# Patient Record
Sex: Female | Born: 1955 | Race: Black or African American | Hispanic: No | Marital: Married | Smoking: Never smoker
Health system: Southern US, Community
[De-identification: ages and names within clinical notes are randomized; demographics above are authoritative.]

## PROBLEM LIST (undated history)

## (undated) DIAGNOSIS — G5793 Unspecified mononeuropathy of bilateral lower limbs: Secondary | ICD-10-CM

## (undated) DIAGNOSIS — C801 Malignant (primary) neoplasm, unspecified: Secondary | ICD-10-CM

## (undated) DIAGNOSIS — R600 Localized edema: Secondary | ICD-10-CM

## (undated) DIAGNOSIS — K567 Ileus, unspecified: Secondary | ICD-10-CM

## (undated) DIAGNOSIS — R188 Other ascites: Secondary | ICD-10-CM

## (undated) DIAGNOSIS — E7800 Pure hypercholesterolemia, unspecified: Secondary | ICD-10-CM

## (undated) DIAGNOSIS — I10 Essential (primary) hypertension: Secondary | ICD-10-CM

## (undated) DIAGNOSIS — K219 Gastro-esophageal reflux disease without esophagitis: Secondary | ICD-10-CM

## (undated) DIAGNOSIS — M722 Plantar fascial fibromatosis: Secondary | ICD-10-CM

## (undated) HISTORY — DX: Plantar fascial fibromatosis: M72.2

## (undated) HISTORY — PX: HAND LIGAMENT RECONSTRUCTION: SHX1726

## (undated) HISTORY — DX: Localized edema: R60.0

---

## 2002-07-30 ENCOUNTER — Emergency Department (HOSPITAL_COMMUNITY): Admission: EM | Admit: 2002-07-30 | Discharge: 2002-07-30 | Payer: Self-pay | Admitting: Emergency Medicine

## 2002-07-30 ENCOUNTER — Encounter: Payer: Self-pay | Admitting: Emergency Medicine

## 2004-09-07 ENCOUNTER — Emergency Department (HOSPITAL_COMMUNITY): Admission: EM | Admit: 2004-09-07 | Discharge: 2004-09-08 | Payer: Self-pay | Admitting: Emergency Medicine

## 2010-02-02 ENCOUNTER — Emergency Department (HOSPITAL_COMMUNITY): Admission: EM | Admit: 2010-02-02 | Discharge: 2010-02-02 | Payer: Self-pay | Admitting: Emergency Medicine

## 2010-09-22 ENCOUNTER — Ambulatory Visit: Payer: Self-pay

## 2011-01-15 ENCOUNTER — Emergency Department (HOSPITAL_COMMUNITY)
Admission: EM | Admit: 2011-01-15 | Discharge: 2011-01-15 | Disposition: A | Discharge status: Home / Self Care | Payer: BC Managed Care – PPO | Attending: Emergency Medicine | Admitting: Emergency Medicine

## 2011-01-15 DIAGNOSIS — H9209 Otalgia, unspecified ear: Secondary | ICD-10-CM | POA: Insufficient documentation

## 2011-01-15 DIAGNOSIS — J329 Chronic sinusitis, unspecified: Secondary | ICD-10-CM | POA: Insufficient documentation

## 2011-01-15 DIAGNOSIS — J3489 Other specified disorders of nose and nasal sinuses: Secondary | ICD-10-CM | POA: Insufficient documentation

## 2011-01-15 DIAGNOSIS — R070 Pain in throat: Secondary | ICD-10-CM | POA: Insufficient documentation

## 2011-01-15 DIAGNOSIS — R51 Headache: Secondary | ICD-10-CM | POA: Insufficient documentation

## 2011-01-15 DIAGNOSIS — H5789 Other specified disorders of eye and adnexa: Secondary | ICD-10-CM | POA: Insufficient documentation

## 2011-01-15 DIAGNOSIS — Z79899 Other long term (current) drug therapy: Secondary | ICD-10-CM | POA: Insufficient documentation

## 2011-01-15 DIAGNOSIS — I10 Essential (primary) hypertension: Secondary | ICD-10-CM | POA: Insufficient documentation

## 2011-01-15 DIAGNOSIS — E119 Type 2 diabetes mellitus without complications: Secondary | ICD-10-CM | POA: Insufficient documentation

## 2011-01-15 LAB — RAPID STREP SCREEN (MED CTR MEBANE ONLY): Streptococcus, Group A Screen (Direct): NEGATIVE

## 2011-11-09 ENCOUNTER — Encounter (HOSPITAL_COMMUNITY): Payer: Self-pay | Admitting: Emergency Medicine

## 2011-11-09 ENCOUNTER — Emergency Department (HOSPITAL_COMMUNITY)
Admission: EM | Admit: 2011-11-09 | Discharge: 2011-11-09 | Disposition: A | Discharge status: Home / Self Care | Payer: BC Managed Care – PPO | Attending: Emergency Medicine | Admitting: Emergency Medicine

## 2011-11-09 DIAGNOSIS — E119 Type 2 diabetes mellitus without complications: Secondary | ICD-10-CM | POA: Insufficient documentation

## 2011-11-09 DIAGNOSIS — M792 Neuralgia and neuritis, unspecified: Secondary | ICD-10-CM

## 2011-11-09 DIAGNOSIS — Z79899 Other long term (current) drug therapy: Secondary | ICD-10-CM | POA: Insufficient documentation

## 2011-11-09 DIAGNOSIS — I10 Essential (primary) hypertension: Secondary | ICD-10-CM | POA: Insufficient documentation

## 2011-11-09 DIAGNOSIS — E7800 Pure hypercholesterolemia, unspecified: Secondary | ICD-10-CM | POA: Insufficient documentation

## 2011-11-09 DIAGNOSIS — IMO0002 Reserved for concepts with insufficient information to code with codable children: Secondary | ICD-10-CM | POA: Insufficient documentation

## 2011-11-09 DIAGNOSIS — R079 Chest pain, unspecified: Secondary | ICD-10-CM | POA: Insufficient documentation

## 2011-11-09 DIAGNOSIS — R0602 Shortness of breath: Secondary | ICD-10-CM | POA: Insufficient documentation

## 2011-11-09 HISTORY — DX: Pure hypercholesterolemia, unspecified: E78.00

## 2011-11-09 HISTORY — DX: Essential (primary) hypertension: I10

## 2011-11-09 LAB — BASIC METABOLIC PANEL
BUN: 11 mg/dL (ref 6–23)
Chloride: 94 mEq/L — ABNORMAL LOW (ref 96–112)
Glucose, Bld: 127 mg/dL — ABNORMAL HIGH (ref 70–99)
Potassium: 3.3 mEq/L — ABNORMAL LOW (ref 3.5–5.1)

## 2011-11-09 LAB — PRO B NATRIURETIC PEPTIDE: Pro B Natriuretic peptide (BNP): 9.1 pg/mL (ref 0–125)

## 2011-11-09 LAB — CBC
HCT: 39.6 % (ref 36.0–46.0)
Hemoglobin: 14.2 g/dL (ref 12.0–15.0)
RBC: 4.59 MIL/uL (ref 3.87–5.11)
WBC: 5.7 10*3/uL (ref 4.0–10.5)

## 2011-11-09 LAB — GLUCOSE, CAPILLARY: Glucose-Capillary: 137 mg/dL — ABNORMAL HIGH (ref 70–99)

## 2011-11-09 MED ORDER — PREDNISONE 20 MG PO TABS: 20.0000 mg | ORAL_TABLET | Freq: Every day | ORAL | Status: AC

## 2011-11-09 MED ORDER — HYDROCODONE-ACETAMINOPHEN 5-500 MG PO TABS: 1.0000 | ORAL_TABLET | Freq: Four times a day (QID) | ORAL | Status: AC | PRN

## 2011-11-09 NOTE — ED Notes (Signed)
Pt c/o left sided arm and neck pain with pain in upper back and into chest x 3 weeks; pt sts became more severe today with some hand numbness; no weakness in grip strength

## 2011-11-09 NOTE — ED Notes (Signed)
Pt states that she has had numb fingers on her L hand for several months.  The week she began experiencing L shoulder "throbbing" that sometimes radiated down to her L elbow and up into her L neck.  Pain does not increase with movement and is not relieved with 1600 mg of ibuprofen.  Strong grips on each side.  Pt does not feel sob is r/t increased pain.

## 2011-11-09 NOTE — Discharge Instructions (Signed)
Be sure to check your sugars more frequently while on the prednisone as this can increase your sugars.

## 2011-11-09 NOTE — ED Provider Notes (Signed)
History     CSN: [id number]  Arrival date & time 11/09/11  1559   First MD Initiated Contact with Patient 11/09/11 2113      Chief Complaint  Patient presents with  . Chest Pain  . Arm Pain    (Consider location/radiation/quality/duration/timing/severity/associated sxs/prior treatment) Patient is a 56 y.o. female presenting with chest pain.  Chest Pain The chest pain began 6 - 12 hours ago. Chest pain occurs frequently. The chest pain is unchanged. The pain is currently at 5/10. The severity of the pain is moderate. The quality of the pain is described as squeezing. The pain does not radiate. Primary symptoms include shortness of breath and cough. Pertinent negatives for primary symptoms include no fever, no abdominal pain, no nausea and no vomiting.  Pertinent negatives for associated symptoms include no weakness. She tried nothing for the symptoms.     Past Medical History  Diagnosis Date  . Diabetes mellitus   . Hypertension   . Hypercholesterolemia     History reviewed. No pertinent past surgical history.  History reviewed. No pertinent family history.  History  Substance Use Topics  . Smoking status: Never Smoker   . Smokeless tobacco: Not on file  . Alcohol Use: No    OB History    Grav Para Term Preterm Abortions TAB SAB Ect Mult Living                  Review of Systems  Constitutional: Negative for fever and chills.  HENT: Negative for congestion and rhinorrhea.   Respiratory: Positive for cough and shortness of breath.   Cardiovascular: Positive for chest pain. Negative for leg swelling.  Gastrointestinal: Negative for nausea, vomiting, abdominal pain and constipation.  Genitourinary: Negative for urgency, decreased urine volume and difficulty urinating.  Skin: Negative for wound.  Neurological: Negative for weakness, light-headedness and headaches.       Tingling throughout left arm  Psychiatric/Behavioral: Negative for confusion.  All other  systems reviewed and are negative.    Allergies  Review of patient's allergies indicates no known allergies.  Home Medications   Current Outpatient Rx  Name Route Sig Dispense Refill  . ATORVASTATIN CALCIUM 20 MG PO TABS Oral Take 20 mg by mouth daily.    Marland Kitchen EXENATIDE 2 MG [place] SUSR Subcutaneous Inject 2 mg into the skin once a week. Every monday    . GLIMEPIRIDE 4 MG PO TABS Oral Take 4 mg by mouth daily before breakfast.    . IBUPROFEN 200 MG PO TABS Oral Take 600 mg by mouth every 6 (six) hours as needed. For pain    . INSULIN GLARGINE 100 UNIT/ML  SOLN Subcutaneous Inject 44 Units into the skin 2 (two) times daily.    Marland Kitchen LISINOPRIL-HYDROCHLOROTHIAZIDE 20-25 MG PO TABS Oral Take 1 tablet by mouth daily.      BP 120/77  Pulse 84  Temp(Src) 98.4 F (36.9 C) (Oral)  Resp 20  SpO2 98%  Physical Exam  Vitals reviewed. Constitutional: She is oriented to person, place, and time. She appears well-developed and well-nourished. No distress.  HENT:  Head: Normocephalic and atraumatic.  Right Ear: External ear normal.  Left Ear: External ear normal.  Nose: Nose normal.  Mouth/Throat: Oropharynx is clear and moist.  Neck: Neck supple.  Cardiovascular: Normal rate, regular rhythm, normal heart sounds and intact distal pulses.   Pulmonary/Chest: Effort normal and breath sounds normal. No respiratory distress. She has no wheezes. She has no rales. She exhibits no  tenderness.  Abdominal: Soft. She exhibits no distension. There is no tenderness.  Musculoskeletal: She exhibits no edema.       Left shoulder: She exhibits tenderness. She exhibits no bony tenderness.       Left elbow: She exhibits normal range of motion, no swelling and no deformity. tenderness (soft tissue) found.       Left hand: decreased sensation noted. Decreased sensation is present in the ulnar distribution and is present in the medial distribution.  Lymphadenopathy:    She has no cervical adenopathy.  Neurological:  She is alert and oriented to person, place, and time.  Skin: Skin is warm and dry. She is not diaphoretic. No pallor.    ED Course  Procedures (including critical care time)  Labs Reviewed  BASIC METABOLIC PANEL - Abnormal; Notable for the following:    Sodium 134 (*)    Potassium 3.3 (*)    Chloride 94 (*)    Glucose, Bld 127 (*)    All other components within normal limits  GLUCOSE, CAPILLARY - Abnormal; Notable for the following:    Glucose-Capillary 137 (*)    All other components within normal limits  CBC  TROPONIN I  PRO B NATRIURETIC PEPTIDE  PRO B NATRIURETIC PEPTIDE   No results found.   Date: 11/09/2011  Rate: 99  Rhythm: normal sinus rhythm  QRS Axis: normal  Intervals: normal  ST/T Wave abnormalities: T flattening in V4-V6  Conduction Disutrbances:none  Narrative Interpretation:   Old EKG Reviewed: unchanged   1. Radicular pain in left arm       MDM  56 yo female with left arm/shoulder pain x 2 weeks, now c/o chest pain intermittently today. Not exertional. CP worse with left upper extremity movement. Intermittent left finger numbness (worse in medial 2 digits, but occasionally from index finger to pinky). Mild neck pain as well. EKG shows no ischemia, and with over 8 hours of pain and a neg trop, doubt ACS. Likely related to her left arm pain, which seems c/w cervical radiculopathy with sensory changes, intermittent pain and numbness. No acute indication to image neck, strength intact. Will d/c home with steroid burst (alerted patient to pay attention to glucose due to being a diabetic) and po pain meds. Discussed follow up with her PCP for further workup for cervical radiculopathy.        Pricilla Loveless, MD 11/09/11 [phone]

## 2011-11-10 NOTE — ED Provider Notes (Signed)
I saw and evaluated the patient, reviewed the resident's note and I agree with the findings and plan.  I saw the patient along with Dr. Criss Alvine and agree with his noted, assessment, and plan.  The patient presents complaining of numbness in the left hand and arm that has been occurring for the past 2-3 weeks.  She denies chest pain or shortness of breath.  She does report some neck discomfort.  On exam, the ekg and enzymes were unremarkable.  The strength was symmetrical in both arms and there were no neuro deficits.  I believe the patient's symptoms are more likely radicular in nature and not cardiac.  She will be treated with prednisone as an anti-inflammatory as well as pain medication and given time.  If she does not improve in one week, or her symptoms change, she is to either follow up with her doctor or return to the ED.  Geoffery Lyons, MD 11/10/11 1246

## 2015-08-22 ENCOUNTER — Encounter (HOSPITAL_COMMUNITY): Payer: Self-pay | Admitting: Emergency Medicine

## 2015-08-22 ENCOUNTER — Emergency Department (HOSPITAL_COMMUNITY): Payer: BC Managed Care – PPO

## 2015-08-22 ENCOUNTER — Emergency Department (HOSPITAL_COMMUNITY)
Admission: EM | Admit: 2015-08-22 | Discharge: 2015-08-22 | Disposition: A | Discharge status: Home / Self Care | Payer: BC Managed Care – PPO | Attending: Emergency Medicine | Admitting: Emergency Medicine

## 2015-08-22 DIAGNOSIS — S0993XA Unspecified injury of face, initial encounter: Secondary | ICD-10-CM | POA: Diagnosis not present

## 2015-08-22 DIAGNOSIS — Y998 Other external cause status: Secondary | ICD-10-CM | POA: Insufficient documentation

## 2015-08-22 DIAGNOSIS — Z79899 Other long term (current) drug therapy: Secondary | ICD-10-CM | POA: Diagnosis not present

## 2015-08-22 DIAGNOSIS — S79922A Unspecified injury of left thigh, initial encounter: Secondary | ICD-10-CM | POA: Diagnosis not present

## 2015-08-22 DIAGNOSIS — Y92410 Unspecified street and highway as the place of occurrence of the external cause: Secondary | ICD-10-CM | POA: Diagnosis not present

## 2015-08-22 DIAGNOSIS — Z7984 Long term (current) use of oral hypoglycemic drugs: Secondary | ICD-10-CM | POA: Diagnosis not present

## 2015-08-22 DIAGNOSIS — I10 Essential (primary) hypertension: Secondary | ICD-10-CM | POA: Diagnosis not present

## 2015-08-22 DIAGNOSIS — Y9389 Activity, other specified: Secondary | ICD-10-CM | POA: Insufficient documentation

## 2015-08-22 DIAGNOSIS — M79604 Pain in right leg: Secondary | ICD-10-CM

## 2015-08-22 DIAGNOSIS — E119 Type 2 diabetes mellitus without complications: Secondary | ICD-10-CM | POA: Diagnosis not present

## 2015-08-22 DIAGNOSIS — S6991XA Unspecified injury of right wrist, hand and finger(s), initial encounter: Secondary | ICD-10-CM | POA: Insufficient documentation

## 2015-08-22 DIAGNOSIS — M79641 Pain in right hand: Secondary | ICD-10-CM

## 2015-08-22 DIAGNOSIS — Z794 Long term (current) use of insulin: Secondary | ICD-10-CM | POA: Diagnosis not present

## 2015-08-22 MED ORDER — HYDROCODONE-ACETAMINOPHEN 5-325 MG PO TABS: 1.0000 | ORAL_TABLET | Freq: Four times a day (QID) | ORAL | Status: DC | PRN

## 2015-08-22 MED ORDER — DIAZEPAM 2 MG PO TABS: 2.0000 mg | ORAL_TABLET | Freq: Three times a day (TID) | ORAL | Status: DC | PRN

## 2015-08-22 MED ORDER — DIAZEPAM 2 MG PO TABS
2.0000 mg | ORAL_TABLET | Freq: Once | ORAL | Status: AC
  Administered 2015-08-22: 2 mg via ORAL
  Filled 2015-08-22: qty 1

## 2015-08-22 MED ORDER — HYDROCODONE-ACETAMINOPHEN 5-325 MG PO TABS
1.0000 | ORAL_TABLET | Freq: Once | ORAL | Status: AC
  Administered 2015-08-22: 1 via ORAL
  Filled 2015-08-22: qty 1
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
## 2015-08-28 ENCOUNTER — Emergency Department (HOSPITAL_COMMUNITY): Payer: BC Managed Care – PPO

## 2015-08-28 ENCOUNTER — Emergency Department (HOSPITAL_COMMUNITY)
Admission: EM | Admit: 2015-08-28 | Discharge: 2015-08-28 | Disposition: A | Discharge status: Home / Self Care | Payer: BC Managed Care – PPO | Attending: Emergency Medicine | Admitting: Emergency Medicine

## 2015-08-28 ENCOUNTER — Encounter (HOSPITAL_COMMUNITY): Payer: Self-pay

## 2015-08-28 DIAGNOSIS — Y9389 Activity, other specified: Secondary | ICD-10-CM | POA: Insufficient documentation

## 2015-08-28 DIAGNOSIS — Y92410 Unspecified street and highway as the place of occurrence of the external cause: Secondary | ICD-10-CM | POA: Diagnosis not present

## 2015-08-28 DIAGNOSIS — S60221A Contusion of right hand, initial encounter: Secondary | ICD-10-CM | POA: Insufficient documentation

## 2015-08-28 DIAGNOSIS — Y998 Other external cause status: Secondary | ICD-10-CM | POA: Diagnosis not present

## 2015-08-28 DIAGNOSIS — Z79899 Other long term (current) drug therapy: Secondary | ICD-10-CM | POA: Diagnosis not present

## 2015-08-28 DIAGNOSIS — E119 Type 2 diabetes mellitus without complications: Secondary | ICD-10-CM | POA: Insufficient documentation

## 2015-08-28 DIAGNOSIS — M25441 Effusion, right hand: Secondary | ICD-10-CM

## 2015-08-28 DIAGNOSIS — Z794 Long term (current) use of insulin: Secondary | ICD-10-CM | POA: Insufficient documentation

## 2015-08-28 DIAGNOSIS — I10 Essential (primary) hypertension: Secondary | ICD-10-CM | POA: Diagnosis not present

## 2015-08-28 DIAGNOSIS — S6991XA Unspecified injury of right wrist, hand and finger(s), initial encounter: Secondary | ICD-10-CM | POA: Diagnosis present

## 2015-08-28 MED ORDER — OXYCODONE-ACETAMINOPHEN 5-325 MG PO TABS: 1.0000 | ORAL_TABLET | ORAL | Status: DC | PRN
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
## 2015-08-28 NOTE — ED Notes (Signed)
Patient was alert, oriented and stable upon discharge. RN went over AVS and patient had no further questions.  
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
## 2015-08-28 NOTE — Discharge Instructions (Signed)
Continue to keep her hand elevated and ice several times a day. Avoid any activity with the right hand. Ibuprofen for pain. Take Percocet for severe pain as prescribed as needed only. Please follow-up with a hand specialist as referred.  Hand Contusion A hand contusion is a deep bruise on your hand area. Contusions are the result of an injury that caused bleeding under the skin. The contusion may turn blue, purple, or yellow. Minor injuries will give you a painless contusion, but more severe contusions may stay painful and swollen for a few weeks. CAUSES  A contusion is usually caused by a blow, trauma, or direct force to an area of the body. SYMPTOMS   Swelling and redness of the injured area.  Discoloration of the injured area.  Tenderness and soreness of the injured area.  Pain. DIAGNOSIS  The diagnosis can be made by taking a history and performing a physical exam. An X-ray, CT scan, or MRI may be needed to determine if there were any associated injuries, such as broken bones (fractures). TREATMENT  Often, the best treatment for a hand contusion is resting, elevating, icing, and applying cold compresses to the injured area. Over-the-counter medicines may also be recommended for pain control. HOME CARE INSTRUCTIONS   Put ice on the injured area.  Put ice in a plastic bag.  Place a towel between your skin and the bag.  Leave the ice on for 15-20 minutes, 03-04 times a day.  Only take over-the-counter or prescription medicines as directed by your caregiver. Your caregiver may recommend avoiding anti-inflammatory medicines (aspirin, ibuprofen, and naproxen) for 48 hours because these medicines may increase bruising.  If told, use an elastic wrap as directed. This can help reduce swelling. You may remove the wrap for sleeping, showering, and bathing. If your fingers become numb, cold, or blue, take the wrap off and reapply it more loosely.  Elevate your hand with pillows to reduce  swelling.  Avoid overusing your hand if it is painful. SEEK IMMEDIATE MEDICAL CARE IF:   You have increased redness, swelling, or pain in your hand.  Your swelling or pain is not relieved with medicines.  You have loss of feeling in your hand or are unable to move your fingers.  Your hand turns cold or blue.  You have pain when you move your fingers.  Your hand becomes warm to the touch.  Your contusion does not improve in 2 days. MAKE SURE YOU:   Understand these instructions.  Will watch your condition.  Will get help right away if you are not doing well or get worse.   This information is not intended to replace advice given to you by your health care provider. Make sure you discuss any questions you have with your health care provider.   Document Released: 01/23/2002 Document Revised: 04/27/2012 Document Reviewed: 01/25/2012 Elsevier Interactive Patient Education [employer].

## 2017-08-18 ENCOUNTER — Encounter (HOSPITAL_COMMUNITY): Payer: Self-pay | Admitting: Emergency Medicine

## 2017-08-18 ENCOUNTER — Emergency Department (HOSPITAL_COMMUNITY): Payer: BC Managed Care – PPO

## 2017-08-18 DIAGNOSIS — K219 Gastro-esophageal reflux disease without esophagitis: Secondary | ICD-10-CM | POA: Insufficient documentation

## 2017-08-18 DIAGNOSIS — R911 Solitary pulmonary nodule: Secondary | ICD-10-CM | POA: Insufficient documentation

## 2017-08-18 DIAGNOSIS — R079 Chest pain, unspecified: Secondary | ICD-10-CM | POA: Diagnosis not present

## 2017-08-18 DIAGNOSIS — Z794 Long term (current) use of insulin: Secondary | ICD-10-CM | POA: Diagnosis not present

## 2017-08-18 DIAGNOSIS — Z6839 Body mass index (BMI) 39.0-39.9, adult: Secondary | ICD-10-CM | POA: Insufficient documentation

## 2017-08-18 DIAGNOSIS — E785 Hyperlipidemia, unspecified: Secondary | ICD-10-CM | POA: Insufficient documentation

## 2017-08-18 DIAGNOSIS — E669 Obesity, unspecified: Secondary | ICD-10-CM | POA: Insufficient documentation

## 2017-08-18 DIAGNOSIS — R0609 Other forms of dyspnea: Secondary | ICD-10-CM | POA: Insufficient documentation

## 2017-08-18 DIAGNOSIS — E1140 Type 2 diabetes mellitus with diabetic neuropathy, unspecified: Secondary | ICD-10-CM | POA: Diagnosis not present

## 2017-08-18 DIAGNOSIS — M7989 Other specified soft tissue disorders: Secondary | ICD-10-CM | POA: Insufficient documentation

## 2017-08-18 DIAGNOSIS — Z8701 Personal history of pneumonia (recurrent): Secondary | ICD-10-CM | POA: Insufficient documentation

## 2017-08-18 DIAGNOSIS — E1165 Type 2 diabetes mellitus with hyperglycemia: Secondary | ICD-10-CM | POA: Diagnosis not present

## 2017-08-18 DIAGNOSIS — Z79899 Other long term (current) drug therapy: Secondary | ICD-10-CM | POA: Insufficient documentation

## 2017-08-18 DIAGNOSIS — Z7982 Long term (current) use of aspirin: Secondary | ICD-10-CM | POA: Insufficient documentation

## 2017-08-18 DIAGNOSIS — I10 Essential (primary) hypertension: Secondary | ICD-10-CM | POA: Diagnosis not present

## 2017-08-18 LAB — BASIC METABOLIC PANEL
Anion gap: 12 (ref 5–15)
BUN: 21 mg/dL — AB (ref 6–20)
CALCIUM: 9.3 mg/dL (ref 8.9–10.3)
CO2: 27 mmol/L (ref 22–32)
CREATININE: 1.02 mg/dL — AB (ref 0.44–1.00)
Chloride: 92 mmol/L — ABNORMAL LOW (ref 101–111)
GFR calc Af Amer: >60 mL/min (ref >60)
GFR, EST NON AFRICAN AMERICAN: 59 mL/min — AB (ref >60)
GLUCOSE: 371 mg/dL — AB (ref 65–99)
Potassium: 3.7 mmol/L (ref 3.5–5.1)
SODIUM: 131 mmol/L — AB (ref 135–145)

## 2017-08-18 LAB — D-DIMER, QUANTITATIVE: D-Dimer, Quant: 0.61 ug/mL-FEU — ABNORMAL HIGH (ref 0.00–0.50)

## 2017-08-18 LAB — CBC
HCT: 40.8 % (ref 36.0–46.0)
Hemoglobin: 13.6 g/dL (ref 12.0–15.0)
MCH: 28.8 pg (ref 26.0–34.0)
MCHC: 33.3 g/dL (ref 30.0–36.0)
MCV: 86.3 fL (ref 78.0–100.0)
PLATELETS: 193 10*3/uL (ref 150–400)
RBC: 4.73 MIL/uL (ref 3.87–5.11)
RDW: 13 % (ref 11.5–15.5)
WBC: 5.2 10*3/uL (ref 4.0–10.5)

## 2017-08-18 LAB — I-STAT TROPONIN, ED: TROPONIN I, POC: 0.01 ng/mL (ref 0.00–0.08)

## 2017-08-18 NOTE — ED Triage Notes (Signed)
Pt to ED from home with multiple complaints - pt states she has had orthopnea, chronic cough, swelling in lower extremities, and SOB intermittently for months now. Patient was seen on the 18th at an urgent care and had blood work done - she received a call stating her D-dimer was 0.74 and to go to the ED. Patient denies active CP/SOB, but reports L arm discomfort which comes and goes as well. Resp e/u, skin warm/dry.

## 2017-08-19 ENCOUNTER — Other Ambulatory Visit (HOSPITAL_COMMUNITY): Payer: BC Managed Care – PPO

## 2017-08-19 ENCOUNTER — Emergency Department (HOSPITAL_COMMUNITY): Payer: BC Managed Care – PPO

## 2017-08-19 ENCOUNTER — Observation Stay (HOSPITAL_COMMUNITY)
Admission: EM | Admit: 2017-08-19 | Discharge: 2017-08-20 | Disposition: A | Discharge status: Home / Self Care | Payer: BC Managed Care – PPO | Attending: Internal Medicine | Admitting: Internal Medicine

## 2017-08-19 ENCOUNTER — Other Ambulatory Visit: Payer: Self-pay

## 2017-08-19 ENCOUNTER — Encounter (HOSPITAL_COMMUNITY): Payer: Self-pay | Admitting: Internal Medicine

## 2017-08-19 DIAGNOSIS — R079 Chest pain, unspecified: Principal | ICD-10-CM

## 2017-08-19 DIAGNOSIS — R0600 Dyspnea, unspecified: Secondary | ICD-10-CM | POA: Diagnosis not present

## 2017-08-19 DIAGNOSIS — R0609 Other forms of dyspnea: Secondary | ICD-10-CM

## 2017-08-19 DIAGNOSIS — I10 Essential (primary) hypertension: Secondary | ICD-10-CM

## 2017-08-19 DIAGNOSIS — E1165 Type 2 diabetes mellitus with hyperglycemia: Secondary | ICD-10-CM | POA: Diagnosis not present

## 2017-08-19 HISTORY — DX: Chest pain, unspecified: R07.9

## 2017-08-19 LAB — CBC WITH DIFFERENTIAL/PLATELET
BASOS ABS: 0 10*3/uL (ref 0.0–0.1)
BASOS PCT: 0 %
EOS ABS: 0.1 10*3/uL (ref 0.0–0.7)
EOS PCT: 2 %
HCT: 40.7 % (ref 36.0–46.0)
Hemoglobin: 13.8 g/dL (ref 12.0–15.0)
Lymphocytes Relative: 55 %
Lymphs Abs: 3.1 10*3/uL (ref 0.7–4.0)
MCH: 29.7 pg (ref 26.0–34.0)
MCHC: 33.9 g/dL (ref 30.0–36.0)
MCV: 87.7 fL (ref 78.0–100.0)
MONO ABS: 0.4 10*3/uL (ref 0.1–1.0)
Monocytes Relative: 7 %
Neutro Abs: 2 10*3/uL (ref 1.7–7.7)
Neutrophils Relative %: 36 %
PLATELETS: 188 10*3/uL (ref 150–400)
RBC: 4.64 MIL/uL (ref 3.87–5.11)
RDW: 13.4 % (ref 11.5–15.5)
WBC: 5.6 10*3/uL (ref 4.0–10.5)

## 2017-08-19 LAB — COMPREHENSIVE METABOLIC PANEL
ALBUMIN: 3.7 g/dL (ref 3.5–5.0)
ALT: 38 U/L (ref 14–54)
AST: 46 U/L — AB (ref 15–41)
Alkaline Phosphatase: 79 U/L (ref 38–126)
Anion gap: 11 (ref 5–15)
BUN: 19 mg/dL (ref 6–20)
CHLORIDE: 98 mmol/L — AB (ref 101–111)
CO2: 25 mmol/L (ref 22–32)
Calcium: 9.4 mg/dL (ref 8.9–10.3)
Creatinine, Ser: 0.86 mg/dL (ref 0.44–1.00)
GFR calc Af Amer: >60 mL/min (ref >60)
Glucose, Bld: 225 mg/dL — ABNORMAL HIGH (ref 65–99)
POTASSIUM: 3.5 mmol/L (ref 3.5–5.1)
SODIUM: 134 mmol/L — AB (ref 135–145)
Total Bilirubin: 0.9 mg/dL (ref 0.3–1.2)
Total Protein: 7.7 g/dL (ref 6.5–8.1)

## 2017-08-19 LAB — CBC
HEMATOCRIT: 41.2 % (ref 36.0–46.0)
Hemoglobin: 14 g/dL (ref 12.0–15.0)
MCH: 29.8 pg (ref 26.0–34.0)
MCHC: 34 g/dL (ref 30.0–36.0)
MCV: 87.7 fL (ref 78.0–100.0)
Platelets: 179 10*3/uL (ref 150–400)
RBC: 4.7 MIL/uL (ref 3.87–5.11)
RDW: 13.3 % (ref 11.5–15.5)
WBC: 5.6 10*3/uL (ref 4.0–10.5)

## 2017-08-19 LAB — GLUCOSE, CAPILLARY
GLUCOSE-CAPILLARY: 191 mg/dL — AB (ref 65–99)
GLUCOSE-CAPILLARY: 255 mg/dL — AB (ref 65–99)
Glucose-Capillary: 234 mg/dL — ABNORMAL HIGH (ref 65–99)
Glucose-Capillary: 230 mg/dL — ABNORMAL HIGH (ref 65–99)

## 2017-08-19 LAB — CREATININE, SERUM
Creatinine, Ser: 0.83 mg/dL (ref 0.44–1.00)
GFR calc Af Amer: >60 mL/min (ref >60)
GFR calc non Af Amer: >60 mL/min (ref >60)

## 2017-08-19 LAB — TROPONIN I

## 2017-08-19 LAB — MAGNESIUM: Magnesium: 2.1 mg/dL (ref 1.7–2.4)

## 2017-08-19 LAB — BRAIN NATRIURETIC PEPTIDE: B Natriuretic Peptide: 5.9 pg/mL (ref 0.0–100.0)

## 2017-08-19 LAB — CBG MONITORING, ED: Glucose-Capillary: 309 mg/dL — ABNORMAL HIGH (ref 65–99)

## 2017-08-19 LAB — PHOSPHORUS: PHOSPHORUS: 3.7 mg/dL (ref 2.5–4.6)

## 2017-08-19 LAB — HIV ANTIBODY (ROUTINE TESTING W REFLEX): HIV Screen 4th Generation wRfx: NONREACTIVE

## 2017-08-19 MED ORDER — INSULIN NPH (HUMAN) (ISOPHANE) 100 UNIT/ML ~~LOC~~ SUSP: 40.0000 [IU] | Freq: Every day | SUBCUTANEOUS | Status: DC

## 2017-08-19 MED ORDER — ASPIRIN 81 MG PO CHEW
324.0000 mg | CHEWABLE_TABLET | Freq: Once | ORAL | Status: AC
  Administered 2017-08-19: 324 mg via ORAL
  Filled 2017-08-19: qty 4

## 2017-08-19 MED ORDER — ASPIRIN EC 325 MG PO TBEC: 325.0000 mg | DELAYED_RELEASE_TABLET | Freq: Every day | ORAL | Status: DC

## 2017-08-19 MED ORDER — INSULIN ASPART 100 UNIT/ML ~~LOC~~ SOLN
8.0000 [IU] | Freq: Once | SUBCUTANEOUS | Status: AC
  Administered 2017-08-19: 8 [IU] via SUBCUTANEOUS
  Filled 2017-08-19: qty 1

## 2017-08-19 MED ORDER — ONDANSETRON HCL 4 MG/2ML IJ SOLN: 4.0000 mg | Freq: Four times a day (QID) | INTRAMUSCULAR | Status: DC | PRN

## 2017-08-19 MED ORDER — INSULIN NPH (HUMAN) (ISOPHANE) 100 UNIT/ML ~~LOC~~ SUSP
75.0000 [IU] | Freq: Every day | SUBCUTANEOUS | Status: DC
  Filled 2017-08-19: qty 10

## 2017-08-19 MED ORDER — LOSARTAN POTASSIUM-HCTZ 100-25 MG PO TABS: 1.0000 | ORAL_TABLET | Freq: Every day | ORAL | Status: DC

## 2017-08-19 MED ORDER — INSULIN ASPART PROT & ASPART (70-30 MIX) 100 UNIT/ML ~~LOC~~ SUSP: 75.0000 [IU] | Freq: Three times a day (TID) | SUBCUTANEOUS | Status: DC

## 2017-08-19 MED ORDER — INSULIN NPH (HUMAN) (ISOPHANE) 100 UNIT/ML ~~LOC~~ SUSP
40.0000 [IU] | Freq: Every day | SUBCUTANEOUS | Status: DC
Start: 2017-08-19 — End: 2017-08-20
  Administered 2017-08-20: 40 [IU] via SUBCUTANEOUS

## 2017-08-19 MED ORDER — ENOXAPARIN SODIUM 40 MG/0.4ML ~~LOC~~ SOLN
40.0000 mg | SUBCUTANEOUS | Status: DC
  Administered 2017-08-19 – 2017-08-20 (×2): 40 mg via SUBCUTANEOUS
  Filled 2017-08-19 (×2): qty 0.4

## 2017-08-19 MED ORDER — ACETAMINOPHEN 325 MG PO TABS: 650.0000 mg | ORAL_TABLET | ORAL | Status: DC | PRN

## 2017-08-19 MED ORDER — HYDROCHLOROTHIAZIDE 25 MG PO TABS: 25.0000 mg | ORAL_TABLET | Freq: Every day | ORAL | Status: DC

## 2017-08-19 MED ORDER — LOSARTAN POTASSIUM 50 MG PO TABS
100.0000 mg | ORAL_TABLET | Freq: Every day | ORAL | Status: DC
  Administered 2017-08-19 – 2017-08-20 (×2): 100 mg via ORAL
  Filled 2017-08-19 (×2): qty 2

## 2017-08-19 MED ORDER — INSULIN ASPART 100 UNIT/ML ~~LOC~~ SOLN
0.0000 [IU] | Freq: Three times a day (TID) | SUBCUTANEOUS | Status: DC
  Administered 2017-08-19: 3 [IU] via SUBCUTANEOUS
  Administered 2017-08-19: 2 [IU] via SUBCUTANEOUS
  Administered 2017-08-19: 3 [IU] via SUBCUTANEOUS
  Administered 2017-08-20: 5 [IU] via SUBCUTANEOUS
  Administered 2017-08-20: 7 [IU] via SUBCUTANEOUS

## 2017-08-19 MED ORDER — AMITRIPTYLINE HCL 25 MG PO TABS
25.0000 mg | ORAL_TABLET | Freq: Every day | ORAL | Status: DC
  Administered 2017-08-19: 25 mg via ORAL
  Filled 2017-08-19: qty 1

## 2017-08-19 MED ORDER — PREGABALIN 50 MG PO CAPS
225.0000 mg | ORAL_CAPSULE | Freq: Every day | ORAL | Status: DC
  Administered 2017-08-19: 225 mg via ORAL
  Filled 2017-08-19: qty 1

## 2017-08-19 MED ORDER — ASPIRIN EC 81 MG PO TBEC
81.0000 mg | DELAYED_RELEASE_TABLET | Freq: Every day | ORAL | Status: DC
  Administered 2017-08-20: 81 mg via ORAL
  Filled 2017-08-19: qty 1

## 2017-08-19 MED ORDER — INSULIN NPH (HUMAN) (ISOPHANE) 100 UNIT/ML ~~LOC~~ SUSP
75.0000 [IU] | Freq: Two times a day (BID) | SUBCUTANEOUS | Status: DC
  Administered 2017-08-19 – 2017-08-20 (×2): 75 [IU] via SUBCUTANEOUS
  Filled 2017-08-19: qty 10

## 2017-08-19 MED ORDER — IOPAMIDOL (ISOVUE-370) INJECTION 76%
INTRAVENOUS | Status: AC
  Administered 2017-08-19: 50 mL
  Filled 2017-08-19: qty 100

## 2017-08-19 NOTE — H&P (Signed)
History and Physical    Christy Carpenter NLZ:[id number] DOB: 1956-04-09 DOA: 08/19/2017  PCP: Patient, No Pcp Per  Patient coming from: Home.  Chief Complaint: Chest pain and shortness of breath.  HPI: Christy Carpenter is a 62 y.o. female with history of diabetes mellitus type 2, hypertension has been experiencing shortness of breath with nonproductive cough for last 2 weeks.  Patient had gone to urgent care center 2 weeks ago and was prescribed antibiotics for possible pneumonia.  Yesterday patient was having some left posterior chest pain with left pain and had gone to urgent care center where d-dimer was positive and was referred to the ER.  Denies any nausea vomiting abdominal pain fever chills diarrhea.  ED Course: In the ER a d-dimer was again repeated and was found to be elevated and patient had CT angiogram of the chest which was negative.  EKG was showing normal sinus rhythm with troponin negative BNP was around 5.9.  Patient is being admitted for further management of chest pain and shortness of breath.  Patient blood sugar is markedly elevated.  Review of Systems: As per HPI, rest all negative.   Past Medical History:  Diagnosis Date  . Diabetes mellitus   . Hypercholesterolemia   . Hypertension     Past Surgical History:  Procedure Laterality Date  . HAND LIGAMENT RECONSTRUCTION       reports that  has never smoked. she has never used smokeless tobacco. She reports that she does not drink alcohol or use drugs.  No Known Allergies  Family History  Problem Relation Age of Onset  . Diabetes Mellitus II Mother   . Stroke Mother   . Stroke Brother   . Diabetes Mellitus II Brother     Prior to Admission medications   Medication Sig Start Date End Date Taking? Authorizing Provider  acetaminophen (TYLENOL) 500 MG tablet Take 1,000 mg by mouth 2 (two) times daily as needed for moderate pain.    [provider]  amitriptyline (ELAVIL) 25 MG tablet Take 25 mg by mouth at  bedtime.    [provider]  diazepam (VALIUM) 2 MG tablet Take 1 tablet (2 mg total) by mouth every 8 (eight) hours as needed for anxiety. 08/22/15   Mesner, Corene Cornea, MD  HYDROcodone-acetaminophen (NORCO/VICODIN) 5-325 MG tablet Take 1 tablet by mouth every 6 (six) hours as needed for moderate pain. 08/22/15   Mesner, Corene Cornea, MD  ibuprofen (ADVIL,MOTRIN) 200 MG tablet Take 200 mg by mouth every 6 (six) hours as needed for moderate pain. For pain    [provider]  insulin aspart protamine- aspart (NOVOLOG MIX 70/30) (70-30) 100 UNIT/ML injection Inject 75 Units into the skin 3 (three) times daily.    [provider]  losartan-hydrochlorothiazide (HYZAAR) 100-25 MG tablet Take 1 tablet by mouth daily.    [provider]  metFORMIN (GLUCOPHAGE-XR) 750 MG 24 hr tablet Take 750 mg by mouth 2 (two) times daily.    [provider]  oxyCODONE-acetaminophen (PERCOCET) 5-325 MG tablet Take 1 tablet by mouth every 4 (four) hours as needed for severe pain. 08/28/15   Kirichenko, Tatyana, PA-C  pregabalin (LYRICA) 75 MG capsule Take 225 mg by mouth at bedtime.    [provider]    Physical Exam: Vitals:   08/19/17 0245 08/19/17 0300 08/19/17 0400 08/19/17 0408  BP: 123/71 (!) 119/55  (!) 144/62  Pulse: 99 99  93  Resp: 17 15    Temp:  98.4 F (36.9 C)  TempSrc:    Oral  SpO2: 98% 97%  96%  Weight:   92.1 kg (203 lb)   Height:   5' (1.524 m)       Constitutional: Moderately built and nourished. Vitals:   08/19/17 0245 08/19/17 0300 08/19/17 0400 08/19/17 0408  BP: 123/71 (!) 119/55  (!) 144/62  Pulse: 99 99  93  Resp: 17 15    Temp:    98.4 F (36.9 C)  TempSrc:    Oral  SpO2: 98% 97%  96%  Weight:   92.1 kg (203 lb)   Height:   5' (1.524 m)    Eyes: Anicteric no pallor. ENMT: No discharge from the ears eyes nose or mouth. Neck: No mass felt.  No neck rigidity.  No JVD appreciated. Respiratory: No rhonchi or  crepitations. Cardiovascular: S1-S2 heard no murmurs appreciated. Abdomen: Soft nontender bowel sounds present. Musculoskeletal: Mild edema. Skin: No rash. Neurologic: Alert awake oriented to time place and person.  Moves all extremities. Psychiatric: Appears normal.  Normal affect.   Labs on Admission: I have personally reviewed following labs and imaging studies  CBC: Recent Labs  Lab 08/18/17 2135  WBC 5.2  HGB 13.6  HCT 40.8  MCV 86.3  PLT 401   Basic Metabolic Panel: Recent Labs  Lab 08/18/17 2135  NA 131*  K 3.7  CL 92*  CO2 27  GLUCOSE 371*  BUN 21*  CREATININE 1.02*  CALCIUM 9.3   GFR: Estimated Creatinine Clearance: 60.1 mL/min (A) (by C-G formula based on SCr of 1.02 mg/dL (H)). Liver Function Tests: No results for input(s): AST, ALT, ALKPHOS, BILITOT, PROT, ALBUMIN in the last 168 hours. No results for input(s): LIPASE, AMYLASE in the last 168 hours. No results for input(s): AMMONIA in the last 168 hours. Coagulation Profile: No results for input(s): INR, PROTIME in the last 168 hours. Cardiac Enzymes: No results for input(s): CKTOTAL, CKMB, CKMBINDEX, TROPONINI in the last 168 hours. BNP (last 3 results) No results for input(s): PROBNP in the last 8760 hours. HbA1C: No results for input(s): HGBA1C in the last 72 hours. CBG: Recent Labs  Lab 08/19/17 0252  GLUCAP 309*   Lipid Profile: No results for input(s): CHOL, HDL, LDLCALC, TRIG, CHOLHDL, LDLDIRECT in the last 72 hours. Thyroid Function Tests: No results for input(s): TSH, T4TOTAL, FREET4, T3FREE, THYROIDAB in the last 72 hours. Anemia Panel: No results for input(s): VITAMINB12, FOLATE, FERRITIN, TIBC, IRON, RETICCTPCT in the last 72 hours. Urine analysis: No results found for: COLORURINE, APPEARANCEUR, LABSPEC, PHURINE, GLUCOSEU, HGBUR, BILIRUBINUR, KETONESUR, PROTEINUR, UROBILINOGEN, NITRITE, LEUKOCYTESUR Sepsis Labs: @LABRCNTIP (procalcitonin:4,lacticidven:4) )No results found for this  or any previous visit (from the past 240 hour(s)).   Radiological Exams on Admission: Dg Chest 2 View  Result Date: 08/18/2017 CLINICAL DATA:  Left shoulder pain with pain radiating down the left arm. Diagnosed with pneumonia in December 2018. Still on antibiotics. Swelling in the left foot. History of hypertension and diabetes. EXAM: CHEST  2 VIEW COMPARISON:  None. FINDINGS: The heart size and mediastinal contours are within normal limits. Both lungs are clear. The visualized skeletal structures are unremarkable. IMPRESSION: No active cardiopulmonary disease. Electronically Signed   By: Lucienne Capers M.D.   On: 08/18/2017 22:34   Ct Angio Chest Pe W And/or Wo Contrast  Result Date: 08/19/2017 CLINICAL DATA:  Orthopnea, cough, shortness of breath and lower extremity swelling. PE suspected, intermediate prob, positive D-dimer EXAM: CT ANGIOGRAPHY CHEST WITH CONTRAST TECHNIQUE: Multidetector CT  imaging of the chest was performed using the standard protocol during bolus administration of intravenous contrast. Multiplanar CT image reconstructions and MIPs were obtained to evaluate the vascular anatomy. CONTRAST:  55mL ISOVUE-370 IOPAMIDOL (ISOVUE-370) INJECTION 76% COMPARISON:  Radiograph yesterday. FINDINGS: Cardiovascular: Mild breathing motion artifact. There are no filling defects within the pulmonary arteries to suggest pulmonary embolus. Thoracic aorta is normal in caliber without dissection. The heart is normal in size. No pericardial effusion. Mediastinum/Nodes: Shotty mediastinal lymph nodes not enlarged by size criteria. Small right hilar node measuring 11 mm short axis. Small hiatal hernia with minimal distal esophageal wall thickening. No demonstrated thyroid nodule. Lungs/Pleura: Breathing motion artifact. No confluent consolidation. 4 mm right middle lobe pulmonary nodule image 48 series 6. Minimal lingular atelectasis. No septal thickening or pulmonary edema. No pleural fluid. Upper Abdomen: No  acute abnormality. Musculoskeletal: There are no acute or suspicious osseous abnormalities. Review of the MIP images confirms the above findings. IMPRESSION: 1. No pulmonary embolus or acute intrathoracic abnormality. 2. Small hiatal hernia and distal esophageal wall thickening, can be seen with reflux. 3. Right middle lobe 4 mm pulmonary nodule. No follow-up needed if patient is low-risk. Non-contrast chest CT can be considered in 12 months if patient is high-risk. This recommendation follows the consensus statement: Guidelines for Management of Incidental Pulmonary Nodules Detected on CT Images: From the Fleischner Society 2017; Radiology 2017; 284:228-243. Electronically Signed   By: Jeb Levering M.D.   On: 08/19/2017 01:38    EKG: Independently reviewed.  Normal sinus rhythm.  Assessment/Plan Principal Problem:   Chest pain Active Problems:   Uncontrolled type 2 diabetes mellitus with hyperglycemia (HCC)   Essential hypertension    1. Chest pain with shortness of breath -chest pain it appears atypical but has exertional shortness of breath.  At this time we will cycle cardiac markers check 2D echo have requested cardiology consult.  Patient is on aspirin. 2. Diabetes mellitus type 2 uncontrolled -patient states he follows up with endocrinologist and her last hemoglobin was around 13 last month.  Patient is on large doses of NovoLog 70/3075 units 3 times daily along with sliding scale coverage.  Patient also takes [place].  3. Hypertension on losartan and hydrochlorothiazide.   DVT prophylaxis: Lovenox. Code Status: Full code. Family Communication: Patient's husband. Disposition Plan: Home. Consults called: Cardiology. Admission status: Observation.   Rise Patience MD [facility] Pager [phone].  If 7PM-7AM, please contact night-coverage [email or web address] Password [email or web address]  08/19/2017, 6:07 AM

## 2017-08-19 NOTE — Plan of Care (Signed)
Pt compliant with use of call bell to ask for assistance from staff.

## 2017-08-19 NOTE — ED Notes (Signed)
Patient transported to CT 

## 2017-08-19 NOTE — ED Provider Notes (Signed)
CHIEF COMPLAINT: Chest pain and shortness of breath  HPI: Patient is a 62 year old female with history of obesity, hypertension, diabetes, hyperlipidemia who presents to the emergency department with progressively worsening shortness of breath with exertion.  Also worse with lying flat.  Has had nonproductive cough.  Also has intermittent left-sided chest pain that she describes as feeling like "trapped gas".  She states she was diagnosed with pneumonia on 08/03/17 at urgent care and was put on antibiotics and Lasix.  She has been coughing up blood but states this resolved.  Reports she has had some swelling of her left foot without any injury.  No history of PE or DVT.  She did have an elevated d-dimer at the urgent care.  She has not yet had a CT scan.  No history of stress test or cardiac catheterization.  No chest pain currently.  ROS: See HPI Constitutional: no fever  Eyes: no drainage  ENT: no runny nose   Cardiovascular:   chest pain  Resp:  SOB  GI: no vomiting GU: no dysuria Integumentary: no rash  Allergy: no hives  Musculoskeletal: Left leg swelling  Neurological: no slurred speech ROS otherwise negative  PAST MEDICAL HISTORY/PAST SURGICAL HISTORY:  Past Medical History:  Diagnosis Date  . Diabetes mellitus   . Hypercholesterolemia   . Hypertension     MEDICATIONS:  Prior to Admission medications   Medication Sig Start Date End Date Taking? Authorizing Provider  acetaminophen (TYLENOL) 500 MG tablet Take 1,000 mg by mouth 2 (two) times daily as needed for moderate pain.    [provider]  amitriptyline (ELAVIL) 25 MG tablet Take 25 mg by mouth at bedtime.    [provider]  diazepam (VALIUM) 2 MG tablet Take 1 tablet (2 mg total) by mouth every 8 (eight) hours as needed for anxiety. 08/22/15   Mesner, Corene Cornea, MD  HYDROcodone-acetaminophen (NORCO/VICODIN) 5-325 MG tablet Take 1 tablet by mouth every 6 (six) hours as needed for moderate pain. 08/22/15   Mesner,  Corene Cornea, MD  ibuprofen (ADVIL,MOTRIN) 200 MG tablet Take 200 mg by mouth every 6 (six) hours as needed for moderate pain. For pain    [provider]  insulin aspart protamine- aspart (NOVOLOG MIX 70/30) (70-30) 100 UNIT/ML injection Inject 75 Units into the skin 3 (three) times daily.    [provider]  losartan-hydrochlorothiazide (HYZAAR) 100-25 MG tablet Take 1 tablet by mouth daily.    [provider]  metFORMIN (GLUCOPHAGE-XR) 750 MG 24 hr tablet Take 750 mg by mouth 2 (two) times daily.    [provider]  oxyCODONE-acetaminophen (PERCOCET) 5-325 MG tablet Take 1 tablet by mouth every 4 (four) hours as needed for severe pain. 08/28/15   Kirichenko, Tatyana, PA-C  pregabalin (LYRICA) 75 MG capsule Take 225 mg by mouth at bedtime.    [provider]    ALLERGIES:  No Known Allergies  SOCIAL HISTORY:  Social History   Tobacco Use  . Smoking status: Never Smoker  . Smokeless tobacco: Never Used  Substance Use Topics  . Alcohol use: No    FAMILY HISTORY: No family history on file.  EXAM: BP 126/77   Pulse (!) 101   Temp 98.6 F (37 C) (Oral)   Resp 16   Ht 5' (1.524 m)   Wt 89.8 kg (198 lb)   SpO2 97%   BMI 38.67 kg/m  CONSTITUTIONAL: Alert and oriented and responds appropriately to questions. Well-appearing; well-nourished HEAD: Normocephalic EYES: Conjunctivae clear, pupils  appear equal, EOMI ENT: normal nose; moist mucous membranes NECK: Supple, no meningismus, no nuchal rigidity, no LAD  CARD: RRR; S1 and S2 appreciated; no murmurs, no clicks, no rubs, no gallops RESP: Normal chest excursion without splinting or tachypnea; breath sounds clear and equal bilaterally; no wheezes, no rhonchi, no rales, no hypoxia or respiratory distress, speaking full sentences ABD/GI: Normal bowel sounds; non-distended; soft, non-tender, no rebound, no guarding, no peritoneal signs, no hepatosplenomegaly BACK:  The back appears normal and is  non-tender to palpation, there is no CVA tenderness EXT: Normal ROM in all joints; non-tender to palpation; no edema; normal capillary refill; no cyanosis, no calf tenderness or swelling; patient has a small amount of soft tissue swelling noted around the left ankle without erythema or warmth SKIN: Normal color for age and race; warm; no rash NEURO: Moves all extremities equally PSYCH: The patient's mood and manner are appropriate. Grooming and personal hygiene are appropriate.  MEDICAL DECISION MAKING: Patient here with complaints of intermittent chest pain and shortness of breath worse with exertion and lying flat.  Had a positive d-dimer as an outpatient in urgent care and a positive d-dimer here.  First troponin negative.  Chest x-ray clear.  No current infectious symptoms.  EKG shows no ischemic changes.  Will proceed with CT of her chest but I am also concerned for possible ACS.  She has never had any provocative testing.  ED PROGRESS: CT scan shows no PE, edema, pneumonia, signs of tuberculosis.  Will admit for ACS rule out.  Patient comfortable with this plan.  Currently chest pain-free.  Will give full dose aspirin.  She was mildly hyperglycemic here without DKA.  Given IV fluids and subcutaneous insulin.    2:52 AM Discussed patient's case with hospitalist, Dr. Hal Hope.  I have recommended admission and patient (and family if present) agree with this plan. Admitting physician will place admission orders.   I reviewed all nursing notes, vitals, pertinent previous records, EKGs, lab and urine results, imaging (as available).     EKG Interpretation  Date/Time:  Wednesday August 18 2017 21:34:51 EST Ventricular Rate:  106 PR Interval:  162 QRS Duration: 92 QT Interval:  334 QTC Calculation: 443 R Axis:   52 Text Interpretation:  Sinus tachycardia Cannot rule out Anterior infarct , age undetermined Abnormal ECG No significant change since last tracing other than rate is faster  Confirmed by Justa Hatchell, Cyril Mourning [phone]) on 08/19/2017 2:38:07 AM          Kupono Marling, Delice Bison, DO 08/19/17 [phone]

## 2017-08-19 NOTE — Progress Notes (Signed)
The patient was admitted early this AM after midnight and H and P has been reviewed and I am in agreement with the current Assessment and Plan. Additional changes to the plan of care have been made accordingly. The patient is a 62 yo obese AAF with a PMH of HTN, HLD, Diabetes Mellitus Type 2 complicated by Neuropathy and other commodities who presented with SOB, especially with laying flat and walking up a flight of stairs along with some Chest Pain. She was found to have an elevated D-Dimer and underwent CTA which was negative for PE but did show a RML 4 mm pulmonary nodule that will need outpatient monitoring. Cardiology was consulted for further evaluation and recommendations and they recommended ECHOCardiogram today. Cardiology recommending Cardiac Cath in AM given no good non-invasive testing due to body habitus but patient undecided on Catheterization. First Cardiac Troponin has been negative. Patient has also stating she has had unilateral leg swelling Left worse than Right but will order Bilateral LE Duplex to evaluate DVT's. Will follow up on imaging studies and patient's clinical response to intervention and repeat blood work in AM.

## 2017-08-19 NOTE — ED Notes (Signed)
Report attempted 

## 2017-08-19 NOTE — Progress Notes (Signed)
Pt has expressed to this nurse that she does not want to have cardiac cath procedure done that is scheduled for 1st case 1/4 at 0730 by Dr. Irish Lack. Pt has been educated on the importance and benefits but at this time still declines. Per cards note, it has been expressed that pt needed time to think about having procedure done and that cath has been scheduled tentatively. Will not proceed with cath prep at this time.  Jacqlyn Larsen, RN

## 2017-08-19 NOTE — Plan of Care (Signed)
Pt CP free at this time. Awaiting admitting order. Pt overall stable. Will continue to monitor.
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
## 2017-08-20 ENCOUNTER — Observation Stay (HOSPITAL_BASED_OUTPATIENT_CLINIC_OR_DEPARTMENT_OTHER): Payer: BC Managed Care – PPO

## 2017-08-20 ENCOUNTER — Encounter (HOSPITAL_COMMUNITY)
Admission: EM | Disposition: A | Discharge status: Home / Self Care | Payer: Self-pay | Source: Home / Self Care | Attending: Emergency Medicine

## 2017-08-20 DIAGNOSIS — I5030 Unspecified diastolic (congestive) heart failure: Secondary | ICD-10-CM

## 2017-08-20 DIAGNOSIS — R079 Chest pain, unspecified: Secondary | ICD-10-CM | POA: Diagnosis not present

## 2017-08-20 DIAGNOSIS — M7989 Other specified soft tissue disorders: Secondary | ICD-10-CM | POA: Diagnosis not present

## 2017-08-20 DIAGNOSIS — E1165 Type 2 diabetes mellitus with hyperglycemia: Secondary | ICD-10-CM | POA: Diagnosis not present

## 2017-08-20 DIAGNOSIS — I10 Essential (primary) hypertension: Secondary | ICD-10-CM | POA: Diagnosis not present

## 2017-08-20 DIAGNOSIS — E785 Hyperlipidemia, unspecified: Secondary | ICD-10-CM | POA: Diagnosis not present

## 2017-08-20 DIAGNOSIS — E1169 Type 2 diabetes mellitus with other specified complication: Secondary | ICD-10-CM

## 2017-08-20 DIAGNOSIS — R0609 Other forms of dyspnea: Secondary | ICD-10-CM | POA: Diagnosis not present

## 2017-08-20 LAB — LIPID PANEL
CHOL/HDL RATIO: 9 ratio
CHOLESTEROL: 251 mg/dL — AB (ref 0–200)
HDL: 28 mg/dL — ABNORMAL LOW (ref >40)
LDL CALC: UNDETERMINED mg/dL (ref 0–99)
TRIGLYCERIDES: 1001 mg/dL — AB (ref <150)
VLDL: UNDETERMINED mg/dL (ref 0–40)

## 2017-08-20 LAB — MAGNESIUM: Magnesium: 2.2 mg/dL (ref 1.7–2.4)

## 2017-08-20 LAB — CBC WITH DIFFERENTIAL/PLATELET
Basophils Absolute: 0 10*3/uL (ref 0.0–0.1)
Basophils Relative: 1 %
EOS ABS: 0.1 10*3/uL (ref 0.0–0.7)
Eosinophils Relative: 2 %
HCT: 40.2 % (ref 36.0–46.0)
HEMOGLOBIN: 13.3 g/dL (ref 12.0–15.0)
LYMPHS PCT: 64 %
Lymphs Abs: 3.3 10*3/uL (ref 0.7–4.0)
MCH: 28.9 pg (ref 26.0–34.0)
MCHC: 33.1 g/dL (ref 30.0–36.0)
MCV: 87.4 fL (ref 78.0–100.0)
Monocytes Absolute: 0.3 10*3/uL (ref 0.1–1.0)
Monocytes Relative: 6 %
NEUTROS PCT: 27 %
Neutro Abs: 1.4 10*3/uL — ABNORMAL LOW (ref 1.7–7.7)
Platelets: 179 10*3/uL (ref 150–400)
RBC: 4.6 MIL/uL (ref 3.87–5.11)
RDW: 13 % (ref 11.5–15.5)
WBC: 5.2 10*3/uL (ref 4.0–10.5)

## 2017-08-20 LAB — PROTIME-INR
INR: 0.96
PROTHROMBIN TIME: 12.7 s (ref 11.4–15.2)

## 2017-08-20 LAB — COMPREHENSIVE METABOLIC PANEL
ALK PHOS: 71 U/L (ref 38–126)
ALT: 35 U/L (ref 14–54)
AST: 40 U/L (ref 15–41)
Albumin: 3.5 g/dL (ref 3.5–5.0)
Anion gap: 9 (ref 5–15)
BUN: 22 mg/dL — AB (ref 6–20)
CALCIUM: 9.2 mg/dL (ref 8.9–10.3)
CO2: 27 mmol/L (ref 22–32)
CREATININE: 0.93 mg/dL (ref 0.44–1.00)
Chloride: 99 mmol/L — ABNORMAL LOW (ref 101–111)
GFR calc non Af Amer: >60 mL/min (ref >60)
Glucose, Bld: 241 mg/dL — ABNORMAL HIGH (ref 65–99)
Potassium: 3.6 mmol/L (ref 3.5–5.1)
SODIUM: 135 mmol/L (ref 135–145)
Total Bilirubin: 0.9 mg/dL (ref 0.3–1.2)
Total Protein: 7.4 g/dL (ref 6.5–8.1)

## 2017-08-20 LAB — PHOSPHORUS: PHOSPHORUS: 3.9 mg/dL (ref 2.5–4.6)

## 2017-08-20 LAB — GLUCOSE, CAPILLARY
GLUCOSE-CAPILLARY: 318 mg/dL — AB (ref 65–99)
Glucose-Capillary: 255 mg/dL — ABNORMAL HIGH (ref 65–99)

## 2017-08-20 LAB — ECHOCARDIOGRAM COMPLETE
Height: 60 in
Weight: 3262.81 oz

## 2017-08-20 SURGERY — RIGHT/LEFT HEART CATH AND CORONARY ANGIOGRAPHY
Anesthesia: LOCAL

## 2017-08-20 MED ORDER — ATORVASTATIN CALCIUM 40 MG PO TABS: 40.0000 mg | ORAL_TABLET | Freq: Every day | ORAL | 0 refills | Status: DC

## 2017-08-20 MED ORDER — PERFLUTREN LIPID MICROSPHERE
1.0000 mL | INTRAVENOUS | Status: AC | PRN
  Administered 2017-08-20: 2 mL via INTRAVENOUS

## 2017-08-20 MED ORDER — SODIUM CHLORIDE 0.9% FLUSH: 3.0000 mL | INTRAVENOUS | Status: DC | PRN

## 2017-08-20 MED ORDER — ATORVASTATIN CALCIUM 40 MG PO TABS
40.0000 mg | ORAL_TABLET | Freq: Every day | ORAL | Status: DC
Start: 2017-08-20 — End: 2017-08-20

## 2017-08-20 MED ORDER — ASPIRIN 81 MG PO CHEW: 81.0000 mg | CHEWABLE_TABLET | ORAL | Status: DC

## 2017-08-20 MED ORDER — LIVING WELL WITH DIABETES BOOK
Freq: Once | Status: DC
  Filled 2017-08-20: qty 1

## 2017-08-20 MED ORDER — SODIUM CHLORIDE 0.9 % WEIGHT BASED INFUSION: 3.0000 mL/kg/h | INTRAVENOUS | Status: AC

## 2017-08-20 MED ORDER — SODIUM CHLORIDE 0.9 % WEIGHT BASED INFUSION: 1.0000 mL/kg/h | INTRAVENOUS | Status: DC

## 2017-08-20 MED ORDER — ASPIRIN 81 MG PO TBEC: 81.0000 mg | DELAYED_RELEASE_TABLET | Freq: Every day | ORAL | 0 refills | Status: DC

## 2017-08-20 MED ORDER — SODIUM CHLORIDE 0.9 % WEIGHT BASED INFUSION: 3.0000 mL/kg/h | INTRAVENOUS | Status: DC

## 2017-08-20 MED ORDER — SODIUM CHLORIDE 0.9 % IV SOLN: 250.0000 mL | INTRAVENOUS | Status: DC | PRN

## 2017-08-20 MED ORDER — SODIUM CHLORIDE 0.9% FLUSH
3.0000 mL | Freq: Two times a day (BID) | INTRAVENOUS | Status: DC
  Administered 2017-08-20: 3 mL via INTRAVENOUS

## 2017-08-20 NOTE — Progress Notes (Deleted)
Cardiology Office Note   Date:  08/20/2017   ID:  HYACINTH Carpenter, DOB 1956/04/06, MRN [id number]  PCP:  Patient, No Pcp Per  Cardiologist:   Jenkins Rouge, MD   No chief complaint on file.     History of Present Illness: Christy Carpenter is a 62 y.o. female who presents for post hospital f/u. Obese black female seen in hospital 08/19/16 With chest pain and dyspnea. CRF;s include HTN, HLD and poorly controlled DM/ R/O ECG with non specific Inferior T wave changes and poor R wave progression Reviewed echo from 08/20/16 normal EF 60-65% only Grade one diastolic dysfunction with no valve disease and normal estimated PA pressures by TR velocity.  Patient's body habitus makes myovue and cardiac CT poor non invasive risk stratifying strategy due to high likelyhood of false positive or indeterminate results. ( obese and large breasted). Recommended diagnostic Cath from radial approach as definitive test especially given poorly controlled DM After explaining risks and procedure Patient declined and wanted to "think" about it. Seen now in f/u  ****    Past Medical History:  Diagnosis Date  . Diabetes mellitus   . Hypercholesterolemia   . Hypertension     Past Surgical History:  Procedure Laterality Date  . HAND LIGAMENT RECONSTRUCTION       No current facility-administered medications for this visit.    Current Outpatient Medications  Medication Sig Dispense Refill  . [START ON 08/21/2017] aspirin EC 81 MG EC tablet Take 1 tablet (81 mg total) by mouth daily. 30 tablet 0  . atorvastatin (LIPITOR) 40 MG tablet Take 1 tablet (40 mg total) by mouth daily at 6 PM. 30 tablet 0   Facility-Administered Medications Ordered in Other Visits  Medication Dose Route Frequency Provider Last Rate Last Dose  . 0.9 %  sodium chloride infusion  250 mL Intravenous PRN Daune Perch, NP      . 0.9% sodium chloride infusion  1 mL/kg/hr Intravenous Continuous Jettie Booze, MD      . acetaminophen  (TYLENOL) tablet 650 mg  650 mg Oral Q4H PRN Rise Patience, MD      . amitriptyline (ELAVIL) tablet 25 mg  25 mg Oral QHS Rise Patience, MD   25 mg at 08/19/17 2135  . aspirin chewable tablet 81 mg  81 mg Oral Pre-Cath Jettie Booze, MD      . aspirin EC tablet 81 mg  81 mg Oral Daily Daune Perch, NP   81 mg at 08/20/17 7619  . atorvastatin (LIPITOR) tablet 40 mg  40 mg Oral q1800 Sheikh, Omair Latif, DO      . enoxaparin (LOVENOX) injection 40 mg  40 mg Subcutaneous Q24H Rise Patience, MD   40 mg at 08/20/17 5093  . insulin aspart (novoLOG) injection 0-9 Units  0-9 Units Subcutaneous TID WC Rise Patience, MD   7 Units at 08/20/17 1314  . insulin NPH Human (HUMULIN N,NOVOLIN N) injection 75 Units  75 Units Subcutaneous BID AC & HS Raiford Noble [place], DO   75 Units at 08/20/17 2671   And  . insulin NPH Human (HUMULIN N,NOVOLIN N) injection 40 Units  40 Units Subcutaneous Q lunch Raiford Noble [place], [place]   40 Units at 08/20/17 1317  . living well with diabetes book MISC   Does not apply Once [facility], Omair Latif, DO      . losartan (COZAAR) tablet 100 mg  100 mg Oral Daily Gean Birchwood  N, MD   100 mg at 08/20/17 0813  . ondansetron (ZOFRAN) injection 4 mg  4 mg Intravenous Q6H PRN Rise Patience, MD      . pregabalin (LYRICA) capsule 225 mg  225 mg Oral QHS Rise Patience, MD   225 mg at 08/19/17 2135  . sodium chloride flush (NS) 0.9 % injection 3 mL  3 mL Intravenous Q12H Daune Perch, NP   3 mL at 08/20/17 0812  . sodium chloride flush (NS) 0.9 % injection 3 mL  3 mL Intravenous PRN Daune Perch, NP        Allergies:   Patient has no known allergies.    Social History:  The patient  reports that  has never smoked. she has never used smokeless tobacco. She reports that she does not drink alcohol or use drugs.   Family History:  The patient's family history includes Diabetes Mellitus II in her brother and mother; Hypertension in her  brother and mother; Stroke in her brother and mother.    ROS:  Please see the history of present illness.   Otherwise, review of systems are positive for none.   All other systems are reviewed and negative.    PHYSICAL EXAM: VS:  There were no vitals taken for this visit. , BMI There is no height or weight on file to calculate BMI. Affect appropriate Obese black female  HEENT: normal Neck supple with no adenopathy JVP normal no bruits no thyromegaly Lungs clear with no wheezing and good diaphragmatic motion Heart:  S1/S2 no murmur, no rub, gallop or click PMI normal Abdomen: benighn, BS positve, no tenderness, no AAA no bruit.  No HSM or HJR Distal pulses intact with no bruits No edema Neuro non-focal Skin warm and dry No muscular weakness    EKG: SR inferior non specific ST changes poor R wave progression    Recent Labs: 08/18/2017: B Natriuretic Peptide 5.9 08/20/2017: ALT 35; BUN 22; Creatinine, Ser 0.93; Hemoglobin 13.3; Magnesium 2.2; Platelets 179; Potassium 3.6; Sodium 135    Lipid Panel    Component Value Date/Time   CHOL 251 (H) 08/20/2017 0342   TRIG 1,001 (H) 08/20/2017 0342   HDL 28 (L) 08/20/2017 0342   CHOLHDL 9.0 08/20/2017 0342   VLDL UNABLE TO CALCULATE IF TRIGLYCERIDE OVER 400 mg/dL 08/20/2017 0342   LDLCALC UNABLE TO CALCULATE IF TRIGLYCERIDE OVER 400 mg/dL 08/20/2017 0342      Wt Readings from Last 3 Encounters:  08/20/17 203 lb 14.8 oz (92.5 kg)      Other studies Reviewed: Additional studies/ records that were reviewed today include: hospital consult, labs , CXR ECG and echo .    ASSESSMENT AND PLAN:  1.  Chest Pain: 2. DM Discussed low carb diet.  Target hemoglobin A1c is 6.5 or less.  Continue current medications. 3. HTN continue ARB given DM 4. HLD: statin started in hospital f/u lipids/LFTls with primary in 3 months    Current medicines are reviewed at length with the patient today.  The patient does not have concerns regarding  medicines.  The following changes have been made:  no change  Labs/ tests ordered today include: *** No orders of the defined types were placed in this encounter.    Disposition:   FU with ***     Signed, Jenkins Rouge, MD  08/20/2017 1:40 PM    [facility] [address], [place], [place]  [address] Phone: [phone]; Fax: [phone]

## 2017-08-20 NOTE — Progress Notes (Signed)
Progress Note  Patient Name: Christy Carpenter Date of Encounter: 08/20/2017  Primary Cardiologist: No primary care provider on file.   Subjective   No chest pain and no SOB today.  Has only walked in her room.  Is very afraid on the cardiac cath and has declined currently.  She would like to go home and discuss with her endocrinologist and return as outpt for cath.  Inpatient Medications    Scheduled Meds: . amitriptyline  25 mg Oral QHS  . aspirin  81 mg Oral Pre-Cath  . aspirin EC  81 mg Oral Daily  . enoxaparin (LOVENOX) injection  40 mg Subcutaneous Q24H  . insulin aspart  0-9 Units Subcutaneous TID WC  . insulin NPH Human  75 Units Subcutaneous BID AC & HS   And  . insulin NPH Human  40 Units Subcutaneous Q lunch  . losartan  100 mg Oral Daily  . pregabalin  225 mg Oral QHS  . sodium chloride flush  3 mL Intravenous Q12H   Continuous Infusions: . sodium chloride    . sodium chloride     PRN Meds: sodium chloride, acetaminophen, ondansetron (ZOFRAN) IV, sodium chloride flush   Vital Signs    Vitals:   08/19/17 0408 08/19/17 1434 08/19/17 2054 08/20/17 0500  BP: (!) 144/62 95/80 114/82 (!) 149/69  Pulse: 93 83 96 89  Resp:   18 18  Temp: 98.4 F (36.9 C) 97.7 F (36.5 C) 99 F (37.2 C) 98.1 F (36.7 C)  TempSrc: Oral Oral Oral Oral  SpO2: 96% 98% 97% 95%  Weight:    203 lb 14.8 oz (92.5 kg)  Height:        Intake/Output Summary (Last 24 hours) at 08/20/2017 0756 Last data filed at 08/19/2017 2130 Gross per 24 hour  Intake 840 ml  Output -  Net 840 ml   Filed Weights   08/18/17 2123 08/19/17 0400 08/20/17 0500  Weight: 198 lb (89.8 kg) 203 lb (92.1 kg) 203 lb 14.8 oz (92.5 kg)    Telemetry    SR - Personally Reviewed  ECG    No new - Personally Reviewed  Physical Exam   GEN: No acute distress.   Neck: No JVD Cardiac: RRR, no murmurs, rubs, or gallops.  Respiratory: Clear to auscultation bilaterally. GI: Soft, nontender, non-distended  MS: No  edema; No deformity. Neuro:  Nonfocal  Psych: Normal affect   Labs    Chemistry Recent Labs  Lab 08/18/17 2135 08/19/17 0712 08/19/17 1152 08/20/17 0342  NA 131*  --  134* 135  K 3.7  --  3.5 3.6  CL 92*  --  98* 99*  CO2 27  --  25 27  GLUCOSE 371*  --  225* 241*  BUN 21*  --  19 22*  CREATININE 1.02* 0.83 0.86 0.93  CALCIUM 9.3  --  9.4 9.2  PROT  --   --  7.7 7.4  ALBUMIN  --   --  3.7 3.5  AST  --   --  46* 40  ALT  --   --  38 35  ALKPHOS  --   --  79 71  BILITOT  --   --  0.9 0.9  GFRNONAA 59* >60 >60 >60  GFRAA >60 >60 >60 >60  ANIONGAP 12  --  11 9     Hematology Recent Labs  Lab 08/19/17 0712 08/19/17 1152 08/20/17 0342  WBC 5.6 5.6 5.2  RBC 4.70 4.64  4.60  HGB 14.0 13.8 13.3  HCT 41.2 40.7 40.2  MCV 87.7 87.7 87.4  MCH 29.8 29.7 28.9  MCHC 34.0 33.9 33.1  RDW 13.3 13.4 13.0  PLT 179 188 179    Cardiac Enzymes Recent Labs  Lab 08/19/17 0712 08/19/17 1152 08/19/17 1821  TROPONINI <0.03 <0.03 <0.03    Recent Labs  Lab 08/18/17 2148  TROPIPOC 0.01     BNP Recent Labs  Lab 08/18/17 2135  BNP 5.9     DDimer  Recent Labs  Lab 08/18/17 2135  DDIMER 0.61*     Radiology    Dg Chest 2 View  Result Date: 08/18/2017 CLINICAL DATA:  Left shoulder pain with pain radiating down the left arm. Diagnosed with pneumonia in December 2018. Still on antibiotics. Swelling in the left foot. History of hypertension and diabetes. EXAM: CHEST  2 VIEW COMPARISON:  None. FINDINGS: The heart size and mediastinal contours are within normal limits. Both lungs are clear. The visualized skeletal structures are unremarkable. IMPRESSION: No active cardiopulmonary disease. Electronically Signed   By: Lucienne Capers M.D.   On: 08/18/2017 22:34   Ct Angio Chest Pe W And/or Wo Contrast  Result Date: 08/19/2017 CLINICAL DATA:  Orthopnea, cough, shortness of breath and lower extremity swelling. PE suspected, intermediate prob, positive D-dimer EXAM: CT ANGIOGRAPHY  CHEST WITH CONTRAST TECHNIQUE: Multidetector CT imaging of the chest was performed using the standard protocol during bolus administration of intravenous contrast. Multiplanar CT image reconstructions and MIPs were obtained to evaluate the vascular anatomy. CONTRAST:  68mL ISOVUE-370 IOPAMIDOL (ISOVUE-370) INJECTION 76% COMPARISON:  Radiograph yesterday. FINDINGS: Cardiovascular: Mild breathing motion artifact. There are no filling defects within the pulmonary arteries to suggest pulmonary embolus. Thoracic aorta is normal in caliber without dissection. The heart is normal in size. No pericardial effusion. Mediastinum/Nodes: Shotty mediastinal lymph nodes not enlarged by size criteria. Small right hilar node measuring 11 mm short axis. Small hiatal hernia with minimal distal esophageal wall thickening. No demonstrated thyroid nodule. Lungs/Pleura: Breathing motion artifact. No confluent consolidation. 4 mm right middle lobe pulmonary nodule image 48 series 6. Minimal lingular atelectasis. No septal thickening or pulmonary edema. No pleural fluid. Upper Abdomen: No acute abnormality. Musculoskeletal: There are no acute or suspicious osseous abnormalities. Review of the MIP images confirms the above findings. IMPRESSION: 1. No pulmonary embolus or acute intrathoracic abnormality. 2. Small hiatal hernia and distal esophageal wall thickening, can be seen with reflux. 3. Right middle lobe 4 mm pulmonary nodule. No follow-up needed if patient is low-risk. Non-contrast chest CT can be considered in 12 months if patient is high-risk. This recommendation follows the consensus statement: Guidelines for Management of Incidental Pulmonary Nodules Detected on CT Images: From the Fleischner Society 2017; Radiology 2017; 284:228-243. Electronically Signed   By: Jeb Levering M.D.   On: 08/19/2017 01:38    Cardiac Studies   Echo pending, heart cath pending  Patient Profile     62 y.o. female with a hx of obesity,  hypertension, DIM type 2, HLD now admitted with chest pain and SOB.    Assessment & Plan    Chest pain with neg troponin.  But multiple risk factors for CAD.  Plan had been for cardiac cath but pt very afraid/anxious about this she would pre to discuss with Endo and return as outpt.  --CT chest neg for PE, + pulmonary nodule --echo pending -  Cath per Dr. Johnsie Cancel   HTN stable on losartan hctz  DM-2 on insulin  per IM  HLD with LDL 132 in 2012-needs to be rechecked  For questions or updates, please contact [place] Please consult [email or web address] for contact info under Cardiology/STEMI.      Signed, Cecilie Kicks, NP  08/20/2017, 7:56 AM    Patient examined chart reviewed Obese heavy chested black female clear lungs no murmur no swelling in left Foot. I called echo lab to make sure echo done this am. If normal d/c home can f/u with [facility] and consider Outpatient cath or observe for further symptoms  Jenkins Rouge

## 2017-08-20 NOTE — Progress Notes (Signed)
*  PRELIMINARY RESULTS* Vascular Ultrasound Bilateral lower extremity venous duplex has been completed.  Preliminary findings: No evidence of deep vein thrombosis or baker's cysts bilaterally.   Everrett Coombe 08/20/2017, 10:11 AM

## 2017-08-20 NOTE — Progress Notes (Signed)
Results for CHERICA, HEIDEN (MRN [id number]) as of 08/20/2017 12:46  Ref. Range 08/19/2017 11:34 08/19/2017 16:36 08/19/2017 21:34 08/20/2017 08:00 08/20/2017 12:23  Glucose-Capillary Latest Ref Range: 65 - 99 mg/dL 234 (H) 230 (H) 255 (H) 255 (H) 318 (H)  Noted that blood sugars have been elevated.  Recommend adding Novolog 10 units TID with meals if patient eats at least 50% of meals and if postprandial blood sugars continue to be elevated.  Harvel Ricks RN BSN CDE Diabetes Coordinator Pager: [phone]  8am-5pm

## 2017-08-20 NOTE — Discharge Summary (Signed)
Physician Discharge Summary  JACYNDA BRUNKE GDJ:[id number] DOB: 04-14-1956 DOA: 08/19/2017  PCP: Patient, No Pcp Per  Admit date: 08/19/2017 Discharge date: 08/20/2017  Admitted From: Home Disposition:  Home  Recommendations for Outpatient Follow-up:  1. Follow up with PCP in 1-2 weeks 2. Follow up with Endocrinology for further Diabetes and Blood Sugar management  3. Follow up with Cardiology as an outpatient for Cardiac Catheterization 4. Have Non-Contrast CT Chest to follow Right Middle Lobe Lung Nodule 5. Please obtain CMP/CBC, Mag, Phos in one week 6. Please follow up on the following pending results:  Home Health: No Equipment/Devices: None  Discharge Condition: Stable CODE STATUS: FULL CODE Diet recommendation: Heart Healthy Low Fat Carb Modified Diet  Brief/Interim Summary: The patient is a 62 yo obese AAF with a PMH of HTN, HLD, Diabetes Mellitus Type 2 complicated by Neuropathy and other commodities who presented with SOB, especially with laying flat and walking up a flight of stairs along with some Chest Pain. She was found to have an elevated D-Dimer and underwent CTA which was negative for PE but did show a RML 4 mm pulmonary nodule that will need outpatient monitoring. Cardiology was consulted for further evaluation and recommendations and they recommended ECHOCardiogram today. Cardiology recommending Cardiac Cath in AM given no good non-invasive testing due to body habitus but patient undecided on Catheterization. Cardiac Troponin x3 have been negative. Patient has also stating she has had unilateral leg swelling Left worse than Right but LE Duplex Negative for DVT. Patient declined Cardiac Catheterization and wanted to discuss it with Endocrinology as an outpatient. Cardiology recommended if ECHOCardiogram was normal, patient could be D/C'd and follow up with [facility] for discussion of Cardiac Catheterization. Patient's ECHO showed EF of 60-65% with Grade 1DD, and after I spoke  to Dr. Johnsie Cancel, he stated she was ok to D/C Home with outpatient follow up with PCP, Endocrinology, and Cardiology   Discharge Diagnoses:  Principal Problem:   Chest pain Active Problems:   Uncontrolled type 2 diabetes mellitus with hyperglycemia ([place])   Essential hypertension  Chest Pain with Shortness of Breath -Chest pain it appears atypical but has exertional shortness of breath. -Has Multiple risk Factors for CAD including obesity, HTN, HLD, and Diabetes  -D-Dimer was 0.61 so CTA of Chest was obtained and showed No pulmonary embolus or acute intrathoracic abnormality.Small hiatal hernia and distal esophageal wall thickening,can be seen with reflux. There was also a right middle lobe 4 mm pulmonary nodule.  -Cycled Cardiac Enzymes and were Negative x 3 at <0.03 -Cardiology Consulted for further evaluation and recommendations -Recommended Cath but patient Refused and wants to discuss with Endocrinology and talk with Cardiology as an outpatient  -Per Cardiology if ECHO was normal can D/C Home and follow up with [facility] as an outpatient for consideration of Outpatient Cardiac Catheterization  -ECHO showed EF of 60-65% and Grade 1 DD -C/w ASA 81 mg and Added Atorvastatin 40 mg   Diabetes mellitus type 2, uncontrolled -patient states he follows up with endocrinologist and her last hemoglobin A1c was around 13 last month.   -Patient is on large doses of NovoLog 70/3075 units 3 times daily along with sliding scale coverage.  Patient also takes [place] and Metformin and will continue -Follow up with Endocrinology at D/C -CBG's ranging from 230-318  Hypertension  -C/w Home Losartan-Hydrochlorothiazide combination.  HLD  -Had LDL 132 in 2012 -Rpeat Lipid Panel showed Cholesterol of 251, HDl of 28, TG of 1,001; Unable  to Calculate LDL and VLDL -Started   LE Swelling, Left worse than Right -Checked LE Doppler and Preliminary findings showed: No evidence of deep vein thrombosis or  baker's cysts bilaterally -ECHO Grade 1 DD but EF of 60-65% -Advised to mobilized and keep legs elevated -C/w Lasix 20 mg po Daily  -Follow up with PCP and Cardiology as an outpatient   Right Middle Lobe Pulmonary Nodule -Right middle lobe 4 mm pulmonary nodule seen on CTA -Consider Non-contrast chest CT can be considered in 12 months if patient is high-risk as an outpatient  -Follow up with PCP   GERD -C/w Home Omeprazole  Discharge Instructions  Discharge Instructions    Call MD for:  difficulty breathing, headache or visual disturbances   Complete by:  As directed    Call MD for:  extreme fatigue   Complete by:  As directed    Call MD for:  hives   Complete by:  As directed    Call MD for:  persistant dizziness or light-headedness   Complete by:  As directed    Call MD for:  persistant nausea and vomiting   Complete by:  As directed    Call MD for:  redness, tenderness, or signs of infection (pain, swelling, redness, odor or green/yellow discharge around incision site)   Complete by:  As directed    Call MD for:  severe uncontrolled pain   Complete by:  As directed    Call MD for:  temperature >100.4   Complete by:  As directed    Diet - low sodium heart healthy   Complete by:  As directed    Diet Carb Modified   Complete by:  As directed    Discharge instructions   Complete by:  As directed    Follow up with PCP, Cardiology as an outpatient. Take all medications as prescribed. If symptoms change or worsen please return to the ED for evaluation.   Increase activity slowly   Complete by:  As directed      Allergies as of 08/20/2017   No Known Allergies     Medication List    STOP taking these medications   amoxicillin-clavulanate 875-125 MG tablet Commonly known as:  AUGMENTIN   meloxicam 7.5 MG tablet Commonly known as:  MOBIC     TAKE these medications   amitriptyline 25 MG tablet Commonly known as:  ELAVIL Take 25 mg by mouth at bedtime.   aspirin 81  MG EC tablet Take 1 tablet (81 mg total) by mouth daily.   atorvastatin 40 MG tablet Commonly known as:  LIPITOR Take 1 tablet (40 mg total) by mouth daily at 6 PM.   FARXIGA 10 MG Tabs tablet Generic drug:  dapagliflozin propanediol Take 10 mg by mouth daily.   furosemide 20 MG tablet Commonly known as:  LASIX Take 20 mg by mouth daily.   losartan-hydrochlorothiazide 100-25 MG tablet Commonly known as:  HYZAAR Take 1 tablet by mouth daily.   metFORMIN 500 MG 24 hr tablet Commonly known as:  GLUCOPHAGE-XR Take 500 mg by mouth 2 (two) times daily.   NOVOLIN N 100 UNIT/ML injection Generic drug:  insulin NPH Human Inject 40-75 Units into the skin See admin instructions. Take 75 units in the morning and the evening and 40 units at lunch   NOVOLOG 100 UNIT/ML injection Generic drug:  insulin aspart Inject 10-40 Units into the skin See admin instructions. 70-100  10 units 100-150  15 units 150-200=  20  units 201-250 =25 units 251-300= 30  Units 301-350= 35 units 351-400 = 40 units Over 400 call physician   omeprazole 40 MG capsule Commonly known as:  PRILOSEC Take 40 mg by mouth daily.   pregabalin 150 MG capsule Commonly known as:  LYRICA Take 150 mg by mouth 3 (three) times daily.   promethazine-dextromethorphan 6.25-15 MG/5ML syrup Commonly known as:  PROMETHAZINE-DM Take 15 mg by mouth every 6 (six) hours.      Follow-up Information    Josue Hector, MD Follow up on 08/26/2017.   Specialty:  Cardiology Why:  at 11:15 am   Contact information: [address] [place] [address] [phone]          No Known Allergies  Consultations:  Cardiology Dr. Johnsie Cancel  Procedures/Studies: Dg Chest 2 View  Result Date: 08/18/2017 CLINICAL DATA:  Left shoulder pain with pain radiating down the left arm. Diagnosed with pneumonia in December 2018. Still on antibiotics. Swelling in the left foot. History of hypertension and diabetes. EXAM: CHEST  2  VIEW COMPARISON:  None. FINDINGS: The heart size and mediastinal contours are within normal limits. Both lungs are clear. The visualized skeletal structures are unremarkable. IMPRESSION: No active cardiopulmonary disease. Electronically Signed   By: Lucienne Capers M.D.   On: 08/18/2017 22:34   Ct Angio Chest Pe W And/or Wo Contrast  Result Date: 08/19/2017 CLINICAL DATA:  Orthopnea, cough, shortness of breath and lower extremity swelling. PE suspected, intermediate prob, positive D-dimer EXAM: CT ANGIOGRAPHY CHEST WITH CONTRAST TECHNIQUE: Multidetector CT imaging of the chest was performed using the standard protocol during bolus administration of intravenous contrast. Multiplanar CT image reconstructions and MIPs were obtained to evaluate the vascular anatomy. CONTRAST:  14mL ISOVUE-370 IOPAMIDOL (ISOVUE-370) INJECTION 76% COMPARISON:  Radiograph yesterday. FINDINGS: Cardiovascular: Mild breathing motion artifact. There are no filling defects within the pulmonary arteries to suggest pulmonary embolus. Thoracic aorta is normal in caliber without dissection. The heart is normal in size. No pericardial effusion. Mediastinum/Nodes: Shotty mediastinal lymph nodes not enlarged by size criteria. Small right hilar node measuring 11 mm short axis. Small hiatal hernia with minimal distal esophageal wall thickening. No demonstrated thyroid nodule. Lungs/Pleura: Breathing motion artifact. No confluent consolidation. 4 mm right middle lobe pulmonary nodule image 48 series 6. Minimal lingular atelectasis. No septal thickening or pulmonary edema. No pleural fluid. Upper Abdomen: No acute abnormality. Musculoskeletal: There are no acute or suspicious osseous abnormalities. Review of the MIP images confirms the above findings. IMPRESSION: 1. No pulmonary embolus or acute intrathoracic abnormality. 2. Small hiatal hernia and distal esophageal wall thickening, can be seen with reflux. 3. Right middle lobe 4 mm pulmonary nodule.  No follow-up needed if patient is low-risk. Non-contrast chest CT can be considered in 12 months if patient is high-risk. This recommendation follows the consensus statement: Guidelines for Management of Incidental Pulmonary Nodules Detected on CT Images: From the Fleischner Society 2017; Radiology 2017; 284:228-243. Electronically Signed   By: Jeb Levering M.D.   On: 08/19/2017 01:38   ECHOCARDIOGRAM ------------------------------------------------------------------- Study Conclusions  - Left ventricle: The cavity size was normal. Wall thickness was   normal. Systolic function was normal. The estimated ejection   fraction was in the range of 60% to 65%. Wall motion was normal;   there were no regional wall motion abnormalities. Doppler   parameters are consistent with abnormal left ventricular   relaxation (grade 1 diastolic dysfunction). - Aortic valve: Trileaflet; mildly thickened, mildly calcified  leaflets.  BILATERAL LOWER EXTREMITY VENOUS DUPLEX  Bilateral lower extremity venous duplex has been completed.  Preliminary findings: No evidence of deep vein thrombosis or baker's cysts bilaterally  Subjective: Seen and examined and was nervous about Cath. No CP and SOB improved. No nausea or vomiting. No other concerns or complaints at this time and wanting to go home.  Discharge Exam: Vitals:   08/19/17 2054 08/20/17 0500  BP: 114/82 (!) 149/69  Pulse: 96 89  Resp: 18 18  Temp: 99 F (37.2 C) 98.1 F (36.7 C)  SpO2: 97% 95%   Vitals:   08/19/17 0408 08/19/17 1434 08/19/17 2054 08/20/17 0500  BP: (!) 144/62 95/80 114/82 (!) 149/69  Pulse: 93 83 96 89  Resp:   18 18  Temp: 98.4 F (36.9 C) 97.7 F (36.5 C) 99 F (37.2 C) 98.1 F (36.7 C)  TempSrc: Oral Oral Oral Oral  SpO2: 96% 98% 97% 95%  Weight:    92.5 kg (203 lb 14.8 oz)  Height:       General: Pt is alert, awake obese AAF, not in acute distress Cardiovascular: RRR, S1/S2 +, no rubs, no  gallops Respiratory: Slightly diminished bilaterally, no wheezing, no rhonchi Abdominal: Soft, NT, Distended due to body habitus, bowel sounds + Extremities: 1+ LE edema, no cyanosis  The results of significant diagnostics from this hospitalization (including imaging, microbiology, ancillary and laboratory) are listed below for reference.    Microbiology: No results found for this or any previous visit (from the past 240 hour(s)).   Labs: BNP (last 3 results) Recent Labs    08/18/17 2135  BNP 5.9   Basic Metabolic Panel: Recent Labs  Lab 08/18/17 2135 08/19/17 0712 08/19/17 1152 08/20/17 0342  NA 131*  --  134* 135  K 3.7  --  3.5 3.6  CL 92*  --  98* 99*  CO2 27  --  25 27  GLUCOSE 371*  --  225* 241*  BUN 21*  --  19 22*  CREATININE 1.02* 0.83 0.86 0.93  CALCIUM 9.3  --  9.4 9.2  MG  --   --  2.1 2.2  PHOS  --   --  3.7 3.9   Liver Function Tests: Recent Labs  Lab 08/19/17 1152 08/20/17 0342  AST 46* 40  ALT 38 35  ALKPHOS 79 71  BILITOT 0.9 0.9  PROT 7.7 7.4  ALBUMIN 3.7 3.5   No results for input(s): LIPASE, AMYLASE in the last 168 hours. No results for input(s): AMMONIA in the last 168 hours. CBC: Recent Labs  Lab 08/18/17 2135 08/19/17 0712 08/19/17 1152 08/20/17 0342  WBC 5.2 5.6 5.6 5.2  NEUTROABS  --   --  2.0 1.4*  HGB 13.6 14.0 13.8 13.3  HCT 40.8 41.2 40.7 40.2  MCV 86.3 87.7 87.7 87.4  PLT 193 179 188 179   Cardiac Enzymes: Recent Labs  Lab 08/19/17 0712 08/19/17 1152 08/19/17 1821  TROPONINI <0.03 <0.03 <0.03   BNP: Invalid input(s): POCBNP CBG: Recent Labs  Lab 08/19/17 1134 08/19/17 1636 08/19/17 2134 08/20/17 0800 08/20/17 1223  GLUCAP 234* 230* 255* 255* 318*   D-Dimer No results for input(s): DDIMER in the last 72 hours. Hgb A1c No results for input(s): HGBA1C in the last 72 hours. Lipid Profile No results for input(s): CHOL, HDL, LDLCALC, TRIG, CHOLHDL, LDLDIRECT in the last 72 hours. Thyroid function  studies No results for input(s): TSH, T4TOTAL, T3FREE, THYROIDAB in the last 72 hours.  Invalid  input(s): FREET3 Anemia work up No results for input(s): VITAMINB12, FOLATE, FERRITIN, TIBC, IRON, RETICCTPCT in the last 72 hours. Urinalysis No results found for: COLORURINE, APPEARANCEUR, LABSPEC, [place], GLUCOSEU, HGBUR, BILIRUBINUR, KETONESUR, PROTEINUR, UROBILINOGEN, NITRITE, LEUKOCYTESUR Sepsis Labs Invalid input(s): PROCALCITONIN,  WBC,  LACTICIDVEN Microbiology No results found for this or any previous visit (from the past 240 hour(s)).  Time coordinating discharge: 35 minutes  SIGNED:  Kerney Elbe, DO [facility] 08/25/2017, 2:47 PM Pager [phone]  If 7PM-7AM, please contact night-coverage [email or web address] Password [email or web address]

## 2017-08-20 NOTE — Progress Notes (Signed)
  Echocardiogram 2D Echocardiogram has been performed.  Christy Carpenter L Androw 08/20/2017, 9:58 AM

## 2017-08-20 NOTE — Discharge Instructions (Signed)
° °  Chest Wall Pain Chest wall pain is pain in or around the bones and muscles of your chest. Sometimes, an injury causes this pain. Sometimes, the cause may not be known. This pain may take several weeks or longer to get better. Follow these instructions at home: Pay attention to any changes in your symptoms. Take these actions to help with your pain:  Rest as told by your health care provider.  Avoid activities that cause pain. These include any activities that use your chest muscles or your abdominal and side muscles to lift heavy items.  If directed, apply ice to the painful area: ? Put ice in a plastic bag. ? Place a towel between your skin and the bag. ? Leave the ice on for 20 minutes, 2-3 times per day.  Take over-the-counter and prescription medicines only as told by your health care provider.  Do not use tobacco products, including cigarettes, chewing tobacco, and e-cigarettes. If you need help quitting, ask your health care provider.  Keep all follow-up visits as told by your health care provider. This is important.  Contact a health care provider if:  You have a fever.  Your chest pain becomes worse.  You have new symptoms. Get help right away if:  You have nausea or vomiting.  You feel sweaty or light-headed.  You have a cough with phlegm (sputum) or you cough up blood.  You develop shortness of breath. This information is not intended to replace advice given to you by your health care provider. Make sure you discuss any questions you have with your health care provider. Document Released: 08/03/2005 Document Revised: 12/12/2015 Document Reviewed: 10/29/2014 Elsevier Interactive Patient Education  [employer]. Call Dr Kyla Balzarine office or go to ER with increased pain or SOB

## 2017-08-20 NOTE — Progress Notes (Signed)
Spoke with patient about her diabetes. States that she was diagnosed about 10 years ago and sees Dr. Debbora Presto as her endocrinologist. Her mother and other family members had diabetes. States that she has tried Systems developer, Byetta, Januvia, 70/30 mixed insulin.  Is currently taking Novolin NPH 75 units at breakfast and bedtime and NPH 40 units at lunch.  In addition, takes Novolog 10-40 units sliding scale TID with meals according to her blood sugars. Also taking [place] and Metformin. Has been on Farxiga for at least 6 months.   Patient says that her HgbA1C is now 13.6%, but has been 9% in the past. She states that her blood sugars at home run 300-400 mg/dl.  Very knowledgeable about her diabetes, but knows that her eating has not been the best. Ordered Living Well with Diabetes booklet for her to take home.  Staff RN to give patient DM Exit notes at discharge.   Harvel Ricks RN BSN CDE Diabetes Coordinator Pager: [phone]  8am-5pm

## 2017-08-23 ENCOUNTER — Ambulatory Visit: Payer: BC Managed Care – PPO | Admitting: Cardiovascular Disease

## 2017-08-26 ENCOUNTER — Ambulatory Visit: Payer: BC Managed Care – PPO | Admitting: Cardiovascular Disease

## 2017-10-26 ENCOUNTER — Encounter: Payer: Self-pay | Admitting: Diagnostic Neuroimaging

## 2017-10-26 ENCOUNTER — Ambulatory Visit: Payer: BC Managed Care – PPO | Admitting: Diagnostic Neuroimaging

## 2017-10-26 VITALS — BP 128/81 | HR 103 | Ht 60.0 in | Wt 204.0 lb

## 2017-10-26 DIAGNOSIS — E1140 Type 2 diabetes mellitus with diabetic neuropathy, unspecified: Secondary | ICD-10-CM

## 2017-10-26 NOTE — Progress Notes (Signed)
[facility]  PATIENT: Christy Carpenter DOB: 10-23-1955  REFERRING CLINICIAN: Cyd Silence, MD  HISTORY FROM: patient and chart review  REASON FOR VISIT: new consult   HISTORICAL  CHIEF COMPLAINT:  Chief Complaint  Patient presents with  . NP  Paper Referral  Dr. Chalmers Cater  . Peripheral Neuropathy    bilateral feet, burning, pins and needles, goes up to calves. Tried gabapentin chanaged to lyrica 150mg  po TID.  Not helping.  Has foot swelling.  L foot plantar fascitis    HISTORY OF PRESENT ILLNESS:   62 year old female here for evaluation of diabetic peripheral neuropathy.  Patient has suboptimal control with hemoglobin A1c around 12.  Highest A1c in the past has been 14 and lowest was 9 (about 6 years ago).  Over the years patient developed gradual onset progressive numbness, tingling, pins and needles, pain in her toes, feet and ankles.  She has been on variety of medications including Lyrica, amitriptyline, gabapentin, meloxicam, topical neuropathy cream, alpha lipoic acid, without relief.  Patient also has left plantar fasciitis, currently wearing orthopedic boot.  Patient has been trying to improve her diet and nutrition.  Patient having difficult time with exercise due to pain.  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   REVIEW OF SYSTEMS: Full 14 system review of systems performed and negative with exception of: Only as per HPI.   ALLERGIES: Allergies  Allergen Reactions  . Byetta 10 Mcg Pen [Exenatide]   . Januvia [Sitagliptin]   . Invokana [Canagliflozin] Other (See Comments)    Yeast infections    HOME MEDICATIONS: Outpatient Medications Prior to Visit  Medication Sig Dispense Refill  . amitriptyline (ELAVIL) 25 MG tablet Take 25 mg by mouth at bedtime.    Marland Kitchen aspirin EC 81 MG EC tablet Take 1 tablet (81 mg total) by mouth daily. 30 tablet 0  . atorvastatin (LIPITOR) 40 MG tablet Take 1 tablet (40 mg total) by mouth daily at 6  PM. 30 tablet 0  . FARXIGA 10 MG TABS tablet Take 10 mg by mouth daily.  11  . insulin aspart (NOVOLOG) 100 UNIT/ML injection Inject 10-40 Units into the skin See admin instructions. 70-100  10 units 100-150  15 units 150-200=  20 units 201-250 =25 units 251-300= 30  Units 301-350= 35 units 351-400 = 40 units Over 400 call physician    . losartan-hydrochlorothiazide (HYZAAR) 100-25 MG tablet Take 1 tablet by mouth daily.    . metFORMIN (GLUCOPHAGE-XR) 500 MG 24 hr tablet Take 500 mg by mouth 2 (two) times daily.     Marland Kitchen NOVOLIN N 100 UNIT/ML injection Inject 40-75 Units into the skin See admin instructions. Take 75 units in the morning and the evening and 40 units at lunch    . omeprazole (PRILOSEC) 40 MG capsule Take 40 mg by mouth daily.    . pregabalin (LYRICA) 150 MG capsule Take 150 mg by mouth 3 (three) times daily.     Marland Kitchen gabapentin (NEURONTIN) 300 MG capsule Take 300 mg by mouth 3 (three) times daily.     No facility-administered medications prior to visit.     PAST MEDICAL HISTORY: Past Medical History:  Diagnosis Date  . Diabetes mellitus   . Hypercholesterolemia   . Hypertension   . Plantar fasciitis    Left foot    PAST SURGICAL HISTORY: Past Surgical History:  Procedure Laterality Date  . HAND LIGAMENT RECONSTRUCTION      FAMILY HISTORY: Family  History  Problem Relation Age of Onset  . Diabetes Mellitus II Mother   . Stroke Mother   . Hypertension Mother   . Stroke Brother   . Diabetes Mellitus II Brother   . Hypertension Brother     SOCIAL HISTORY:  Social History   Socioeconomic History  . Marital status: Single    Spouse name: Not on file  . Number of children: Not on file  . Years of education: Not on file  . Highest education level: Not on file  Social Needs  . Financial resource strain: Not on file  . Food insecurity - worry: Not on file  . Food insecurity - inability: Not on file  . Transportation needs - medical: Not on file  . Transportation  needs - non-medical: Not on file  Occupational History  . Not on file  Tobacco Use  . Smoking status: Never Smoker  . Smokeless tobacco: Never Used  Substance and Sexual Activity  . Alcohol use: No  . Drug use: No  . Sexual activity: Not on file  Other Topics Concern  . Not on file  Social History Narrative   Lives home with spouse.  Education Masters Degree.  Works at [employer].  2 Children.   Caffeine 1 cup coffee daily.      PHYSICAL EXAM  GENERAL EXAM/CONSTITUTIONAL: Vitals:  Vitals:   10/26/17 1439  BP: 128/81  Pulse: (!) 103  Weight: 204 lb (92.5 kg)  Height: 5' (1.524 m)     Body mass index is 39.84 kg/m.  Visual Acuity Screening   Right eye Left eye Both eyes  Without correction:     With correction: 20/40 20/50      Patient is in no distress; well developed, nourished and groomed; neck is supple  CARDIOVASCULAR:  Examination of carotid arteries is normal; no carotid bruits  Regular rate and rhythm, no murmurs  Examination of peripheral vascular system by observation and palpation is normal  EYES:  Ophthalmoscopic exam of optic discs and posterior segments is normal; no papilledema or hemorrhages  MUSCULOSKELETAL:  Gait, strength, tone, movements noted in Neurologic exam below  NEUROLOGIC: MENTAL STATUS:  No flowsheet data found.  awake, alert, oriented to person, place and time  recent and remote memory intact  normal attention and concentration  language fluent, comprehension intact, naming intact,   fund of knowledge appropriate  CRANIAL NERVE:   2nd - no papilledema on fundoscopic exam  2nd, 3rd, 4th, 6th - pupils equal and reactive to light, visual fields full to confrontation, extraocular muscles intact, no nystagmus; RIGHT PTOSIS  5th - facial sensation symmetric  7th - facial strength symmetric  8th - hearing intact  9th - palate elevates symmetrically, uvula midline  11th - shoulder shrug symmetric  12th - tongue  protrusion midline  MOTOR:   normal bulk and tone, full strength in the BUE, BLE; EXCEPT LIMITED IN LEFT DELTOID AND BILATERAL HIP FLEXION DUE TO PAIN  SENSORY:   normal and symmetric to light touch, temperature, vibration  EXCEPT DECR VIB IN TOES AND ANKLES  COORDINATION:   finger-nose-finger, fine finger movements normal  REFLEXES:   deep tendon reflexes TRACE and symmetric  GAIT/STATION:   ANTALGIC GAIT; LEFT FOOT IN BOOT    DIAGNOSTIC DATA (LABS, IMAGING, TESTING) - I reviewed patient records, labs, notes, testing and imaging myself where available.  Lab Results  Component Value Date   WBC 5.2 08/20/2017   HGB 13.3 08/20/2017   HCT 40.2 08/20/2017  MCV 87.4 08/20/2017   PLT 179 08/20/2017      Component Value Date/Time   NA 135 08/20/2017 0342   K 3.6 08/20/2017 0342   CL 99 (L) 08/20/2017 0342   CO2 27 08/20/2017 0342   GLUCOSE 241 (H) 08/20/2017 0342   BUN 22 (H) 08/20/2017 0342   CREATININE 0.93 08/20/2017 0342   CALCIUM 9.2 08/20/2017 0342   PROT 7.4 08/20/2017 0342   ALBUMIN 3.5 08/20/2017 0342   AST 40 08/20/2017 0342   ALT 35 08/20/2017 0342   ALKPHOS 71 08/20/2017 0342   BILITOT 0.9 08/20/2017 0342   GFRNONAA >60 08/20/2017 0342   GFRAA >60 08/20/2017 0342   Lab Results  Component Value Date   CHOL 251 (H) 08/20/2017   HDL 28 (L) 08/20/2017   LDLCALC UNABLE TO CALCULATE IF TRIGLYCERIDE OVER 400 mg/dL 08/20/2017   TRIG 1,001 (H) 08/20/2017   CHOLHDL 9.0 08/20/2017   No results found for: HGBA1C No results found for: VITAMINB12 No results found for: TSH      ASSESSMENT AND PLAN  62 y.o. year old female here with painful diabetic neuropathy, with suboptimal control of diabetes.  Dx:  1. Painful diabetic neuropathy ([place])      PLAN:  PAINFUL DIABETIC NEUROPATHY - considered MRI lumbar spine and EMG/NCS; will hold off for now and continue conservative approach - continue to improve diabetes control per Dr. Chalmers Cater - increase  amitriptyline to 50mg  at bedtime (may titrate up further to 100mg  at bedtime; could switch to duloxetine in future) - continue lyrica 150mg  three times a day (may increase up to 200mg  three times a day) - restart alpha lipoic acid (patient tried in the past) - consider pain mgmt clinic for other treatment options (patient would like to hold off for now)  RIGHT PTOSIS (since teenager; likely congenital) - monitor  Return if symptoms worsen or fail to improve, for return to Dr. Chalmers Cater.    Penni Bombard, MD 6/71/2458, 0:99 PM Certified in Neurology, Neurophysiology and Neuroimaging  [facility] [address] [place], [place] [address] [phone]

## 2017-10-26 NOTE — Patient Instructions (Signed)
PAINFUL DIABETIC NEUROPATHY - continue to improve diabetes control per Dr. Chalmers Cater  - increase amitriptyline to 50mg  at bedtime (may titrate up further to 100mg  at bedtime in future)  - continue lyrica 150mg  three times a day (may increase up to 200mg  three times a day in future)  - restart alpha lipoic acid

## 2018-12-19 IMAGING — DX DG CHEST 2V
2 series · 2 of 2 positions shown · non-contrast
Comparison: None.

CLINICAL DATA: Left shoulder pain with pain radiating down the left
arm. Diagnosed with pneumonia in July 2017. Still on
antibiotics. Swelling in the left foot. History of hypertension and
diabetes.

EXAM:
CHEST  2 VIEW

[chest pa]
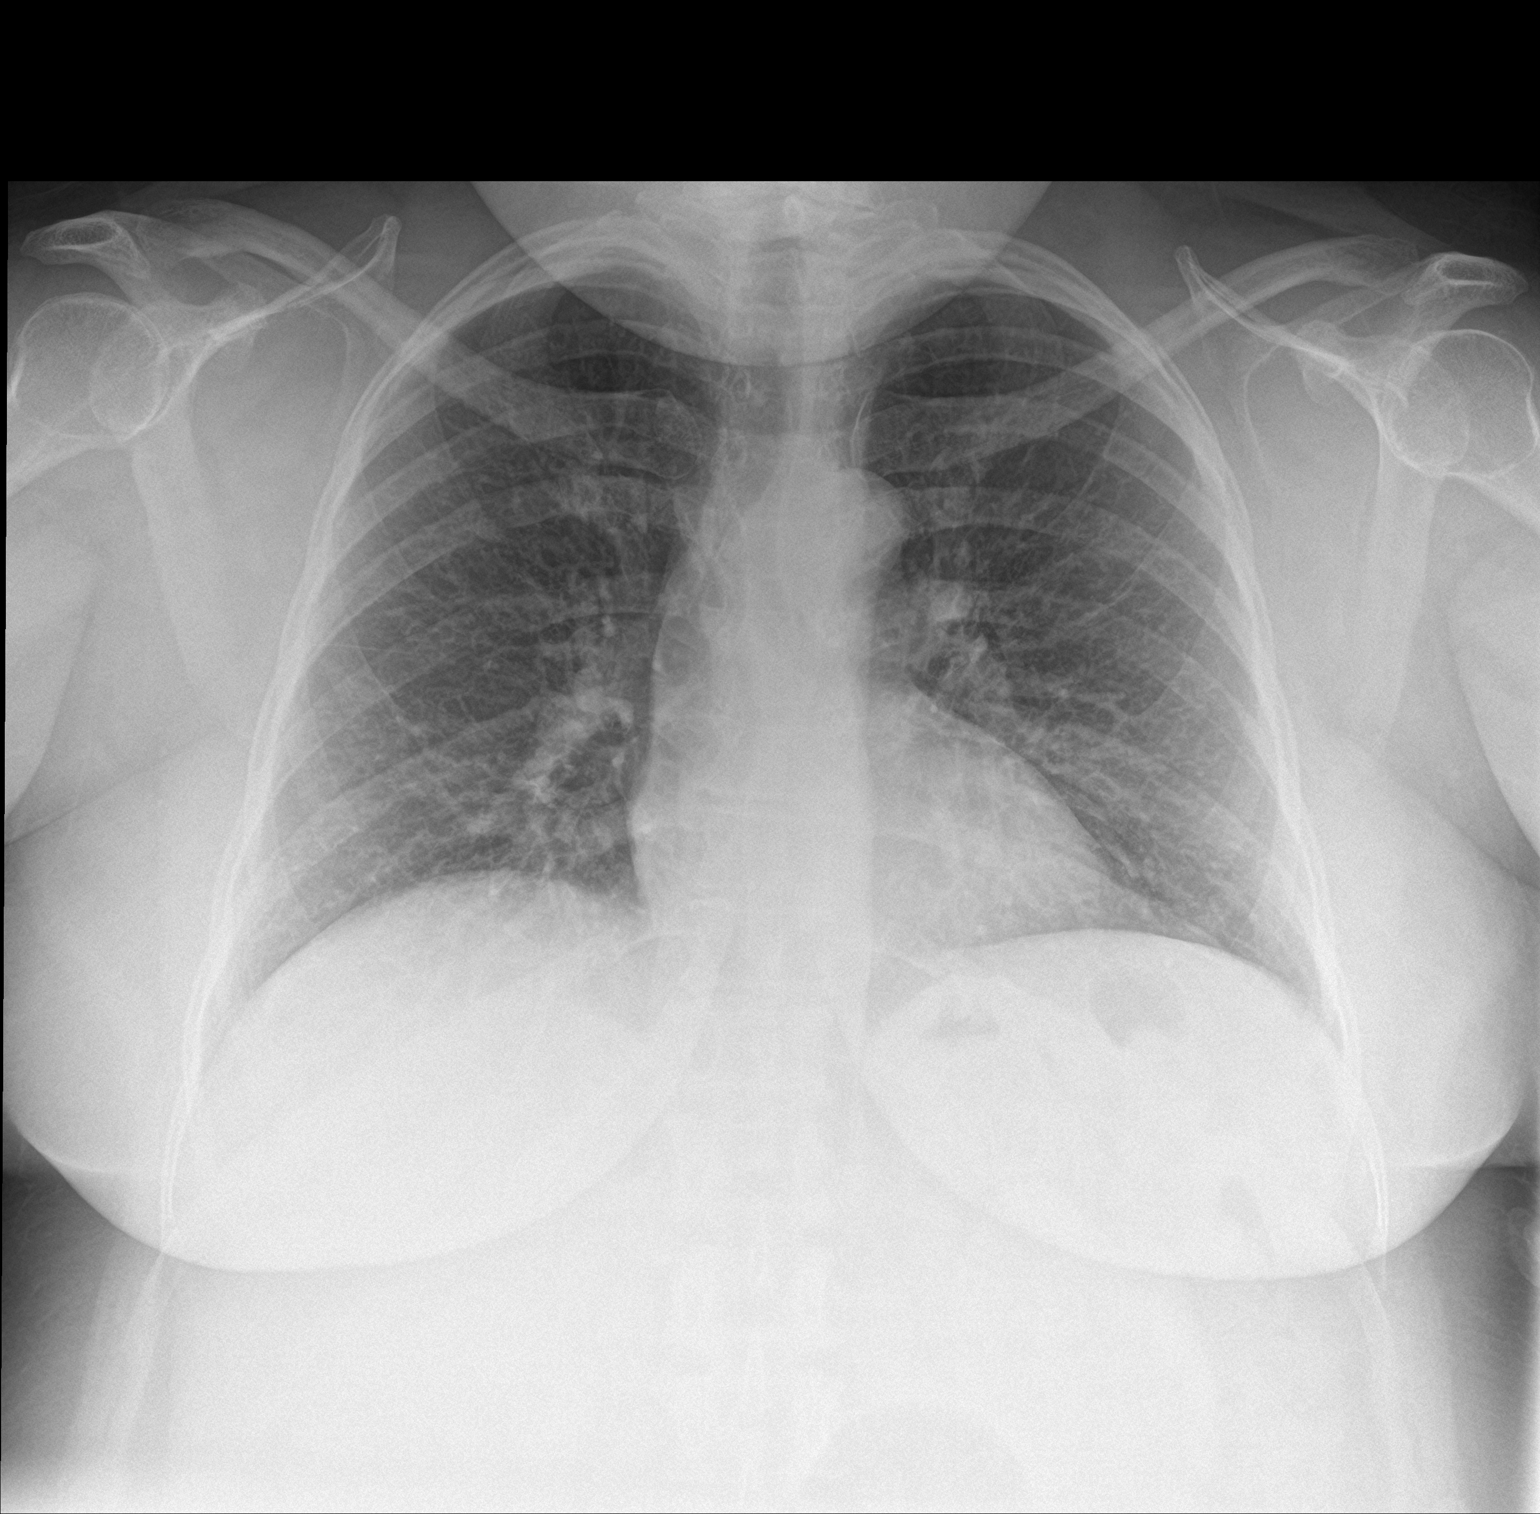

[chest lat]
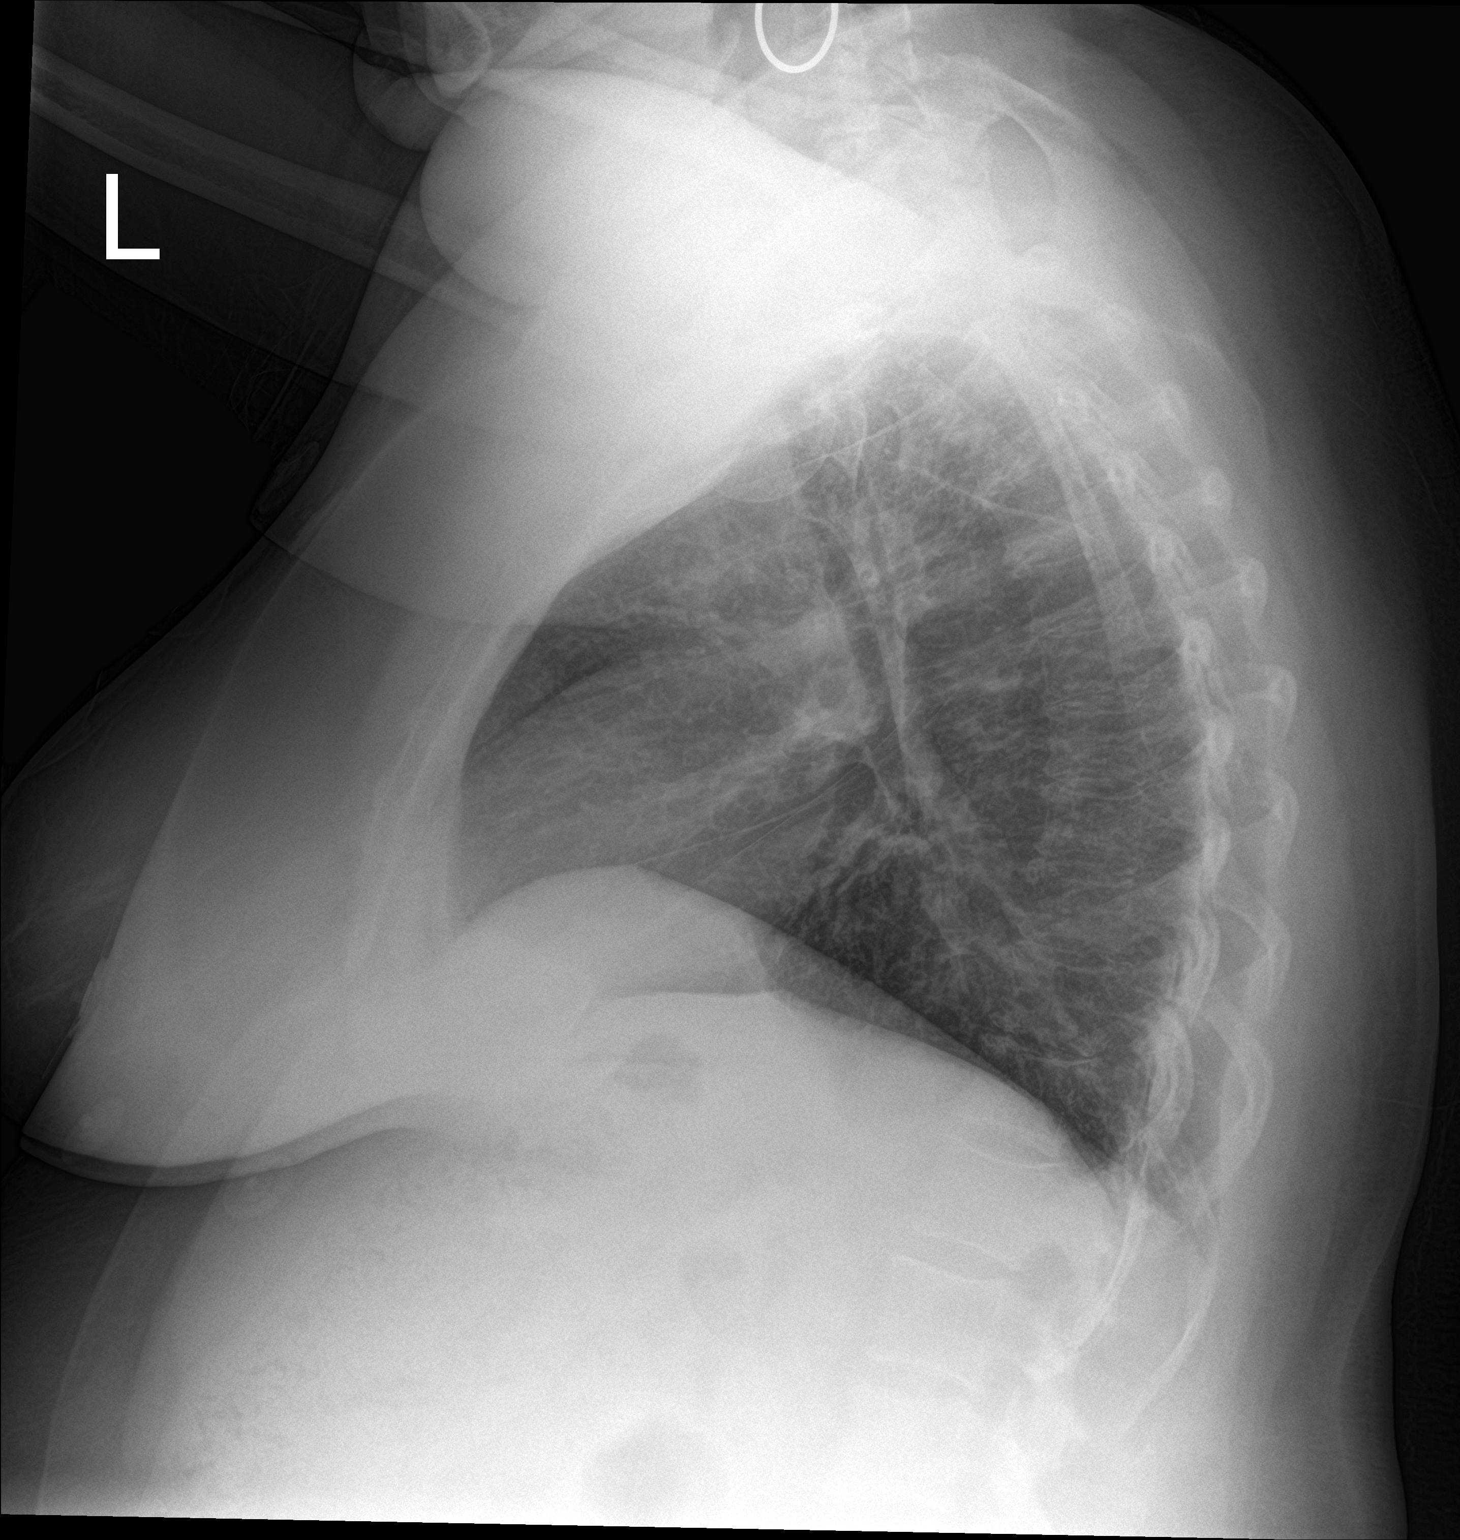

[2 of 2 positions shown; findings below may reference images not displayed]

FINDINGS: The heart size and mediastinal contours are within normal limits.
Both lungs are clear. The visualized skeletal structures are
unremarkable.
IMPRESSION: No active cardiopulmonary disease.

## 2019-05-02 ENCOUNTER — Other Ambulatory Visit: Payer: Self-pay

## 2019-05-02 ENCOUNTER — Encounter (HOSPITAL_COMMUNITY): Payer: Self-pay

## 2019-05-02 ENCOUNTER — Ambulatory Visit (HOSPITAL_COMMUNITY)
Admission: EM | Admit: 2019-05-02 | Discharge: 2019-05-02 | Disposition: A | Discharge status: Home / Self Care | Payer: BC Managed Care – PPO | Attending: Emergency Medicine | Admitting: Emergency Medicine

## 2019-05-02 DIAGNOSIS — M25512 Pain in left shoulder: Secondary | ICD-10-CM

## 2019-05-02 MED ORDER — IBUPROFEN 800 MG PO TABS: 800.0000 mg | ORAL_TABLET | Freq: Three times a day (TID) | ORAL | 0 refills | Status: DC | PRN

## 2019-05-02 NOTE — ED Triage Notes (Signed)
Pt states she feel in March and her left shoulder is in pain. Pt states her shoulder has been popping. Pt states her shoulder has been aching. Pt has a knot near her elbow.

## 2019-05-02 NOTE — Discharge Instructions (Addendum)
Take the ibuprofen as needed every 8 hours for pain.  Wear the sling for comfort.    Call the orthopedic tomorrow morning to schedule an appointment to be evaluated for your shoulder pain.

## 2019-05-02 NOTE — ED Provider Notes (Signed)
[facility]    CSN: [id number] Arrival date & time: 05/02/19  1819      History   Chief Complaint Chief Complaint  Patient presents with  . Fall    HPI Christy Carpenter is a 63 y.o. female.   Patient presents with left shoulder pain which began after a fall in March 2020.  She states she is unable to rotate her arm or lift above shoulder height.  She states she has been taking Tylenol for the pain which has helped some but is not helping now.  She denies paresthesias, weakness, numbness, or other symptoms.  The history is provided by the patient.    Past Medical History:  Diagnosis Date  . Diabetes mellitus   . Hypercholesterolemia   . Hypertension   . Plantar fasciitis    Left foot    Patient Active Problem List   Diagnosis Date Noted  . Chest pain 08/19/2017  . Uncontrolled type 2 diabetes mellitus with hyperglycemia ([place]) 08/19/2017  . Essential hypertension 08/19/2017  . Hypercholesterolemia     Past Surgical History:  Procedure Laterality Date  . HAND LIGAMENT RECONSTRUCTION      OB History   No obstetric history on file.      Home Medications    Prior to Admission medications   Medication Sig Start Date End Date Taking? Authorizing Provider  amitriptyline (ELAVIL) 25 MG tablet Take 25 mg by mouth at bedtime.    [provider]  aspirin EC 81 MG EC tablet Take 1 tablet (81 mg total) by mouth daily. 08/21/17   Raiford Noble Latif, DO  atorvastatin (LIPITOR) 40 MG tablet Take 1 tablet (40 mg total) by mouth daily at 6 PM. 08/20/17   Sheikh, Omair Latif, DO  FARXIGA 10 MG TABS tablet Take 10 mg by mouth daily. 07/16/17   [provider]  ibuprofen (ADVIL) 800 MG tablet Take 1 tablet (800 mg total) by mouth every 8 (eight) hours as needed. 05/02/19   Sharion Balloon, NP  insulin aspart (NOVOLOG) 100 UNIT/ML injection Inject 10-40 Units into the skin See admin instructions. 70-100  10 units 100-150  15 units 150-200=  20 units 201-250 =25  units 251-300= 30  Units 301-350= 35 units 351-400 = 40 units Over 400 call physician    [provider]  losartan-hydrochlorothiazide (HYZAAR) 100-25 MG tablet Take 1 tablet by mouth daily.    [provider]  metFORMIN (GLUCOPHAGE-XR) 500 MG 24 hr tablet Take 500 mg by mouth 2 (two) times daily.     [provider]  NOVOLIN N 100 UNIT/ML injection Inject 40-75 Units into the skin See admin instructions. Take 75 units in the morning and the evening and 40 units at lunch 08/18/17   [provider]  omeprazole (PRILOSEC) 40 MG capsule Take 40 mg by mouth daily.    [provider]  pregabalin (LYRICA) 150 MG capsule Take 150 mg by mouth 3 (three) times daily.     [provider]    Family History Family History  Problem Relation Age of Onset  . Diabetes Mellitus II Mother   . Stroke Mother   . Hypertension Mother   . Stroke Brother   . Diabetes Mellitus II Brother   . Hypertension Brother     Social History Social History   Tobacco Use  . Smoking status: Never Smoker  . Smokeless tobacco: Never Used  Substance Use Topics  . Alcohol use: No  . Drug  use: No     Allergies   Byetta 10 mcg pen [exenatide], Januvia [sitagliptin], and Invokana [canagliflozin]   Review of Systems Review of Systems  Constitutional: Negative for chills and fever.  HENT: Negative for ear pain and sore throat.   Eyes: Negative for pain and visual disturbance.  Respiratory: Negative for cough and shortness of breath.   Cardiovascular: Negative for chest pain and palpitations.  Gastrointestinal: Negative for abdominal pain and vomiting.  Genitourinary: Negative for dysuria and hematuria.  Musculoskeletal: Positive for arthralgias. Negative for back pain.  Skin: Negative for color change and rash.  Neurological: Negative for seizures and syncope.  All other systems reviewed and are negative.    Physical Exam Triage Vital Signs ED Triage Vitals   Enc Vitals Group     BP 05/02/19 1848 109/66     Pulse Rate 05/02/19 1848 94     Resp 05/02/19 1848 18     Temp 05/02/19 1848 98.6 F (37 C)     Temp src --      SpO2 05/02/19 1848 98 %     Weight --      Height --      Head Circumference --      Peak Flow --      Pain Score 05/02/19 1845 8     Pain Loc --      Pain Edu? --      Excl. in [place]? --    No data found.  Updated Vital Signs BP 109/66 (BP Location: Right Arm)   Pulse 94   Temp 98.6 F (37 C)   Resp 18   SpO2 98%   Visual Acuity Right Eye Distance:   Left Eye Distance:   Bilateral Distance:    Right Eye Near:   Left Eye Near:    Bilateral Near:     Physical Exam Vitals signs and nursing note reviewed.  Constitutional:      General: She is not in acute distress.    Appearance: She is well-developed.  HENT:     Head: Normocephalic and atraumatic.  Eyes:     Conjunctiva/sclera: Conjunctivae normal.  Neck:     Musculoskeletal: Neck supple.  Cardiovascular:     Rate and Rhythm: Normal rate and regular rhythm.     Heart sounds: No murmur.  Pulmonary:     Effort: Pulmonary effort is normal. No respiratory distress.     Breath sounds: Normal breath sounds.  Abdominal:     Palpations: Abdomen is soft.     Tenderness: There is no abdominal tenderness.  Musculoskeletal:        General: Tenderness present. No deformity.     Comments: Left shoulder ROM limited by pain.  Unable to raise above shoulder height and unable to internally rotate without pain.   Skin:    General: Skin is warm and dry.     Capillary Refill: Capillary refill takes less than 2 seconds.     Findings: No bruising, erythema, lesion or rash.  Neurological:     General: No focal deficit present.     Mental Status: She is alert and oriented to person, place, and time.     Sensory: No sensory deficit.     Motor: No weakness.      UC Treatments / Results  Labs (all labs ordered are listed, but only abnormal results are displayed)  Labs Reviewed - No data to display  EKG   Radiology No results found.  Procedures Procedures (  including critical care time)  Medications Ordered in UC Medications - No data to display  Initial Impression / Assessment and Plan / UC Course  I have reviewed the triage vital signs and the nursing notes.  Pertinent labs & imaging results that were available during my care of the patient were reviewed by me and considered in my medical decision making (see chart for details).     Left shoulder pain.  Treating with ibuprofen and a sling.  Instructed patient to wear the sling as needed for comfort.  Instructed patient that she should follow-up with orthopedics to have her shoulder pain evaluated.  Patient agrees with plan of care.     Final Clinical Impressions(s) / UC Diagnoses   Final diagnoses:  Pain in joint of left shoulder     Discharge Instructions     Take the ibuprofen as needed every 8 hours for pain.    Call the orthopedic tomorrow morning to schedule an appointment to be evaluated for your shoulder pain.        ED Prescriptions    Medication Sig Dispense Auth. Provider   ibuprofen (ADVIL) 800 MG tablet Take 1 tablet (800 mg total) by mouth every 8 (eight) hours as needed. 21 tablet Sharion Balloon, NP     Controlled Substance Prescriptions [place] Controlled Substance Registry consulted? Not Applicable   Sharion Balloon, NP 05/02/19 Doran Heater

## 2019-11-11 ENCOUNTER — Other Ambulatory Visit: Payer: Self-pay

## 2019-11-11 ENCOUNTER — Ambulatory Visit (HOSPITAL_COMMUNITY)
Admission: EM | Admit: 2019-11-11 | Discharge: 2019-11-11 | Disposition: A | Discharge status: Home / Self Care | Payer: BC Managed Care – PPO | Attending: Emergency Medicine | Admitting: Emergency Medicine

## 2019-11-11 ENCOUNTER — Encounter (HOSPITAL_COMMUNITY): Payer: Self-pay | Admitting: *Deleted

## 2019-11-11 DIAGNOSIS — N390 Urinary tract infection, site not specified: Secondary | ICD-10-CM

## 2019-11-11 DIAGNOSIS — E119 Type 2 diabetes mellitus without complications: Secondary | ICD-10-CM

## 2019-11-11 DIAGNOSIS — R319 Hematuria, unspecified: Secondary | ICD-10-CM

## 2019-11-11 LAB — GLUCOSE, CAPILLARY: Glucose-Capillary: 276 mg/dL — ABNORMAL HIGH (ref 70–99)

## 2019-11-11 LAB — CBG MONITORING, ED: Glucose-Capillary: 276 mg/dL — ABNORMAL HIGH (ref 70–99)

## 2019-11-11 MED ORDER — CEPHALEXIN 500 MG PO CAPS: 500.0000 mg | ORAL_CAPSULE | Freq: Four times a day (QID) | ORAL | 0 refills | Status: AC

## 2019-11-11 MED ORDER — PHENAZOPYRIDINE HCL 200 MG PO TABS: 200.0000 mg | ORAL_TABLET | Freq: Three times a day (TID) | ORAL | 0 refills | Status: DC | PRN

## 2019-11-11 NOTE — ED Triage Notes (Signed)
C/O dysuria, back pain, body aches, slight congestion, HA, poor appetite x 3 days without fever. Declines Covid test.  Had Covid vaccine 10/27/2019.

## 2019-11-11 NOTE — Discharge Instructions (Addendum)
Finish the Keflex, even if you feel better.  Pyridium will help with your urinary symptoms.  Push plenty of extra fluids.

## 2019-11-11 NOTE — ED Notes (Signed)
Patient is still unable to void at this time.  

## 2019-11-11 NOTE — ED Provider Notes (Signed)
HPI  SUBJECTIVE:  Christy Carpenter is a 64 y.o. female who presents with 3 days of constant generalized back and body aches.  She reports 2 days of dysuria, odorous urine.  She reports headache nasal congestion and dry cough that has resolved.  She reports decreased appetite.  No urinary urgency frequency cloudy urine, hematuria.  She has not been sexually active since December 2020.  No fevers sore throat loss of sense of smell or taste shortness of breath nausea vomiting diarrhea no specific abdominal pain.  Last bowel movement was 2 days ago.  This was normal stooling pattern for her.  No recent antibiotics.  She has been taking Tylenol severe cold and flu 2 tabs every 6 hours with some improvement in her symptoms.  No aggravating factors. she took Tylenol within 4 to 6 hours of evaluation.  She got the J&J Covid vaccine on 3/12.  No known Covid or flu exposure.  She did not get a flu shot this year.  No change in her medications recently.  She has a past medical history of uncontrolled diabetes, hemoglobin A1c is 14.  States that her sugars have been running a little lower than usual, has been in the 300-400 range for the past 2 days.  Denies being polydipsia.  She has a history of hypertension UTI hypercholesterolemia.  No history of pyelonephritis nephrolithiasis DKA pulmonary disease smoking coronary disease chronic kidney disease cancer HIV immunocompromise.  PMD: [facility] [place].   Past Medical History:  Diagnosis Date  . Diabetes mellitus   . Hypercholesterolemia   . Hypertension   . Plantar fasciitis    Left foot    Past Surgical History:  Procedure Laterality Date  . HAND LIGAMENT RECONSTRUCTION      Family History  Problem Relation Age of Onset  . Diabetes Mellitus II Mother   . Stroke Mother   . Hypertension Mother   . Stroke Brother   . Diabetes Mellitus II Brother   . Hypertension Brother     Social History   Tobacco Use  . Smoking status: Never Smoker  .  Smokeless tobacco: Never Used  Substance Use Topics  . Alcohol use: No  . Drug use: No    No current facility-administered medications for this encounter.  Current Outpatient Medications:  .  amitriptyline (ELAVIL) 25 MG tablet, Take 25 mg by mouth at bedtime., Disp: , Rfl:  .  atorvastatin (LIPITOR) 40 MG tablet, Take 1 tablet (40 mg total) by mouth daily at 6 PM., Disp: 30 tablet, Rfl: 0 .  insulin aspart protamine- aspart (NOVOLOG MIX 70/30) (70-30) 100 UNIT/ML injection, Inject 50 Units into the skin in the morning, at noon, and at bedtime., Disp: , Rfl:  .  losartan-hydrochlorothiazide (HYZAAR) 100-25 MG tablet, Take 1 tablet by mouth daily., Disp: , Rfl:  .  metFORMIN (GLUCOPHAGE-XR) 500 MG 24 hr tablet, Take 500 mg by mouth 2 (two) times daily. , Disp: , Rfl:  .  pregabalin (LYRICA) 150 MG capsule, Take 150 mg by mouth 3 (three) times daily. , Disp: , Rfl:  .  SITagliptin-metFORMIN HCl (JANUMET PO), Take by mouth., Disp: , Rfl:  .  aspirin EC 81 MG EC tablet, Take 1 tablet (81 mg total) by mouth daily., Disp: 30 tablet, Rfl: 0 .  cephALEXin (KEFLEX) 500 MG capsule, Take 1 capsule (500 mg total) by mouth 4 (four) times daily for 10 days., Disp: 40 capsule, Rfl: 0 .  FARXIGA 10 MG TABS tablet, Take  10 mg by mouth daily., Disp: , Rfl: 11 .  insulin aspart (NOVOLOG) 100 UNIT/ML injection, Inject 10-40 Units into the skin See admin instructions. 70-100  10 units 100-150  15 units 150-200=  20 units 201-250 =25 units 251-300= 30  Units 301-350= 35 units 351-400 = 40 units Over 400 call physician, Disp: , Rfl:  .  NOVOLIN N 100 UNIT/ML injection, Inject 40-75 Units into the skin See admin instructions. Take 75 units in the morning and the evening and 40 units at lunch, Disp: , Rfl:  .  omeprazole (PRILOSEC) 40 MG capsule, Take 40 mg by mouth daily., Disp: , Rfl:  .  phenazopyridine (PYRIDIUM) 200 MG tablet, Take 1 tablet (200 mg total) by mouth 3 (three) times daily as needed for pain., Disp: 6  tablet, Rfl: 0  Allergies  Allergen Reactions  . Byetta 10 Mcg Pen [Exenatide]   . Januvia [Sitagliptin]   . Invokana [Canagliflozin] Other (See Comments)    Yeast infections     ROS  As noted in HPI.   Physical Exam  BP 129/82   Pulse 98   Temp 99.9 F (37.7 C) (Oral)   Resp 18   SpO2 97%   Constitutional: Well developed, well nourished, no acute distress appears ill. Eyes: PERRL, EOMI, conjunctiva normal bilaterally HENT: Normocephalic, atraumatic,mucus membranes moist mild nasal congestion.  No sinus tenderness.  Respiratory: Clear to auscultation bilaterally, no rales, no wheezing, no rhonchi Cardiovascular: Normal rate and rhythm, no murmurs, no gallops, no rubs GI: Soft, nondistended, normal bowel sounds, nontender, no rebound, no guarding no suprapubic, flank tenderness.  No lower quadrant tenderness bilaterally Back: no CVAT skin: No rash, skin intact Musculoskeletal: No edema, no tenderness, no deformities Neurologic: Alert & oriented x 3, CN III-XII grossly intact, no motor deficits, sensation grossly intact Psychiatric: Speech and behavior appropriate   ED Course   Medications - No data to display  Orders Placed This Encounter  Procedures  . Glucose, capillary    Standing Status:   Standing    Number of Occurrences:   1  . POC CBG monitoring    Standing Status:   Standing    Number of Occurrences:   1   No results found for this or any previous visit (from the past 24 hour(s)). No results found.  ED Clinical Impression  1. Urinary tract infection with hematuria, site unspecified      ED Assessment/Plan   will check fingerstick.  Also UA Covid test.  Dates that her cough is resolved, her lungs are clear she is satting 97% on room air, doubt pneumonia, chest x-ray was deferred.  Glucose 276. Patient declined Covid test  Urine results not crossing over.  Large leuks positive nitrite 30 protein large blood small bili 100 glucose negative  ketones specific gravity 1.025.  There is not enough urine  to send off for culture.    will treat as a complicated UTI because she is an uncontrolled diabetic with Keflex 4 times daily for 10 days.  No evidence of pyelonephritis today. Pyridium, continue pushing fluids.  Follow-up with PMD in several days especially if not getting any better, to the ER if she gets worse  Discussed labs, MDM, treatment plan, and plan for follow-up with patient Discussed sn/sx that should prompt return to the ED. patient agrees with plan.   Meds ordered this encounter  Medications  . cephALEXin (KEFLEX) 500 MG capsule    Sig: Take 1 capsule (500 mg total) by  mouth 4 (four) times daily for 10 days.    Dispense:  40 capsule    Refill:  0  . phenazopyridine (PYRIDIUM) 200 MG tablet    Sig: Take 1 tablet (200 mg total) by mouth 3 (three) times daily as needed for pain.    Dispense:  6 tablet    Refill:  0    *This clinic note was created using [employer]. Therefore, there may be occasional mistakes despite careful proofreading.  ?   Melynda Ripple, MD 11/13/19 [phone]

## 2020-02-23 ENCOUNTER — Encounter: Payer: Self-pay | Admitting: Gynecologic Oncology

## 2020-02-23 ENCOUNTER — Inpatient Hospital Stay: Payer: BC Managed Care – PPO

## 2020-02-23 ENCOUNTER — Inpatient Hospital Stay: Payer: BC Managed Care – PPO | Attending: Gynecologic Oncology | Admitting: Gynecologic Oncology

## 2020-02-23 ENCOUNTER — Ambulatory Visit: Payer: BC Managed Care – PPO

## 2020-02-23 ENCOUNTER — Other Ambulatory Visit: Payer: Self-pay

## 2020-02-23 VITALS — BP 128/72 | HR 104 | Temp 98.8°F | Resp 18 | Ht 61.0 in | Wt 179.0 lb

## 2020-02-23 DIAGNOSIS — E669 Obesity, unspecified: Secondary | ICD-10-CM | POA: Diagnosis not present

## 2020-02-23 DIAGNOSIS — E66811 Obesity, class 1: Secondary | ICD-10-CM | POA: Insufficient documentation

## 2020-02-23 DIAGNOSIS — E1142 Type 2 diabetes mellitus with diabetic polyneuropathy: Secondary | ICD-10-CM

## 2020-02-23 DIAGNOSIS — C541 Malignant neoplasm of endometrium: Secondary | ICD-10-CM

## 2020-02-23 DIAGNOSIS — Z6834 Body mass index (BMI) 34.0-34.9, adult: Secondary | ICD-10-CM | POA: Insufficient documentation

## 2020-02-23 DIAGNOSIS — E1140 Type 2 diabetes mellitus with diabetic neuropathy, unspecified: Secondary | ICD-10-CM | POA: Insufficient documentation

## 2020-02-23 DIAGNOSIS — Z79899 Other long term (current) drug therapy: Secondary | ICD-10-CM | POA: Insufficient documentation

## 2020-02-23 DIAGNOSIS — E1165 Type 2 diabetes mellitus with hyperglycemia: Secondary | ICD-10-CM | POA: Diagnosis not present

## 2020-02-23 DIAGNOSIS — Z794 Long term (current) use of insulin: Secondary | ICD-10-CM | POA: Diagnosis not present

## 2020-02-23 DIAGNOSIS — I10 Essential (primary) hypertension: Secondary | ICD-10-CM | POA: Insufficient documentation

## 2020-02-23 DIAGNOSIS — E7800 Pure hypercholesterolemia, unspecified: Secondary | ICD-10-CM | POA: Insufficient documentation

## 2020-02-23 DIAGNOSIS — Z7982 Long term (current) use of aspirin: Secondary | ICD-10-CM | POA: Diagnosis not present

## 2020-02-23 LAB — COMPREHENSIVE METABOLIC PANEL
ALT: 23 U/L (ref 0–44)
AST: 24 U/L (ref 15–41)
Albumin: 3.7 g/dL (ref 3.5–5.0)
Alkaline Phosphatase: 54 U/L (ref 38–126)
Anion gap: 10 (ref 5–15)
BUN: 16 mg/dL (ref 8–23)
CO2: 24 mmol/L (ref 22–32)
Calcium: 9.3 mg/dL (ref 8.9–10.3)
Chloride: 104 mmol/L (ref 98–111)
Creatinine, Ser: 1.25 mg/dL — ABNORMAL HIGH (ref 0.44–1.00)
GFR calc Af Amer: 53 mL/min — ABNORMAL LOW (ref >60)
GFR calc non Af Amer: 46 mL/min — ABNORMAL LOW (ref >60)
Glucose, Bld: 259 mg/dL — ABNORMAL HIGH (ref 70–99)
Potassium: 3.8 mmol/L (ref 3.5–5.1)
Sodium: 138 mmol/L (ref 135–145)
Total Bilirubin: 0.5 mg/dL (ref 0.3–1.2)
Total Protein: 7.9 g/dL (ref 6.5–8.1)

## 2020-02-23 LAB — HEMOGLOBIN A1C
Hgb A1c MFr Bld: 10.7 % — ABNORMAL HIGH (ref 4.8–5.6)
Mean Plasma Glucose: 260.39 mg/dL

## 2020-02-23 NOTE — Progress Notes (Signed)
Consult Note: Gyn-Onc  Consult was requested by Dr. Benjie Karvonen for the evaluation of Christy Carpenter 64 y.o. female  CC:  Chief Complaint  Patient presents with  . Endometrial cancer [facility])    New Patient    Assessment/Plan:  Christy Carpenter  is a 64 y.o.  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  We will check HbA1C today. If this is >8.9% she will need to delay surgical intervention until after her blood sugars are better controlled. I explained to Aunesti that abdominal and pelvic surgery in the setting of poorly controlled diabetes is associated with a particularly high risk for serious infection and other complications. Given that there is a particularly effective medical therapy (oral progestins) available which can reverse endometrial cancer in 60% of cases, I recommend this as a temporizing treatment while she brings her blood glucose under control.  If she has an HbA1c <8.9% we will proceed with surgery. A detailed discussion was held with the patient and her husband with regard to to her endometrial cancer diagnosis. We discussed the standard management options for uterine cancer which includes surgery followed possibly by adjuvant therapy depending on the results of surgery. The options for surgical management include a hysterectomy and removal of the tubes and ovaries possibly with removal of pelvic and para-aortic lymph nodes.If feasible, a minimally invasive approach including a robotic hysterectomy or laparoscopic hysterectomy have benefits including shorter hospital stay, recovery time and better wound healing than with open surgery. The patient has been counseled about these surgical options and the risks of surgery in general including infection, bleeding, damage to surrounding structures (including bowel, bladder, ureters, nerves or vessels), and the postoperative risks of PE/ DVT, and lymphedema. I extensively  reviewed the additional risks of robotic hysterectomy including possible need for conversion to open laparotomy.  I discussed positioning during surgery of trendelenberg and risks of minor facial swelling and care we take in preoperative positioning.  After counseling and consideration of her options, she desires to proceed with robotic assisted total hysterectomy with bilateral sapingo-oophorectomy and SLN biopsy (provided blood glucose is well controlled).   She will be seen by anesthesia for preoperative clearance and discussion of postoperative pain management.  She was given the opportunity to ask questions, which were answered to her satisfaction, and she is agreement with the above mentioned plan of care.  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  The patient reported a 1 year history of postmenopausal bleeding. She attributed this bleeding to having a retained IUD. She did not have an OBGYN provider and so was not being evaluated. She reported only seeing her endocrinologist for surveillance of her diabetes on a regular basis. She has a PCP, however does not see that provider at regularly scheduled visits. She brought attention to the symptom of postmenopausal spotting to her endocrinologist on 01/19/20 who recommended OBGYN work up.  The patient saw Dr Benjie Karvonen on January 29, 2020 and a Pap smear was performed which was cytologically normal and negative for high-risk HPV.  Additionally she underwent ultrasound evaluation of the endometrium at that date which revealed a uterus of normal size, the endometrium is heterogeneous and thickened with vascularity and measured 27.3 mm.  A Lippe's Loop IUD was noted.  It was within the endometrial cavity.  There were normal ovaries and no adnexal masses.  She declined endometrial biopsy on that same day and returned at a separate visit on February 14, 2020 for endometrial sampling with a Pipelle biopsy.  This revealed FIGO grade 1 endometrioid adenocarcinoma.  The patient reported a past history of type 2 diabetes mellitus for which she takes insulin.  She is managed by an endocrinologist for this whom she sees regularly.  Her last documented HbA1c was 13% in January 2021.  At that time she was regularly having fasting blood glucoses in the 400-500 range.  Alterations were made in her insulin regimen and the patient began observing fasting blood glucoses in the 200s.  She rarely has blood glucoses less than 200 even in the fasting timeframe.  The patient's diabetes mellitus is complicated by peripheral neuropathy, particular in her feet.  She has some renal dysfunction though is unclear of the exact degree or nature of this.  She denies retinopathy or infectious complications.  The patient is obese with a BMI of 34 kg meters squared.  She has hypertension hypercholesterolemia.  The patient gynecologic history is remarkable for having 2 prior vaginal deliveries.  She had a an IUD for contraceptive purposes placed in 1984 and this remained present up until the time of her diagnosis endometrial cancer.  She did not seek regular gynecologic care for many decades.  Her only Pap test in 20 years time was that performed in June 2021 and was normal.  However she denied history of abnormal Pap test in the past.  The patient's family history is unremarkable for history of malignancy.  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  She lives with her husband who is of good health.    Current Meds:  Outpatient Encounter Medications as of 02/23/2020  Medication Sig  . amitriptyline (ELAVIL) 25 MG tablet Take 25 mg by mouth at bedtime.  Marland Kitchen aspirin EC 81 MG EC tablet Take 1 tablet (81 mg total) by mouth daily.  Marland Kitchen atorvastatin (LIPITOR) 40 MG tablet Take 1 tablet (40 mg total) by mouth daily at 6 PM.  . insulin aspart protamine-  aspart (NOVOLOG MIX 70/30) (70-30) 100 UNIT/ML injection Inject 50 Units into the skin in the morning, at noon, and at bedtime.  Marland Kitchen losartan-hydrochlorothiazide (HYZAAR) 100-25 MG tablet Take 1 tablet by mouth daily.  . metFORMIN (GLUCOPHAGE-XR) 500 MG 24 hr tablet Take 500 mg by mouth 2 (two) times daily.   . pregabalin (LYRICA) 150 MG capsule Take 150 mg by mouth 3 (three) times daily.   Marland Kitchen SITagliptin-metFORMIN HCl (JANUMET PO) Take by mouth.  . [DISCONTINUED] FARXIGA 10 MG TABS tablet Take 10 mg by mouth daily. (Patient not taking: Reported on 02/23/2020)  . [DISCONTINUED] insulin aspart (NOVOLOG) 100 UNIT/ML injection Inject 10-40 Units into the skin See admin instructions. 70-100  10 units 100-150  15 units 150-200=  20 units 201-250 =25 units 251-300= 30  Units 301-350= 35 units 351-400 = 40 units Over 400 call physician (Patient not taking: Reported on 02/23/2020)  . [DISCONTINUED] NOVOLIN N 100 UNIT/ML injection Inject 40-75 Units into the skin See admin instructions. Take 75 units in the morning and the evening and 40 units at lunch (Patient not taking: Reported on 02/23/2020)  . [DISCONTINUED] omeprazole (PRILOSEC) 40 MG capsule Take 40 mg by mouth daily. (Patient not taking: Reported on 02/23/2020)  . [DISCONTINUED] phenazopyridine (PYRIDIUM) 200 MG tablet Take 1 tablet (200 mg total) by  mouth 3 (three) times daily as needed for pain. (Patient not taking: Reported on 02/23/2020)   No facility-administered encounter medications on file as of 02/23/2020.    Allergy:  Allergies  Allergen Reactions  . Byetta 10 Mcg Pen [Exenatide]   . Januvia [Sitagliptin]   . Invokana [Canagliflozin] Other (See Comments)    Yeast infections    Social Hx:   Social History   Socioeconomic History  . Marital status: Single    Spouse name: Not on file  . Number of children: Not on file  . Years of education: Not on file  . Highest education level: Not on file  Occupational History  . Not on file  Tobacco Use   . Smoking status: Never Smoker  . Smokeless tobacco: Never Used  Vaping Use  . Vaping Use: Never used  Substance and Sexual Activity  . Alcohol use: No  . Drug use: No  . Sexual activity: Not on file  Other Topics Concern  . Not on file  Social History Narrative   Lives home with spouse.  Education Masters Degree.  Works at [employer].  2 Children.   Caffeine 1 cup coffee daily.    Social Determinants of Health   Financial Resource Strain:   . Difficulty of Paying Living Expenses:   Food Insecurity:   . Worried About Charity fundraiser in the Last Year:   . Arboriculturist in the Last Year:   Transportation Needs:   . Film/video editor (Medical):   Marland Kitchen Lack of Transportation (Non-Medical):   Physical Activity:   . Days of Exercise per Week:   . Minutes of Exercise per Session:   Stress:   . Feeling of Stress :   Social Connections:   . Frequency of Communication with Friends and Family:   . Frequency of Social Gatherings with Friends and Family:   . Attends Religious Services:   . Active Member of Clubs or Organizations:   . Attends Archivist Meetings:   Marland Kitchen Marital Status:   Intimate Partner Violence:   . Fear of Current or Ex-Partner:   . Emotionally Abused:   Marland Kitchen Physically Abused:   . Sexually Abused:     Past Surgical Hx:  Past Surgical History:  Procedure Laterality Date  . HAND LIGAMENT RECONSTRUCTION      Past Medical Hx:  Past Medical History:  Diagnosis Date  . Diabetes mellitus   . Hypercholesterolemia   . Hypertension   . Plantar fasciitis    Left foot    Past Gynecological History:  See HPI No LMP recorded. Patient is postmenopausal.  Family Hx:  Family History  Problem Relation Age of Onset  . Diabetes Mellitus II Mother   . Stroke Mother   . Hypertension Mother   . Stroke Brother   . Diabetes Mellitus II Brother   . Hypertension Brother   . Cancer Paternal Aunt        ovarian    Review of Systems:  Constitutional  Feels  well,    ENT Normal appearing ears and nares bilaterally Skin/Breast  No rash, sores, jaundice, itching, dryness Cardiovascular  No chest pain, shortness of breath, or edema  Pulmonary  No cough or wheeze.  Gastro Intestinal  No nausea, vomitting, or diarrhoea. No bright red blood per rectum, no abdominal pain, change in bowel movement, or constipation.  Genito Urinary  No frequency, urgency, dysuria, no bleeding Musculo Skeletal  No myalgia, arthralgia, joint swelling or  pain  Neurologic  No weakness, numbness, change in gait,  Psychology  No depression, anxiety, insomnia.   Vitals:  Blood pressure 128/72, pulse (!) 104, temperature 98.8 F (37.1 C), temperature source Oral, resp. rate 18, height 5\' 1"  (1.549 m), weight 179 lb (81.2 kg), SpO2 95 %.  Physical Exam: WD in NAD Neck  Supple NROM, without any enlargements.  Lymph Node Survey No cervical supraclavicular or inguinal adenopathy Cardiovascular  Pulse normal rate, regularity and rhythm. S1 and S2 normal.  Lungs  Clear to auscultation bilateraly, without wheezes/crackles/rhonchi. Good air movement.  Skin  No rash/lesions/breakdown  Psychiatry  Alert and oriented to person, place, and time  Abdomen  Normoactive bowel sounds, abdomen soft, non-tender and obese without evidence of hernia. Back No CVA tenderness Genito Urinary  Vulva/vagina: Normal external female genitalia.  No lesions. No discharge or bleeding.  Bladder/urethra:  No lesions or masses, well supported bladder  Vagina: smooth, normal, no lesions  Cervix: Normal appearing, no lesions. No IUD strings  Uterus: Small, bulky LUS, mobile, no parametrial involvement or nodularity.  Adnexa: no palpable masses. Rectal  deferred.  Extremities  No bilateral cyanosis, clubbing or edema.   Thereasa Solo, MD  02/23/2020, 1:32 PM

## 2020-02-23 NOTE — Patient Instructions (Addendum)
We will check a hemoglobin A1C today (picture of glucose over three months) along with a metabolic panel. If your hemoglobin A1C is 9 or greater, we will hold off on surgery until improvement with you taking Megace.  Preparing for your Surgery  Plan for surgery on March 07, 2020 with Dr. [name] at [place] will be scheduled for a robotic assisted total laparoscopic hysterectomy (removal of uterus and cervix), bilateral salpingo-oophorectomy (removal of both fallopian tubes and ovaries), sentinel lymph node biopsy, possible lymph node dissection.   Pre-operative Testing -You will receive a phone call from presurgical testing at [facility] to arrange for a pre-operative appointment over the phone, lab appointment, and COVID test. The COVID test normally happens 3 days prior to the surgery and they ask that you self quarantine after the test up until surgery to decrease chance of exposure.  -Bring your insurance card, copy of an advanced directive if applicable, medication list  -At that visit, you will be asked to sign a consent for a possible blood transfusion in case a transfusion becomes necessary during surgery.  The need for a blood transfusion is rare but having consent is a necessary part of your care.     -You should not be taking blood thinners or aspirin at least ten days prior to surgery unless instructed by your surgeon.  -Do not take supplements such as fish oil (omega 3), red yeast rice, turmeric before your surgery.   Day Before Surgery at [place] will be asked to take in a light diet the day before surgery. You will be advised you can have clear liquids after midnight and up until 3 hours before your surgery.    Eat a light diet the day before surgery.  Examples including soups, broths, toast, yogurt, mashed potatoes.  AVOID GAS PRODUCING FOODS. Things to avoid include carbonated beverages (fizzy beverages), raw fruits and raw vegetables, or beans.    If your bowels are filled with gas, your surgeon will have difficulty visualizing your pelvic organs which increases your surgical risks.  Your role in recovery Your role is to become active as soon as directed by your doctor, while still giving yourself time to heal.  Rest when you feel tired. You will be asked to do the following in order to speed your recovery:  - Cough and breathe deeply. This helps to clear and expand your lungs and can prevent pneumonia after surgery.  - [place]. Do mild physical activity. Walking or moving your legs help your circulation and body functions return to normal. Do not try to get up or walk alone the first time after surgery.   -If you develop swelling on one leg or the other, pain in the back of your leg, redness/warmth in one of your legs, please call the office or go to the Emergency Room to have a doppler to rule out a blood clot. For shortness of breath, chest pain-seek care in the Emergency Room as soon as possible. - Actively manage your pain. Managing your pain lets you move in comfort. We will ask you to rate your pain on a scale of zero to 10. It is your responsibility to tell your doctor or nurse where and how much you hurt so your pain can be treated.  Special Considerations -If you are diabetic, you may be placed on insulin after surgery to have closer control over your blood sugars to promote healing and recovery.  This does not mean that you will be discharged on insulin.  If applicable, your oral antidiabetics will be resumed when you are tolerating a solid diet.  -Your final pathology results from surgery should be available around one week after surgery and the results will be relayed to you when available.  -Dr. Lahoma Crocker is the surgeon that assists your GYN Oncologist with surgery.  If you end up staying the night, the next day after your surgery you will either see Dr. Denman George, Dr. Berline Lopes, or Dr. Lahoma Crocker.  -FMLA forms can be faxed to [phone] and please allow 5-7 business days for completion.  Pain Management After Surgery -You will be prescribed your pain medication and bowel regimen medications before surgery so that you can have these available when you are discharged from the hospital. The pain medication is for use ONLY AFTER surgery and a new prescription will not be given.   -Make sure that you have Tylenol and Ibuprofen at home to use on a regular basis after surgery for pain control. We recommend alternating the medications every hour to six hours since they work differently and are processed in the body differently for pain relief.  -Review the attached handout on narcotic use and their risks and side effects.   Bowel Regimen -You will be prescribed Sennakot-S to take nightly to prevent constipation especially if you are taking the narcotic pain medication intermittently.  It is important to prevent constipation and drink adequate amounts of liquids. You can stop taking this medication when you are not taking pain medication and you are back on your normal bowel routine.  Risks of Surgery Risks of surgery are low but include bleeding, infection, damage to surrounding structures, re-operation, blood clots, and very rarely death.   Blood Transfusion Information (For the consent to be signed before surgery)  We will be checking your blood type before surgery so in case of emergencies, we will know what type of blood you would need.                                            WHAT IS A BLOOD TRANSFUSION?  A transfusion is the replacement of blood or some of its parts. Blood is made up of multiple cells which provide different functions.  Red blood cells carry oxygen and are used for blood loss replacement.  White blood cells fight against infection.  Platelets control bleeding.  Plasma helps clot blood.  Other blood products are available for specialized  needs, such as hemophilia or other clotting disorders. BEFORE THE TRANSFUSION  Who gives blood for transfusions?   You may be able to donate blood to be used at a later date on yourself (autologous donation).  Relatives can be asked to donate blood. This is generally not any safer than if you have received blood from a stranger. The same precautions are taken to ensure safety when a relative's blood is donated.  Healthy volunteers who are fully evaluated to make sure their blood is safe. This is blood bank blood. Transfusion therapy is the safest it has ever been in the practice of medicine. Before blood is taken from a donor, a complete history is taken to make sure that person has no history of diseases nor engages in risky social behavior (examples are intravenous drug use or sexual activity with multiple partners). The donor's travel  history is screened to minimize risk of transmitting infections, such as malaria. The donated blood is tested for signs of infectious diseases, such as HIV and hepatitis. The blood is then tested to be sure it is compatible with you in order to minimize the chance of a transfusion reaction. If you or a relative donates blood, this is often done in anticipation of surgery and is not appropriate for emergency situations. It takes many days to process the donated blood. RISKS AND COMPLICATIONS Although transfusion therapy is very safe and saves many lives, the main dangers of transfusion include:   Getting an infectious disease.  Developing a transfusion reaction. This is an allergic reaction to something in the blood you were given. Every precaution is taken to prevent this. The decision to have a blood transfusion has been considered carefully by your caregiver before blood is given. Blood is not given unless the benefits outweigh the risks.  AFTER SURGERY INSTRUCTIONS  Return to work: 4-6 weeks if applicable  Activity: 1. Be up and out of the bed during the  day.  Take a nap if needed.  You may walk up steps but be careful and use the hand rail.  Stair climbing will tire you more than you think, you may need to stop part way and rest.   2. No lifting or straining for 6 weeks over 10 pounds. No pushing, pulling, straining for 6 weeks.  3. No driving for 1 week(s).  Do not drive if you are taking narcotic pain medicine and make sure that your reaction time has returned.   4. You can shower as soon as the next day after surgery. Shower daily.  Use soap and water on your incision and pat dry; don't rub.  No tub baths or submerging your body in water until cleared by your surgeon. If you have the soap that was given to you by pre-surgical testing that was used before surgery, you do not need to use it afterwards because this can irritate your incisions.   5. No sexual activity and nothing in the vagina for 8 weeks.  6. You may experience a small amount of clear drainage from your incisions, which is normal.  If the drainage persists, increases, or changes color please call the office.  7. Do not use creams, lotions, or ointments such as neosporin on your incisions after surgery until advised by your surgeon because they can cause removal of the dermabond glue on your incisions.    8. You may experience vaginal spotting after surgery or around the 6-8 week mark from surgery when the stitches at the top of the vagina begin to dissolve.  The spotting is normal but if you experience heavy bleeding, call our office.  9. Take Tylenol or ibuprofen first for pain and only use narcotic pain medication for severe pain not relieved by the Tylenol or Ibuprofen.  Monitor your Tylenol intake to a max of 4,000 mg in a 24 hour period. You can alternate these medications after surgery.  Diet: 1. Low sodium Heart Healthy Diet is recommended.  2. It is safe to use a laxative, such as Miralax or Colace, if you have difficulty moving your bowels. You have been prescribed  Sennakot at bedtime every evening to keep bowel movements regular and to prevent constipation.    Wound Care: 1. Keep clean and dry.  Shower daily.  Reasons to call the Doctor:  Fever - Oral temperature greater than 100.4 degrees Fahrenheit  Foul-smelling vaginal  discharge  Difficulty urinating  Nausea and vomiting  Increased pain at the site of the incision that is unrelieved with pain medicine.  Difficulty breathing with or without chest pain  New calf pain especially if only on one side  Sudden, continuing increased vaginal bleeding with or without clots.   Contacts: For questions or concerns you should contact:  Dr. [name] at [phone]  Joylene John, NP at [phone]  After Hours: call [phone] and have the GYN Oncologist paged/contacted  Megestrol tablets What is this medicine? MEGESTROL (me JES trol) belongs to a class of drugs known as progestins. Megestrol tablets are used to treat advanced breast or endometrial cancer. This medicine may be used for other purposes; ask your health care provider or pharmacist if you have questions. COMMON BRAND NAME(S): Megace What should I tell my health care provider before I take this medicine? They need to know if you have any of these conditions:  adrenal gland problems  history of blood clots of the legs, lungs, or other parts of the body  diabetes  kidney disease  liver disease  stroke  an unusual or allergic reaction to megestrol, other medicines, foods, dyes, or preservatives  pregnant or trying to get pregnant  breast-feeding How should I use this medicine? Take this medicine by mouth. Follow the directions on the prescription label. Do not take your medicine more often than directed. Take your doses at regular intervals. Do not stop taking except on the advice of your doctor or health care professional. Talk to your pediatrician regarding the use of this medicine in children. Special care may  be needed. Overdosage: If you think you have taken too much of this medicine contact a poison control center or emergency room at once. NOTE: This medicine is only for you. Do not share this medicine with others. What if I miss a dose? If you miss a dose, take it as soon as you can. If it is almost time for your next dose, take only that dose. Do not take double or extra doses. What may interact with this medicine? Do not take this medicine with any of the following medications:  dofetilide This medicine may also interact with the following medications:  indinavir This list may not describe all possible interactions. Give your health care provider a list of all the medicines, herbs, non-prescription drugs, or dietary supplements you use. Also tell them if you smoke, drink alcohol, or use illegal drugs. Some items may interact with your medicine. What should I watch for while using this medicine? Visit your doctor or health care professional for regular checks on your progress. Continue taking this medicine even if you feel better. It may take 2 months of regular use before you know if this medicine is working for your condition. This medicine can cause birth defects. Do not get pregnant while taking this drug. Females with child-bearing potential will need to have a negative pregnancy test before starting this medicine. Use an effective method of birth control while you are taking this medicine. If you think that you might be pregnant talk to your doctor right away. If you have diabetes, this medicine may affect blood sugar levels. Check your blood sugar and talk to your doctor or health care professional if you notice changes. What side effects may I notice from receiving this medicine? Side effects that you should report to your doctor or health care professional as soon as possible: Side effects that you should report to your  doctor or health care professional as soon as possible:  allergic  reactions like skin rash, itching or hives, swelling of the face, lips, or tongue  breathing problems  dizziness  increased blood pressure  signs and symptoms of a blood clot such as breathing problems; changes in vision; chest pain; severe, sudden headache; pain, swelling, warmth in the leg; trouble speaking; sudden numbness or weakness of the face, arm or leg  swelling of the ankles, feet, hands  unusually weak or tired  vomiting Side effects that usually do not require medical attention (report to your doctor or health care professional if they continue or are bothersome):  breakthrough menstrual bleeding  changes in sex drive or performance  diarrhea  gas  hot flashes or flushing  increased appetite  upset stomach  weight gain This list may not describe all possible side effects. Call your doctor for medical advice about side effects. You may report side effects to FDA at 1-800-FDA-1088. Where should I keep my medicine? Keep out of the reach of children. Store at controlled room temperature between 15 and 30 degrees C (59 and 86 degrees F). Protect from heat above 40 degrees C (104 degrees F). Throw away any unused medicine after the expiration date. NOTE: This sheet is a summary. It may not cover all possible information. If you have questions about this medicine, talk to your doctor, pharmacist, or health care provider.  2020 Elsevier/Gold Standard (2017-08-11 10:07:34)

## 2020-02-23 NOTE — Consult Note (Deleted)
Consult Note: Gyn-Onc  Consult was requested by Dr. Marland Kitchen for the evaluation of Christy Carpenter 64 y.o. female  CC:  Chief Complaint  Patient presents with  . Endometrial cancer [facility])    New Patient    Assessment/Plan:  [name]  is a 64 y.o.  year old ***   HPI: ***   Interval History: ***  Current Meds:  Outpatient Encounter Medications as of 02/23/2020  Medication Sig  . amitriptyline (ELAVIL) 25 MG tablet Take 25 mg by mouth at bedtime.  Marland Kitchen aspirin EC 81 MG EC tablet Take 1 tablet (81 mg total) by mouth daily.  Marland Kitchen atorvastatin (LIPITOR) 40 MG tablet Take 1 tablet (40 mg total) by mouth daily at 6 PM.  . insulin aspart protamine- aspart (NOVOLOG MIX 70/30) (70-30) 100 UNIT/ML injection Inject 50 Units into the skin in the morning, at noon, and at bedtime.  Marland Kitchen losartan-hydrochlorothiazide (HYZAAR) 100-25 MG tablet Take 1 tablet by mouth daily.  . metFORMIN (GLUCOPHAGE-XR) 500 MG 24 hr tablet Take 500 mg by mouth 2 (two) times daily.   . pregabalin (LYRICA) 150 MG capsule Take 150 mg by mouth 3 (three) times daily.   Marland Kitchen SITagliptin-metFORMIN HCl (JANUMET PO) Take by mouth.  . [DISCONTINUED] FARXIGA 10 MG TABS tablet Take 10 mg by mouth daily. (Patient not taking: Reported on 02/23/2020)  . [DISCONTINUED] insulin aspart (NOVOLOG) 100 UNIT/ML injection Inject 10-40 Units into the skin See admin instructions. 70-100  10 units 100-150  15 units 150-200=  20 units 201-250 =25 units 251-300= 30  Units 301-350= 35 units 351-400 = 40 units Over 400 call physician (Patient not taking: Reported on 02/23/2020)  . [DISCONTINUED] NOVOLIN N 100 UNIT/ML injection Inject 40-75 Units into the skin See admin instructions. Take 75 units in the morning and the evening and 40 units at lunch (Patient not taking: Reported on 02/23/2020)  . [DISCONTINUED] omeprazole (PRILOSEC) 40 MG capsule Take 40 mg by mouth daily. (Patient not taking: Reported on 02/23/2020)  . [DISCONTINUED] phenazopyridine (PYRIDIUM) 200 MG  tablet Take 1 tablet (200 mg total) by mouth 3 (three) times daily as needed for pain. (Patient not taking: Reported on 02/23/2020)   No facility-administered encounter medications on file as of 02/23/2020.    Allergy:  Allergies  Allergen Reactions  . Byetta 10 Mcg Pen [Exenatide]   . Januvia [Sitagliptin]   . Invokana [Canagliflozin] Other (See Comments)    Yeast infections    Social Hx:   Social History   Socioeconomic History  . Marital status: Single    Spouse name: Not on file  . Number of children: Not on file  . Years of education: Not on file  . Highest education level: Not on file  Occupational History  . Not on file  Tobacco Use  . Smoking status: Never Smoker  . Smokeless tobacco: Never Used  Vaping Use  . Vaping Use: Never used  Substance and Sexual Activity  . Alcohol use: No  . Drug use: No  . Sexual activity: Not on file  Other Topics Concern  . Not on file  Social History Narrative   Lives home with spouse.  Education Masters Degree.  Works at [employer].  2 Children.   Caffeine 1 cup coffee daily.    Social Determinants of Health   Financial Resource Strain:   . Difficulty of Paying Living Expenses:   Food Insecurity:   . Worried About Charity fundraiser in the Last Year:   .  Ran Out of Food in the Last Year:   Transportation Needs:   . Film/video editor (Medical):   Marland Kitchen Lack of Transportation (Non-Medical):   Physical Activity:   . Days of Exercise per Week:   . Minutes of Exercise per Session:   Stress:   . Feeling of Stress :   Social Connections:   . Frequency of Communication with Friends and Family:   . Frequency of Social Gatherings with Friends and Family:   . Attends Religious Services:   . Active Member of Clubs or Organizations:   . Attends Archivist Meetings:   Marland Kitchen Marital Status:   Intimate Partner Violence:   . Fear of Current or Ex-Partner:   . Emotionally Abused:   Marland Kitchen Physically Abused:   . Sexually Abused:      Past Surgical Hx:  Past Surgical History:  Procedure Laterality Date  . HAND LIGAMENT RECONSTRUCTION      Past Medical Hx:  Past Medical History:  Diagnosis Date  . Diabetes mellitus   . Hypercholesterolemia   . Hypertension   . Plantar fasciitis    Left foot    Past Gynecological History:  *** No LMP recorded. Patient is postmenopausal.  Family Hx:  Family History  Problem Relation Age of Onset  . Diabetes Mellitus II Mother   . Stroke Mother   . Hypertension Mother   . Stroke Brother   . Diabetes Mellitus II Brother   . Hypertension Brother   . Cancer Paternal Aunt        ovarian    Review of Systems:  Constitutional  Feels well,  ***  ENT Normal appearing ears and nares bilaterally Skin/Breast  No rash, sores, jaundice, itching, dryness Cardiovascular  No chest pain, shortness of breath, or edema  Pulmonary  No cough or wheeze.  Gastro Intestinal  No nausea, vomitting, or diarrhoea. No bright red blood per rectum, no abdominal pain, change in bowel movement, or constipation.  Genito Urinary  No frequency, urgency, dysuria, *** Musculo Skeletal  No myalgia, arthralgia, joint swelling or pain  Neurologic  No weakness, numbness, change in gait,  Psychology  No depression, anxiety, insomnia.   Vitals:  Blood pressure 128/72, pulse (!) 104, temperature 98.8 F (37.1 C), temperature source Oral, resp. rate 18, height 5\' 1"  (1.549 m), weight 179 lb (81.2 kg), SpO2 95 %.  Physical Exam: WD in NAD Neck  Supple NROM, without any enlargements.  Lymph Node Survey No cervical supraclavicular or inguinal adenopathy Cardiovascular  Pulse normal rate, regularity and rhythm. S1 and S2 normal.  Lungs  Clear to auscultation bilateraly, without wheezes/crackles/rhonchi. Good air movement.  Skin  No rash/lesions/breakdown  Psychiatry  Alert and oriented to person, place, and time  Abdomen  Normoactive bowel sounds, abdomen soft, non-tender and obese without  evidence of hernia. *** Back No CVA tenderness Genito Urinary  Vulva/vagina: Normal external female genitalia. ***  No lesions. No discharge or bleeding.  Bladder/urethra:  No lesions or masses, well supported bladder  Vagina: ***  Cervix: Normal appearing, no lesions.  Uterus: *** Small, mobile, no parametrial involvement or nodularity.  Adnexa: *** masses. Rectal  Good tone, no masses no cul de sac nodularity.  Extremities  No bilateral cyanosis, clubbing or edema.   Thereasa Solo, MD  02/23/2020, 1:30 PM

## 2020-02-26 ENCOUNTER — Telehealth: Payer: Self-pay

## 2020-02-26 DIAGNOSIS — C541 Malignant neoplasm of endometrium: Secondary | ICD-10-CM

## 2020-02-26 DIAGNOSIS — E1165 Type 2 diabetes mellitus with hyperglycemia: Secondary | ICD-10-CM

## 2020-02-26 MED ORDER — MEGESTROL ACETATE 40 MG PO TABS
80.0000 mg | ORAL_TABLET | Freq: Two times a day (BID) | ORAL | 2 refills | Status: DC
Start: 2020-02-26 — End: 2020-06-20

## 2020-02-26 NOTE — Telephone Encounter (Signed)
Labs and office note from 02-23-20 routed to Dr. Chalmers Cater for review.

## 2020-02-26 NOTE — Telephone Encounter (Signed)
Told Ms Duggin that her surgery needs to be postponed due to her Hg A1c being high at 10.7.  Dr. Denman George wants her to begin Megace 80 mg bid for 2 months to control the endometrial cancer/bleeding until she can have surgery. Reviewed side effects of Megace including potential for increase in appetite, DVT and symptoms to look for including swelling of legs,pain with bearing weight, and redness and heat in leg. Reviewed elevated creatine and recommended increasing fluids to stay hydrated and avoid NSAIDS She will have repeat Hgb A1c on 05-02-20 and see Dr. Denman George on 05-06-20 for follow up visit. Will send labs and office note to Dr. Chalmers Cater. Suggested she contact Dr. Almetta Lovely office to discuss plan to get BS under better control in the next couple of months. Pt verbalized understanding.

## 2020-03-04 ENCOUNTER — Other Ambulatory Visit (HOSPITAL_COMMUNITY): Payer: BC Managed Care – PPO

## 2020-03-06 ENCOUNTER — Other Ambulatory Visit (HOSPITAL_COMMUNITY): Payer: BC Managed Care – PPO

## 2020-03-07 ENCOUNTER — Ambulatory Visit: Admit: 2020-03-07 | Payer: BC Managed Care – PPO | Admitting: Gynecologic Oncology

## 2020-03-07 SURGERY — HYSTERECTOMY, TOTAL, ROBOT-ASSISTED, LAPAROSCOPIC, WITH BILATERAL SALPINGO-OOPHORECTOMY
Anesthesia: General

## 2020-04-03 ENCOUNTER — Other Ambulatory Visit: Payer: Self-pay | Admitting: Gynecologic Oncology

## 2020-04-03 ENCOUNTER — Telehealth: Payer: Self-pay

## 2020-04-03 DIAGNOSIS — M25559 Pain in unspecified hip: Secondary | ICD-10-CM

## 2020-04-03 DIAGNOSIS — C541 Malignant neoplasm of endometrium: Secondary | ICD-10-CM

## 2020-04-03 NOTE — Progress Notes (Signed)
See RN note.

## 2020-04-03 NOTE — Telephone Encounter (Signed)
Christy Carpenter states that the chest tightness is gone. Pt feels it is gas. Denies SOB. She states that she has a constant ache in her lower abdomen for several weeks. The pain is currently a 4/10.  It can suddenly become a 6/10.  She occasionally takes tylenol for the discomfort. She denies urinary symptoms. She is taking the Megace as prescribed. Denied redness, pain, and swelling in calves. She is able to bear weight in legs with out pain. Reviewed with Christy Cross,NP.  Will obtain an US pelvic complete.  Will call Christy Carpenter with appointment once scheduled.  Pt verbalized understanding.

## 2020-04-04 NOTE — Telephone Encounter (Signed)
Scheduled the patient for an [place] at [facility] on [address]. Called and gave the patient the appt date/time and instructions; along with address and phone number for the clinic

## 2020-04-06 ENCOUNTER — Ambulatory Visit (HOSPITAL_COMMUNITY)
Admission: EM | Admit: 2020-04-06 | Discharge: 2020-04-06 | Disposition: A | Discharge status: Home / Self Care | Payer: BC Managed Care – PPO | Attending: Family Medicine | Admitting: Family Medicine

## 2020-04-06 ENCOUNTER — Other Ambulatory Visit: Payer: Self-pay

## 2020-04-06 ENCOUNTER — Encounter (HOSPITAL_COMMUNITY): Payer: Self-pay

## 2020-04-06 ENCOUNTER — Ambulatory Visit (INDEPENDENT_AMBULATORY_CARE_PROVIDER_SITE_OTHER): Payer: BC Managed Care – PPO

## 2020-04-06 DIAGNOSIS — R739 Hyperglycemia, unspecified: Secondary | ICD-10-CM | POA: Diagnosis not present

## 2020-04-06 DIAGNOSIS — E1165 Type 2 diabetes mellitus with hyperglycemia: Secondary | ICD-10-CM | POA: Diagnosis not present

## 2020-04-06 DIAGNOSIS — R079 Chest pain, unspecified: Secondary | ICD-10-CM

## 2020-04-06 DIAGNOSIS — R0789 Other chest pain: Secondary | ICD-10-CM

## 2020-04-06 DIAGNOSIS — E7800 Pure hypercholesterolemia, unspecified: Secondary | ICD-10-CM | POA: Insufficient documentation

## 2020-04-06 DIAGNOSIS — I10 Essential (primary) hypertension: Secondary | ICD-10-CM | POA: Insufficient documentation

## 2020-04-06 DIAGNOSIS — Z1152 Encounter for screening for COVID-19: Secondary | ICD-10-CM

## 2020-04-06 DIAGNOSIS — R5383 Other fatigue: Secondary | ICD-10-CM | POA: Diagnosis not present

## 2020-04-06 DIAGNOSIS — R81 Glycosuria: Secondary | ICD-10-CM | POA: Diagnosis not present

## 2020-04-06 DIAGNOSIS — R05 Cough: Secondary | ICD-10-CM | POA: Diagnosis not present

## 2020-04-06 DIAGNOSIS — E119 Type 2 diabetes mellitus without complications: Secondary | ICD-10-CM

## 2020-04-06 DIAGNOSIS — R059 Cough, unspecified: Secondary | ICD-10-CM

## 2020-04-06 DIAGNOSIS — Z794 Long term (current) use of insulin: Secondary | ICD-10-CM | POA: Insufficient documentation

## 2020-04-06 DIAGNOSIS — R0781 Pleurodynia: Secondary | ICD-10-CM | POA: Diagnosis not present

## 2020-04-06 DIAGNOSIS — Z7982 Long term (current) use of aspirin: Secondary | ICD-10-CM | POA: Insufficient documentation

## 2020-04-06 DIAGNOSIS — Z20822 Contact with and (suspected) exposure to covid-19: Secondary | ICD-10-CM | POA: Insufficient documentation

## 2020-04-06 DIAGNOSIS — R0602 Shortness of breath: Secondary | ICD-10-CM

## 2020-04-06 DIAGNOSIS — Z79899 Other long term (current) drug therapy: Secondary | ICD-10-CM | POA: Insufficient documentation

## 2020-04-06 DIAGNOSIS — B349 Viral infection, unspecified: Secondary | ICD-10-CM

## 2020-04-06 LAB — POCT URINALYSIS DIPSTICK, ED / UC
Bilirubin Urine: NEGATIVE
Glucose, UA: 500 mg/dL — AB
Ketones, ur: NEGATIVE mg/dL
Nitrite: NEGATIVE
Protein, ur: 30 mg/dL — AB
Specific Gravity, Urine: 1.01 (ref 1.005–1.030)
Urobilinogen, UA: 0.2 mg/dL (ref 0.0–1.0)
pH: 5.5 (ref 5.0–8.0)

## 2020-04-06 LAB — CBG MONITORING, ED: Glucose-Capillary: 381 mg/dL — ABNORMAL HIGH (ref 70–99)

## 2020-04-06 MED ORDER — FLUTICASONE-SALMETEROL 250-50 MCG/DOSE IN AEPB: 1.0000 | INHALATION_SPRAY | Freq: Two times a day (BID) | RESPIRATORY_TRACT | 0 refills | Status: DC

## 2020-04-06 MED ORDER — BENZONATATE 100 MG PO CAPS: 100.0000 mg | ORAL_CAPSULE | Freq: Three times a day (TID) | ORAL | 0 refills | Status: DC

## 2020-04-06 NOTE — ED Triage Notes (Addendum)
Pt present a non productive cough, symptoms started 3 days ago. Pt states when she cough it hurts her ribs really bad

## 2020-04-06 NOTE — Discharge Instructions (Addendum)
Your COVID test is pending.  You should self quarantine until the test result is back.    Take Tylenol as needed for fever or discomfort.  Rest and keep yourself hydrated.    Go to the emergency department if you develop acute worsening symptoms.    I have sent in [place] for cough  I have also sent in advair as an inhaler for you to use twice a day

## 2020-04-07 LAB — SARS CORONAVIRUS 2 (TAT 6-24 HRS): SARS Coronavirus 2: NEGATIVE

## 2020-04-08 NOTE — ED Provider Notes (Signed)
[place]   [id number] 04/06/20 Arrival Time: 5638   CC: COVID symptoms  SUBJECTIVE: History from: patient.  Christy Carpenter is a 64 y.o. female who presents with multiple complaints. Abrupt onset of nasal congestion, PND, rib pain and persistent dry cough for 3 days. Denies sick exposure to COVID, flu or strep. Denies recent travel. Has negative history of Covid. Has not completed Covid vaccines. Has not taken OTC medications for this. There are no aggravating or alleviating factors. Denies previous symptoms in the past. Denies fever, chills, fatigue, sinus pain, rhinorrhea, sore throat, SOB, wheezing, chest pain, nausea, changes in bowel or bladder habits.    Also reports that her blood sugars have been out of control at home lately. Reports that she was recently dx with uterine and endometrial cancer. Attributing hyperglycemia to this. Reports that she was to have hysterectomy but cannot get blood sugars under control. Reports fatigue, polyuria, polydipsia as well. Has been checking at home and states that her meter has recently been reading high. Uses insulin at home to try to manage diabetes.  ROS: As per HPI.  All other pertinent ROS negative.     Past Medical History:  Diagnosis Date  . Diabetes mellitus   . Hypercholesterolemia   . Hypertension   . Plantar fasciitis    Left foot   Past Surgical History:  Procedure Laterality Date  . HAND LIGAMENT RECONSTRUCTION     Allergies  Allergen Reactions  . Byetta 10 Mcg Pen [Exenatide]   . Januvia [Sitagliptin]   . Invokana [Canagliflozin] Other (See Comments)    Yeast infections   No current facility-administered medications on file prior to encounter.   Current Outpatient Medications on File Prior to Encounter  Medication Sig Dispense Refill  . amitriptyline (ELAVIL) 25 MG tablet Take 25 mg by mouth at bedtime.    Marland Kitchen aspirin EC 81 MG EC tablet Take 1 tablet (81 mg total) by mouth daily. 30 tablet 0  . atorvastatin  (LIPITOR) 40 MG tablet Take 1 tablet (40 mg total) by mouth daily at 6 PM. 30 tablet 0  . insulin aspart protamine- aspart (NOVOLOG MIX 70/30) (70-30) 100 UNIT/ML injection Inject 50 Units into the skin in the morning, at noon, and at bedtime.    Marland Kitchen losartan-hydrochlorothiazide (HYZAAR) 100-25 MG tablet Take 1 tablet by mouth daily.    . megestrol (MEGACE) 40 MG tablet Take 2 tablets (80 mg total) by mouth 2 (two) times daily. 120 tablet 2  . metFORMIN (GLUCOPHAGE-XR) 500 MG 24 hr tablet Take 500 mg by mouth 2 (two) times daily.     . pregabalin (LYRICA) 150 MG capsule Take 150 mg by mouth 3 (three) times daily.     Marland Kitchen SITagliptin-metFORMIN HCl (JANUMET PO) Take by mouth.     Social History   Socioeconomic History  . Marital status: Single    Spouse name: Not on file  . Number of children: Not on file  . Years of education: Not on file  . Highest education level: Not on file  Occupational History  . Not on file  Tobacco Use  . Smoking status: Never Smoker  . Smokeless tobacco: Never Used  Vaping Use  . Vaping Use: Never used  Substance and Sexual Activity  . Alcohol use: No  . Drug use: No  . Sexual activity: Not on file  Other Topics Concern  . Not on file  Social History Narrative   Lives home with spouse.  Education Masters Degree.  Works at [employer].  2 Children.   Caffeine 1 cup coffee daily.    Social Determinants of Health   Financial Resource Strain:   . Difficulty of Paying Living Expenses: Not on file  Food Insecurity:   . Worried About Charity fundraiser in the Last Year: Not on file  . Ran Out of Food in the Last Year: Not on file  Transportation Needs:   . Lack of Transportation (Medical): Not on file  . Lack of Transportation (Non-Medical): Not on file  Physical Activity:   . Days of Exercise per Week: Not on file  . Minutes of Exercise per Session: Not on file  Stress:   . Feeling of Stress : Not on file  Social Connections:   . Frequency of Communication with  Friends and Family: Not on file  . Frequency of Social Gatherings with Friends and Family: Not on file  . Attends Religious Services: Not on file  . Active Member of Clubs or Organizations: Not on file  . Attends Archivist Meetings: Not on file  . Marital Status: Not on file  Intimate Partner Violence:   . Fear of Current or Ex-Partner: Not on file  . Emotionally Abused: Not on file  . Physically Abused: Not on file  . Sexually Abused: Not on file   Family History  Problem Relation Age of Onset  . Diabetes Mellitus II Mother   . Stroke Mother   . Hypertension Mother   . Stroke Brother   . Diabetes Mellitus II Brother   . Hypertension Brother   . Cancer Paternal Aunt        ovarian    OBJECTIVE:  Vitals:   04/06/20 1318  BP: (!) 152/46  Pulse: (!) 101  Resp: 16  Temp: 98.7 F (37.1 C)  TempSrc: Oral  SpO2: 99%     General appearance: alert; appears fatigued, but nontoxic; speaking in full sentences and tolerating own secretions HEENT: NCAT; Ears: EACs clear, TMs pearly gray; Eyes: PERRL.  EOM grossly intact. Sinuses: nontender; Nose: nares patent without rhinorrhea, Throat: oropharynx clear, tonsils non erythematous or enlarged, uvula midline  Neck: supple without LAD Lungs: unlabored respirations, symmetrical air entry; cough: moderate; no respiratory distress; diffuse wheezing to bilateral lung fields, diminished lung sounds in bases Heart: regular rate and rhythm.  Radial pulses 2+ symmetrical bilaterally Skin: warm and dry Psychological: alert and cooperative; normal mood and affect  LABS:  No results found for this or any previous visit (from the past 24 hour(s)).   ASSESSMENT & PLAN:  1. Viral illness   2. Hyperglycemia   3. Cough   4. Burning chest pain   5. Pleuritic pain   6. Other fatigue   7. Glucosuria   8. Encounter for screening for COVID-19     Meds ordered this encounter  Medications  . benzonatate (TESSALON) 100 MG capsule     Sig: Take 1 capsule (100 mg total) by mouth every 8 (eight) hours.    Dispense:  21 capsule    Refill:  0    Order Specific Question:   Supervising Provider    Answer:   Chase Picket [id number]  . Fluticasone-Salmeterol (ADVAIR DISKUS) 250-50 MCG/DOSE AEPB    Sig: Inhale 1 puff into the lungs in the morning and at bedtime.    Dispense:  60 each    Refill:  0    Order Specific Question:   Supervising Provider    Answer:  LAMPTEY, PHILIP O [[id number]]   CBG 381 UA + for glucose, blood, protein, unremarkable for infection, neg ketonuria Unable to get blood for other labs  Prescribed advair inhaler Prescribed tessalon perles No steroids due to uncontrolled blood sugars and do not want to make this worse  Suspected DKA but UA is ok for now Discussed that she would be better served in the ER for blood sugar management She declines at this time Strict ER precautions given   COVID testing ordered.  It will take between 1-2 days for test results.  Someone will contact you regarding abnormal results.    Patient should remain in quarantine until they have received Covid results.  If negative you may resume normal activities (go back to work/school) while practicing hand hygiene, social distance, and mask wearing.  If positive, patient should remain in quarantine for 10 days from symptom onset AND greater than 72 hours after symptoms resolution (absence of fever without the use of fever-reducing medication and improvement in respiratory symptoms), whichever is longer Get plenty of rest and push fluids Use OTC zyrtec for nasal congestion, runny nose, and/or sore throat Use OTC flonase for nasal congestion and runny nose Use medications daily for symptom relief Use OTC medications like ibuprofen or tylenol as needed fever or pain Call or go to the ED if you have any new or worsening symptoms such as fever, worsening cough, shortness of breath, chest tightness, chest pain, turning blue, changes  in mental status.  Reviewed expectations re: course of current medical issues. Questions answered. Outlined signs and symptoms indicating need for more acute intervention. Patient verbalized understanding. After Visit Summary given.         Faustino Congress, NP 04/08/20 1100

## 2020-04-09 NOTE — Telephone Encounter (Signed)
Scheduled for 04-10-20

## 2020-04-10 ENCOUNTER — Ambulatory Visit
Admission: RE | Admit: 2020-04-10 | Discharge: 2020-04-10 | Disposition: A | Discharge status: Home / Self Care | Payer: BC Managed Care – PPO | Source: Ambulatory Visit | Attending: Gynecologic Oncology | Admitting: Gynecologic Oncology

## 2020-04-10 ENCOUNTER — Other Ambulatory Visit: Payer: Self-pay

## 2020-04-10 DIAGNOSIS — M25559 Pain in unspecified hip: Secondary | ICD-10-CM | POA: Insufficient documentation

## 2020-04-10 DIAGNOSIS — C541 Malignant neoplasm of endometrium: Secondary | ICD-10-CM | POA: Insufficient documentation

## 2020-04-11 ENCOUNTER — Telehealth: Payer: Self-pay

## 2020-04-11 NOTE — Telephone Encounter (Signed)
Told Christy Carpenter that that the US shows the thickened endometrium which is expected. Told her that her [place] with Dr. Benjie Karvonen done 02-08-20 showed the endometrium to be heterogenous an thickened measuring up to 27.3 mm. [place] 04-10-20 shows lining measuring 22 mm which is an improvement. IUD in place and Ovaries appear normal.  Pt continues with same abdominal pain. She did not pick up the prescription for the megace 40 mg tabs to take 2 tabs bid sent in by Christy John, Christy Carpenter on 02-26-20. Pt has been taking megace 40 mg daily from a prescription from Dr. Benjie Karvonen.  She stated that she just refilled the medication and and the pharmacy did not tell her there was a new prescription. Pt will take the megace 80 mg bid going forward. BS not controled Suggested that she call Dr. Chalmers Cater with am BS and keep close contact with Dr. Almetta Lovely office if medication adjustments are not effective. Pt verbalized understanding.

## 2020-05-02 ENCOUNTER — Other Ambulatory Visit: Payer: Self-pay

## 2020-05-02 ENCOUNTER — Inpatient Hospital Stay: Payer: BC Managed Care – PPO | Attending: Gynecologic Oncology

## 2020-05-02 DIAGNOSIS — Z6834 Body mass index (BMI) 34.0-34.9, adult: Secondary | ICD-10-CM | POA: Insufficient documentation

## 2020-05-02 DIAGNOSIS — E1165 Type 2 diabetes mellitus with hyperglycemia: Secondary | ICD-10-CM | POA: Diagnosis not present

## 2020-05-02 DIAGNOSIS — E7800 Pure hypercholesterolemia, unspecified: Secondary | ICD-10-CM | POA: Diagnosis not present

## 2020-05-02 DIAGNOSIS — I10 Essential (primary) hypertension: Secondary | ICD-10-CM | POA: Insufficient documentation

## 2020-05-02 DIAGNOSIS — C541 Malignant neoplasm of endometrium: Secondary | ICD-10-CM | POA: Diagnosis present

## 2020-05-02 DIAGNOSIS — E669 Obesity, unspecified: Secondary | ICD-10-CM | POA: Diagnosis not present

## 2020-05-02 DIAGNOSIS — Z833 Family history of diabetes mellitus: Secondary | ICD-10-CM | POA: Insufficient documentation

## 2020-05-02 DIAGNOSIS — Z7982 Long term (current) use of aspirin: Secondary | ICD-10-CM | POA: Diagnosis not present

## 2020-05-02 DIAGNOSIS — Z79899 Other long term (current) drug therapy: Secondary | ICD-10-CM | POA: Insufficient documentation

## 2020-05-02 DIAGNOSIS — Z7951 Long term (current) use of inhaled steroids: Secondary | ICD-10-CM | POA: Insufficient documentation

## 2020-05-02 DIAGNOSIS — Z8249 Family history of ischemic heart disease and other diseases of the circulatory system: Secondary | ICD-10-CM | POA: Insufficient documentation

## 2020-05-02 DIAGNOSIS — Z794 Long term (current) use of insulin: Secondary | ICD-10-CM | POA: Insufficient documentation

## 2020-05-02 LAB — HEMOGLOBIN A1C
Hgb A1c MFr Bld: 11.1 % — ABNORMAL HIGH (ref 4.8–5.6)
Mean Plasma Glucose: 271.87 mg/dL

## 2020-05-06 ENCOUNTER — Inpatient Hospital Stay (HOSPITAL_BASED_OUTPATIENT_CLINIC_OR_DEPARTMENT_OTHER): Payer: BC Managed Care – PPO | Admitting: Gynecologic Oncology

## 2020-05-06 ENCOUNTER — Other Ambulatory Visit: Payer: Self-pay

## 2020-05-06 ENCOUNTER — Ambulatory Visit (HOSPITAL_COMMUNITY)
Admission: RE | Admit: 2020-05-06 | Discharge: 2020-05-06 | Disposition: A | Discharge status: Home / Self Care | Payer: BC Managed Care – PPO | Source: Ambulatory Visit | Attending: Gynecologic Oncology | Admitting: Gynecologic Oncology

## 2020-05-06 ENCOUNTER — Encounter: Payer: Self-pay | Admitting: Gynecologic Oncology

## 2020-05-06 VITALS — BP 138/80 | HR 103 | Temp 97.7°F | Resp 16 | Ht 61.0 in | Wt 189.4 lb

## 2020-05-06 DIAGNOSIS — C541 Malignant neoplasm of endometrium: Secondary | ICD-10-CM

## 2020-05-06 DIAGNOSIS — R2241 Localized swelling, mass and lump, right lower limb: Secondary | ICD-10-CM

## 2020-05-06 NOTE — Progress Notes (Signed)
Follow-up Note: Gyn-Onc  Consult was requested by Dr. Benjie Karvonen for the evaluation of Christy Carpenter 64 y.o. female  CC:  Chief Complaint  Patient presents with  . Cancer    endometrial cancer    Assessment/Plan:  [name]  is a 64 y.o.  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 Surgery delayed due to poor blood sugar control.  She appears to have somewhat better control with continuous blood glucose monitoring. She is no longer spiking at levels >600.   I am going to set a tentative OR date for 06/04/20. I encourage her to see her endocrinologist in early October to verify that her glucose levels are "safe" for surgery.  If not, we will consider her for a D&C and progestin releasing IUD.  Surgery will consist of a robotic assisted total hysterectomy, BSO, SLN biopsy.   She has a right posterior thigh "lump". I favor a bakers cyst, but we will rule out DVT with dopplers.   HPI: [name] is a 64 year old P2 who was seen in consultation at the request of Dr Benjie Karvonen for evaluation of grade 1 endometrioid endometrial adenocarcinoma.  The patient reported a 1 year history of postmenopausal bleeding. She attributed this bleeding to having a retained IUD. She did not have an OBGYN provider and so was not being evaluated. She reported only seeing her endocrinologist for surveillance of her diabetes on a regular basis. She has a PCP, however does not see that provider at regularly scheduled visits. She brought attention to the symptom of postmenopausal spotting to her endocrinologist on 01/19/20 who recommended OBGYN work up.  The patient saw Dr Benjie Karvonen on January 29, 2020 and a Pap smear was performed which was cytologically normal and negative for high-risk HPV.  Additionally she underwent ultrasound evaluation of the endometrium at that date which revealed a uterus of normal size, the endometrium is heterogeneous and  thickened with vascularity and measured 27.3 mm.  A Lippe's Loop IUD was noted.  It was within the endometrial cavity.  There were normal ovaries and no adnexal masses.  She declined endometrial biopsy on that same day and returned at a separate visit on February 14, 2020 for endometrial sampling with a Pipelle biopsy.  This revealed FIGO grade 1 endometrioid adenocarcinoma.  The patient reported a past history of type 2 diabetes mellitus for which she takes insulin.  She is managed by an endocrinologist for this whom she sees regularly.  Her last documented HbA1c was 13% in January 2021.  At that time she was regularly having fasting blood glucoses in the 400-500 range.  Alterations were made in her insulin regimen and the patient began observing fasting blood glucoses in the 200s.  She rarely has blood glucoses less than 200 even in the fasting timeframe.  The patient's diabetes mellitus is complicated by peripheral neuropathy, particular in her feet.  She has some renal dysfunction though is unclear of the exact degree or nature of this.  She denies retinopathy or infectious complications.  The patient is obese with a BMI of 34 kg meters squared.  She has hypertension hypercholesterolemia.  Interval Hx:  Her surgery was initially delayed due to very poor diabetes control (HbAc1 of 10.7%). She was prescribed megace which facilitated increased food consumption due to appetite stimulation.  Her HbA1c was rechecked in September and had increased to 11.1%. However, her endocrinologist prescribed a freestyle  libre continuous blood glucose monitoring system and with this she has stopped having spikes in blood glucose that were too high to read (>600). According to the patient, these were happening regularly before September, 2021.  She has no further bleeding on Megace.  She has noticed the development of a mildly tender right posterior thigh (distal, medial) mass.   Current Meds:  Outpatient Encounter  Medications as of 05/06/2020  Medication Sig  . amitriptyline (ELAVIL) 25 MG tablet Take 25 mg by mouth at bedtime.  Marland Kitchen atorvastatin (LIPITOR) 40 MG tablet Take 1 tablet (40 mg total) by mouth daily at 6 PM.  . benzonatate (TESSALON) 100 MG capsule Take 1 capsule (100 mg total) by mouth every 8 (eight) hours. (Patient taking differently: Take 100 mg by mouth 3 (three) times daily as needed. )  . Fluticasone-Salmeterol (ADVAIR DISKUS) 250-50 MCG/DOSE AEPB Inhale 1 puff into the lungs in the morning and at bedtime.  Marland Kitchen losartan-hydrochlorothiazide (HYZAAR) 100-25 MG tablet Take 1 tablet by mouth daily.  . megestrol (MEGACE) 40 MG tablet Take 2 tablets (80 mg total) by mouth 2 (two) times daily.  . metFORMIN (GLUCOPHAGE-XR) 500 MG 24 hr tablet Take 500 mg by mouth 2 (two) times daily.   Marland Kitchen NOVOLIN N FLEXPEN 100 UNIT/ML Kiwkpen Inject 75 Units into the skin in the morning, at noon, and at bedtime.   Marland Kitchen NOVOLIN R FLEXPEN 100 UNIT/ML SOPN Inject 75 Units into the skin in the morning, at noon, and at bedtime.   Glory Rosebush ULTRA test strip 1 each 3 (three) times daily.  . pregabalin (LYRICA) 150 MG capsule Take 150 mg by mouth 3 (three) times daily.   . insulin aspart protamine- aspart (NOVOLOG MIX 70/30) (70-30) 100 UNIT/ML injection Inject 50 Units into the skin in the morning, at noon, and at bedtime. (Patient not taking: Reported on 05/06/2020)  . NOVOLIN 70/30 FLEXPEN (70-30) 100 UNIT/ML KwikPen SMARTSIG:50 Unit(s) SUB-Q 3 Times Daily (Patient not taking: Reported on 05/06/2020)  . [DISCONTINUED] aspirin EC 81 MG EC tablet Take 1 tablet (81 mg total) by mouth daily.  . [DISCONTINUED] SITagliptin-metFORMIN HCl (JANUMET PO) Take by mouth.   No facility-administered encounter medications on file as of 05/06/2020.    Allergy:  Allergies  Allergen Reactions  . Byetta 10 Mcg Pen [Exenatide]   . Januvia [Sitagliptin]   . Invokana [Canagliflozin] Other (See Comments)    Yeast infections    Social Hx:    Social History   Socioeconomic History  . Marital status: Single    Spouse name: Not on file  . Number of children: Not on file  . Years of education: Not on file  . Highest education level: Not on file  Occupational History  . Not on file  Tobacco Use  . Smoking status: Never Smoker  . Smokeless tobacco: Never Used  Vaping Use  . Vaping Use: Never used  Substance and Sexual Activity  . Alcohol use: No  . Drug use: No  . Sexual activity: Not on file  Other Topics Concern  . Not on file  Social History Narrative   Lives home with spouse.  Education Masters Degree.  Works at [employer].  2 Children.   Caffeine 1 cup coffee daily.    Social Determinants of Health   Financial Resource Strain:   . Difficulty of Paying Living Expenses: Not on file  Food Insecurity:   . Worried About Charity fundraiser in the Last Year: Not on file  .  Ran Out of Food in the Last Year: Not on file  Transportation Needs:   . Lack of Transportation (Medical): Not on file  . Lack of Transportation (Non-Medical): Not on file  Physical Activity:   . Days of Exercise per Week: Not on file  . Minutes of Exercise per Session: Not on file  Stress:   . Feeling of Stress : Not on file  Social Connections:   . Frequency of Communication with Friends and Family: Not on file  . Frequency of Social Gatherings with Friends and Family: Not on file  . Attends Religious Services: Not on file  . Active Member of Clubs or Organizations: Not on file  . Attends Archivist Meetings: Not on file  . Marital Status: Not on file  Intimate Partner Violence:   . Fear of Current or Ex-Partner: Not on file  . Emotionally Abused: Not on file  . Physically Abused: Not on file  . Sexually Abused: Not on file    Past Surgical Hx:  Past Surgical History:  Procedure Laterality Date  . HAND LIGAMENT RECONSTRUCTION      Past Medical Hx:  Past Medical History:  Diagnosis Date  . Diabetes mellitus   .  Hypercholesterolemia   . Hypertension   . Plantar fasciitis    Left foot    Past Gynecological History:  See HPI No LMP recorded. Patient is postmenopausal.  Family Hx:  Family History  Problem Relation Age of Onset  . Diabetes Mellitus II Mother   . Stroke Mother   . Hypertension Mother   . Stroke Brother   . Diabetes Mellitus II Brother   . Hypertension Brother   . Cancer Paternal Aunt        ovarian    Review of Systems:  Constitutional  Feels well,    ENT Normal appearing ears and nares bilaterally Skin/Breast  No rash, sores, jaundice, itching, dryness Cardiovascular  No chest pain, shortness of breath, or edema  Pulmonary  No cough or wheeze.  Gastro Intestinal  No nausea, vomitting, or diarrhoea. No bright red blood per rectum, no abdominal pain, change in bowel movement, or constipation.  Genito Urinary  No frequency, urgency, dysuria, no bleeding Musculo Skeletal  No myalgia, arthralgia, joint swelling or pain  Neurologic  No weakness, numbness, change in gait,  Psychology  No depression, anxiety, insomnia.   Vitals:  Blood pressure 138/80, pulse (!) 103, temperature 97.7 F (36.5 C), temperature source Tympanic, resp. rate 16, height 5\' 1"  (1.549 m), weight 189 lb 6.4 oz (85.9 kg), SpO2 99 %.  Physical Exam: WD in NAD Neck  Supple NROM, without any enlargements.  Lymph Node Survey No cervical supraclavicular or inguinal adenopathy Cardiovascular  Pulse normal rate, regularity and rhythm. S1 and S2 normal.  Lungs  Clear to auscultation bilateraly, without wheezes/crackles/rhonchi. Good air movement.  Skin  No rash/lesions/breakdown  Psychiatry  Alert and oriented to person, place, and time  Abdomen  Normoactive bowel sounds, abdomen soft, non-tender and obese without evidence of hernia. Back No CVA tenderness Genito Urinary  Vulva/vagina: Normal external female genitalia.  No lesions. No discharge or bleeding.  Bladder/urethra:  No lesions  or masses, well supported bladder  Vagina: smooth, normal, no lesions  Cervix: Normal appearing, no lesions. No IUD strings  Uterus: Small, bulky LUS, mobile, no parametrial involvement or nodularity.  Adnexa: no palpable masses. Rectal  deferred.  Extremities  No bilateral cyanosis, clubbing or edema. Right posterior thigh - no redness,  heat, edema. 1cm palpable nodular area above hamstrings tendon (medial).    Thereasa Solo, MD  05/06/2020, 2:29 PM

## 2020-05-06 NOTE — Patient Instructions (Signed)
Plan to have a doppler to evaluate the right lower extremity and check for a blood clot.  Plan to continue with care under your endocrinologist and follow up as planned at the beginning of October. We will hold a spot for surgery for you on June 04, 2020. Pre-op instructions discussed at your last visit are below:  Preparing for your Surgery  Plan for surgery on June 04, 2020 with Dr. [name] at [facility] will be scheduled for a robotic assisted total laparoscopic hysterectomy (removal of uterus and cervix), bilateral salpingo-oophorectomy (removal of both fallopian tubes and ovaries), sentinel lymph node biopsy, possible lymph node dissection.   Pre-operative Testing -You will receive a phone call from presurgical testing at [facility] to arrange for a pre-operative appointment over the phone, lab appointment, and COVID test. The COVID test normally happens 3 days prior to the surgery and they ask that you self quarantine after the test up until surgery to decrease chance of exposure.  -Bring your insurance card, copy of an advanced directive if applicable, medication list  -At that visit, you will be asked to sign a consent for a possible blood transfusion in case a transfusion becomes necessary during surgery.  The need for a blood transfusion is rare but having consent is a necessary part of your care.     -You should not be taking blood thinners or aspirin at least ten days prior to surgery unless instructed by your surgeon.  -Do not take supplements such as fish oil (omega 3), red yeast rice, turmeric before your surgery.   Day Before Surgery at [place] will be asked to take in a light diet the day before surgery. You will be advised you can have clear liquids after midnight and up until 3 hours before your surgery.    Eat a light diet the day before surgery.  Examples including soups, broths, toast, yogurt, mashed potatoes.  AVOID GAS PRODUCING  FOODS. Things to avoid include carbonated beverages (fizzy beverages), raw fruits and raw vegetables, or beans.   If your bowels are filled with gas, your surgeon will have difficulty visualizing your pelvic organs which increases your surgical risks.  Your role in recovery Your role is to become active as soon as directed by your doctor, while still giving yourself time to heal.  Rest when you feel tired. You will be asked to do the following in order to speed your recovery:  - Cough and breathe deeply. This helps to clear and expand your lungs and can prevent pneumonia after surgery.  - [place]. Do mild physical activity. Walking or moving your legs help your circulation and body functions return to normal. Do not try to get up or walk alone the first time after surgery.   -If you develop swelling on one leg or the other, pain in the back of your leg, redness/warmth in one of your legs, please call the office or go to the Emergency Room to have a doppler to rule out a blood clot. For shortness of breath, chest pain-seek care in the Emergency Room as soon as possible. - Actively manage your pain. Managing your pain lets you move in comfort. We will ask you to rate your pain on a scale of zero to 10. It is your responsibility to tell your doctor or nurse where and how much you hurt so your pain can be treated.  Special Considerations -If you are diabetic, you  may be placed on insulin after surgery to have closer control over your blood sugars to promote healing and recovery.  This does not mean that you will be discharged on insulin.  If applicable, your oral antidiabetics will be resumed when you are tolerating a solid diet.  -Your final pathology results from surgery should be available around one week after surgery and the results will be relayed to you when available.  -Dr. Lahoma Crocker is the surgeon that assists your GYN Oncologist with surgery.  If you end up  staying the night, the next day after your surgery you will either see Dr. Denman George, Dr. Berline Lopes, or Dr. Lahoma Crocker.  -FMLA forms can be faxed to [phone] and please allow 5-7 business days for completion.  Pain Management After Surgery -You will be prescribed your pain medication and bowel regimen medications before surgery so that you can have these available when you are discharged from the hospital. The pain medication is for use ONLY AFTER surgery and a new prescription will not be given.   -Make sure that you have Tylenol and Ibuprofen at home to use on a regular basis after surgery for pain control. We recommend alternating the medications every hour to six hours since they work differently and are processed in the body differently for pain relief.  -Review the attached handout on narcotic use and their risks and side effects.   Bowel Regimen -You will be prescribed Sennakot-S to take nightly to prevent constipation especially if you are taking the narcotic pain medication intermittently.  It is important to prevent constipation and drink adequate amounts of liquids. You can stop taking this medication when you are not taking pain medication and you are back on your normal bowel routine.  Risks of Surgery Risks of surgery are low but include bleeding, infection, damage to surrounding structures, re-operation, blood clots, and very rarely death.   Blood Transfusion Information (For the consent to be signed before surgery)  We will be checking your blood type before surgery so in case of emergencies, we will know what type of blood you would need.                                            WHAT IS A BLOOD TRANSFUSION?  A transfusion is the replacement of blood or some of its parts. Blood is made up of multiple cells which provide different functions.  Red blood cells carry oxygen and are used for blood loss replacement.  White blood cells fight against  infection.  Platelets control bleeding.  Plasma helps clot blood.  Other blood products are available for specialized needs, such as hemophilia or other clotting disorders. BEFORE THE TRANSFUSION  Who gives blood for transfusions?   You may be able to donate blood to be used at a later date on yourself (autologous donation).  Relatives can be asked to donate blood. This is generally not any safer than if you have received blood from a stranger. The same precautions are taken to ensure safety when a relative's blood is donated.  Healthy volunteers who are fully evaluated to make sure their blood is safe. This is blood bank blood. Transfusion therapy is the safest it has ever been in the practice of medicine. Before blood is taken from a donor, a complete history is taken to make sure that person has no history of  diseases nor engages in risky social behavior (examples are intravenous drug use or sexual activity with multiple partners). The donor's travel history is screened to minimize risk of transmitting infections, such as malaria. The donated blood is tested for signs of infectious diseases, such as HIV and hepatitis. The blood is then tested to be sure it is compatible with you in order to minimize the chance of a transfusion reaction. If you or a relative donates blood, this is often done in anticipation of surgery and is not appropriate for emergency situations. It takes many days to process the donated blood. RISKS AND COMPLICATIONS Although transfusion therapy is very safe and saves many lives, the main dangers of transfusion include:   Getting an infectious disease.  Developing a transfusion reaction. This is an allergic reaction to something in the blood you were given. Every precaution is taken to prevent this. The decision to have a blood transfusion has been considered carefully by your caregiver before blood is given. Blood is not given unless the benefits outweigh the  risks.  AFTER SURGERY INSTRUCTIONS  Return to work: 4-6 weeks if applicable  Activity: 1. Be up and out of the bed during the day.  Take a nap if needed.  You may walk up steps but be careful and use the hand rail.  Stair climbing will tire you more than you think, you may need to stop part way and rest.   2. No lifting or straining for 6 weeks over 10 pounds. No pushing, pulling, straining for 6 weeks.  3. No driving for 1 week(s).  Do not drive if you are taking narcotic pain medicine and make sure that your reaction time has returned.   4. You can shower as soon as the next day after surgery. Shower daily.  Use soap and water on your incision and pat dry; don't rub.  No tub baths or submerging your body in water until cleared by your surgeon. If you have the soap that was given to you by pre-surgical testing that was used before surgery, you do not need to use it afterwards because this can irritate your incisions.   5. No sexual activity and nothing in the vagina for 8 weeks.  6. You may experience a small amount of clear drainage from your incisions, which is normal.  If the drainage persists, increases, or changes color please call the office.  7. Do not use creams, lotions, or ointments such as neosporin on your incisions after surgery until advised by your surgeon because they can cause removal of the dermabond glue on your incisions.    8. You may experience vaginal spotting after surgery or around the 6-8 week mark from surgery when the stitches at the top of the vagina begin to dissolve.  The spotting is normal but if you experience heavy bleeding, call our office.  9. Take Tylenol or ibuprofen first for pain and only use narcotic pain medication for severe pain not relieved by the Tylenol or Ibuprofen.  Monitor your Tylenol intake to a max of 4,000 mg in a 24 hour period. You can alternate these medications after surgery.  Diet: 1. Low sodium Heart Healthy Diet is  recommended.  2. It is safe to use a laxative, such as Miralax or Colace, if you have difficulty moving your bowels. You have been prescribed Sennakot at bedtime every evening to keep bowel movements regular and to prevent constipation.    Wound Care: 1. Keep clean and dry.  Shower daily.  Reasons to call the Doctor:  Fever - Oral temperature greater than 100.4 degrees Fahrenheit  Foul-smelling vaginal discharge  Difficulty urinating  Nausea and vomiting  Increased pain at the site of the incision that is unrelieved with pain medicine.  Difficulty breathing with or without chest pain  New calf pain especially if only on one side  Sudden, continuing increased vaginal bleeding with or without clots.   Contacts: For questions or concerns you should contact:  Dr. [name] at [phone]  Joylene John, NP at [phone]  After Hours: call [phone] and have the GYN Oncologist paged/contacted

## 2020-05-06 NOTE — Progress Notes (Signed)
Lower extremity venous RT study completed.  Preliminary results relayed to Denman George, MD.  See CV Proc for preliminary results report.   Darlin Coco, RDMS

## 2020-05-23 ENCOUNTER — Telehealth: Payer: Self-pay

## 2020-05-23 ENCOUNTER — Other Ambulatory Visit: Payer: Self-pay | Admitting: Gynecologic Oncology

## 2020-05-23 ENCOUNTER — Telehealth: Payer: Self-pay | Admitting: *Deleted

## 2020-05-23 DIAGNOSIS — C541 Malignant neoplasm of endometrium: Secondary | ICD-10-CM

## 2020-05-23 NOTE — Telephone Encounter (Signed)
TC to patient to follow up on endocrinologist appointment.  Patient stated she has seen Dr. Chalmers Cater recently and insulin was increased.  She is also using a Libre monitor.  Patient reports blood sugars in the "100s 72% of the time".  Ms. Whitehouse has an appointment for HgA1c tomorrow with Dr. Chalmers Cater.  Patient informed surgery date is moved to June 20, 2020 to allow more time to get blood sugars under control.  Patient disappointed but verbalized understanding.  Patient given gyn onc fax number in order to send tomorrow's bloodwork.  Patient will call with any questions or concerns.

## 2020-05-23 NOTE — Telephone Encounter (Signed)
Patient called and stated "Last time I saw Dr Denman George she wanted me to see my diabetics doctor to lower my lab numbers. I have been doing that. I thought I saw to have surgery 10/19 and labs before." Explained that the message will be forwarded to [facility] APP and someone will call her back later today.

## 2020-05-27 ENCOUNTER — Telehealth: Payer: Self-pay | Admitting: *Deleted

## 2020-05-27 NOTE — Telephone Encounter (Signed)
Received medical clearance for surgery. Fax a copy to preadmission, sent a copy to be scanned and a copy for the chart

## 2020-06-06 ENCOUNTER — Other Ambulatory Visit: Payer: Self-pay

## 2020-06-06 ENCOUNTER — Ambulatory Visit (HOSPITAL_COMMUNITY)
Admission: EM | Admit: 2020-06-06 | Discharge: 2020-06-06 | Disposition: A | Discharge status: Home / Self Care | Payer: BC Managed Care – PPO | Attending: Family Medicine | Admitting: Family Medicine

## 2020-06-06 ENCOUNTER — Encounter (HOSPITAL_COMMUNITY): Payer: Self-pay | Admitting: *Deleted

## 2020-06-06 ENCOUNTER — Ambulatory Visit (INDEPENDENT_AMBULATORY_CARE_PROVIDER_SITE_OTHER): Payer: BC Managed Care – PPO

## 2020-06-06 DIAGNOSIS — M79642 Pain in left hand: Secondary | ICD-10-CM

## 2020-06-06 DIAGNOSIS — M778 Other enthesopathies, not elsewhere classified: Secondary | ICD-10-CM

## 2020-06-06 DIAGNOSIS — M79645 Pain in left finger(s): Secondary | ICD-10-CM

## 2020-06-06 MED ORDER — TIZANIDINE HCL 2 MG PO CAPS: 2.0000 mg | ORAL_CAPSULE | Freq: Every evening | ORAL | 0 refills | Status: DC | PRN

## 2020-06-06 MED ORDER — MELOXICAM 7.5 MG PO TABS: 7.5000 mg | ORAL_TABLET | Freq: Every day | ORAL | 1 refills | Status: DC

## 2020-06-06 NOTE — ED Provider Notes (Signed)
[facility]    CSN: [id number] Arrival date & time: 06/06/20  1556      History   Chief Complaint Chief Complaint  Patient presents with  . Finger Injury    HPI Christy Carpenter is a 64 y.o. female.   HPI  Patient presents with left thumb pain and thumb joint clicking. No known injury. Patient occasionally radiates into the dorsum of left hand in the medial lateral region. Patient history significant for type 2 diabetes although denies any recurrent neuropathy symptoms. She has not taken any medication for pain.  Pain is causing difficulty with typing. PIP joint if the left thumb is becoming stuck and clicks.      Past Medical History:  Diagnosis Date  . Diabetes mellitus   . Hypercholesterolemia   . Hypertension   . Plantar fasciitis    Left foot    Patient Active Problem List   Diagnosis Date Noted  . Diabetic neuropathy ([place]) 02/23/2020  . Endometrial cancer ([place]) 02/23/2020  . Obesity (BMI 30.0-34.9) 02/23/2020  . Chest pain 08/19/2017  . Uncontrolled type 2 diabetes mellitus with hyperglycemia ([place]) 08/19/2017  . Essential hypertension 08/19/2017  . Hypercholesterolemia     Past Surgical History:  Procedure Laterality Date  . HAND LIGAMENT RECONSTRUCTION      OB History   No obstetric history on file.      Home Medications    Prior to Admission medications   Medication Sig Start Date End Date Taking? Authorizing Provider  amitriptyline (ELAVIL) 25 MG tablet Take 25 mg by mouth at bedtime.   Yes [provider]  atorvastatin (LIPITOR) 40 MG tablet Take 1 tablet (40 mg total) by mouth daily at 6 PM. 08/20/17  Yes Sheikh, Omair Latif, DO  fenofibrate micronized (LOFIBRA) 134 MG capsule Take 134 mg by mouth daily.   Yes [provider]  levothyroxine (SYNTHROID) 50 MCG tablet Take 50 mcg by mouth daily before breakfast.   Yes [provider]  losartan-hydrochlorothiazide (HYZAAR) 100-25 MG tablet Take 1 tablet by  mouth daily.   Yes [provider]  megestrol (MEGACE) 40 MG tablet Take 2 tablets (80 mg total) by mouth 2 (two) times daily. 02/26/20  Yes Cross, Melissa D, NP  NOVOLIN N FLEXPEN 100 UNIT/ML Kiwkpen Inject 75 Units into the skin 3 (three) times daily.  04/17/20  Yes [provider]  NOVOLIN R FLEXPEN 100 UNIT/ML SOPN Inject 75 Units into the skin 3 (three) times daily.  04/17/20  Yes [provider]  ONETOUCH ULTRA test strip 1 each 3 (three) times daily. 04/19/20  Yes [provider]  pregabalin (LYRICA) 150 MG capsule Take 150 mg by mouth 3 (three) times daily.    Yes [provider]  SitaGLIPtin-MetFORMIN HCl (JANUMET XR) 50-1000 MG TB24 Take 1 tablet by mouth 2 (two) times daily.   Yes [provider]  benzonatate (TESSALON) 100 MG capsule Take 1 capsule (100 mg total) by mouth every 8 (eight) hours. Patient not taking: Reported on 06/05/2020 04/06/20   Faustino Congress, NP  Fluticasone-Salmeterol (ADVAIR DISKUS) 250-50 MCG/DOSE AEPB Inhale 1 puff into the lungs in the morning and at bedtime. Patient not taking: Reported on 06/05/2020 04/06/20   Faustino Congress, NP    Family History Family History  Problem Relation Age of Onset  . Diabetes Mellitus II Mother   . Stroke Mother   . Hypertension Mother   . Stroke Brother   . Diabetes Mellitus II Brother   .  Hypertension Brother   . Cancer Paternal Aunt        ovarian    Social History Social History   Tobacco Use  . Smoking status: Never Smoker  . Smokeless tobacco: Never Used  Vaping Use  . Vaping Use: Never used  Substance Use Topics  . Alcohol use: No  . Drug use: No     Allergies   Byetta 10 mcg pen [exenatide], Januvia [sitagliptin], and Invokana [canagliflozin] Review of Systems Review of Systems Pertinent negatives listed in HPI  Physical Exam Triage Vital Signs ED Triage Vitals  Enc Vitals Group     BP --      Pulse --      Resp --      Temp --       Temp src --      SpO2 --      Weight 06/06/20 1719 184 lb (83.5 kg)     Height 06/06/20 1719 5' (1.524 m)     Head Circumference --      Peak Flow --      Pain Score 06/06/20 1718 6     Pain Loc --      Pain Edu? --      Excl. in [place]? --    No data found.  Updated Vital Signs Ht 5' (1.524 m)   Wt 184 lb (83.5 kg)   BMI 35.94 kg/m   Visual Acuity Right Eye Distance:   Left Eye Distance:   Bilateral Distance:    Right Eye Near:   Left Eye Near:    Bilateral Near:     Physical Exam General appearance: alert, well developed, well nourished, cooperative and in no distress Head: Normocephalic, without obvious abnormality, atraumatic Respiratory: Respirations even and unlabored, normal respiratory rate Heart: rate and rhythm normal. No gallop or murmurs noted on exam  Extremities: Left thumb, no swelling or deformity noted. Skin: Skin color, texture, turgor normal. No rashes seen  Psych: Appropriate mood and affect. UC Treatments / Results  Labs (all labs ordered are listed, but only abnormal results are displayed) Labs Reviewed - No data to display  EKG   Radiology DG Hand Complete Left  Result Date: 06/06/2020 CLINICAL DATA:  Thumb and distal palm pain EXAM: LEFT HAND - COMPLETE 3+ VIEW COMPARISON:  None. FINDINGS: There is no evidence of fracture or dislocation. There is no evidence of arthropathy or other focal bone abnormality. Soft tissues are unremarkable. IMPRESSION: Negative. Electronically Signed   By: Donavan Foil M.D.   On: 06/06/2020 18:00   Procedures Procedures (including critical care time)  Medications Ordered in UC Medications - No data to display  Initial Impression / Assessment and Plan / UC Course  I have reviewed the triage vital signs and the nursing notes.  Pertinent labs & imaging results that were available during my care of the patient were reviewed by me and considered in my medical decision making (see chart for details).     Thumb  imaging negative. Treating for acute tendinitis.  Recommend RICE and Meloxicam daily prn and tizanidine at bedtime for pain.  Recommend purchasing a thumb splint.  If no improvement follow-up with hand speciality. Patient verbalized understanding and agreement with plan Final Clinical Impressions(s) / UC Diagnoses   Final diagnoses:  Thumb tendonitis     Discharge Instructions     Purchase thumb splint. Keep thumb splint on daily until pain resolves.  Take Meloxicam 7.5 mg daily for inflammation until symptoms resolves  ED Prescriptions    Medication Sig Dispense Auth. Provider   tizanidine (ZANAFLEX) 2 MG capsule Take 1-2 capsules (2-4 mg total) by mouth at bedtime as needed and may repeat dose one time if needed for muscle spasms. 30 capsule Scot Jun, FNP   meloxicam (MOBIC) 7.5 MG tablet Take 1 tablet (7.5 mg total) by mouth daily. 30 tablet Scot Jun, FNP     PDMP not reviewed this encounter.   Scot Jun, [place] 06/10/20 [phone]

## 2020-06-06 NOTE — ED Triage Notes (Signed)
PT reports Lt thumb pain for 1.5 weeks with out injury. Pain is 6/10 . Pt can move finger unassisted.

## 2020-06-06 NOTE — Discharge Instructions (Addendum)
Purchase thumb splint. Keep thumb splint on daily until pain resolves.  Take Meloxicam 7.5 mg daily for inflammation until symptoms resolves

## 2020-06-10 NOTE — Patient Instructions (Addendum)
DUE TO COVID-19 ONLY ONE VISITOR IS ALLOWED TO COME WITH YOU AND STAY IN THE WAITING ROOM ONLY DURING PRE OP AND PROCEDURE DAY OF SURGERY. THE 1 VISITOR  MAY VISIT WITH YOU AFTER SURGERY IN YOUR PRIVATE ROOM DURING VISITING HOURS ONLY!  YOU NEED TO HAVE A COVID 19 TEST ON_11/1______ @_1 :15______, THIS TEST MUST BE DONE BEFORE SURGERY,  COVID TESTING SITE [place] [address], IT IS ON THE RIGHT GOING OUT [address] APPROXIMATELY  2 MINUTES PAST [employer] ON THE RIGHT. ONCE YOUR COVID TEST IS COMPLETED,  PLEASE BEGIN THE QUARANTINE INSTRUCTIONS AS OUTLINED IN YOUR HANDOUT.                Christy Carpenter   Your procedure is scheduled on: 06/20/20   Report to [facility] Main  Entrance   Report to Short Stay at 5:30  AM     Call this number if you have problems the morning of surgery [phone]    Remember: Do not eat food or drink liquids :After Midnight.   BRUSH YOUR TEETH MORNING OF SURGERY AND RINSE YOUR MOUTH OUT, NO CHEWING GUM CANDY OR MINTS.     Take these medicines the morning of surgery with A SIP OF WATER: Lyrica,Elivil, Megace, Levothyroxine.    DO NOT TAKE ANY DIABETIC MEDICATIONS DAY OF YOUR SURGERY      How to Manage Your Diabetes Before and After Surgery  Why is it important to control my blood sugar before and after surgery? . Improving blood sugar levels before and after surgery helps healing and can limit problems. . A way of improving blood sugar control is eating a healthy diet by: o  Eating less sugar and carbohydrates o  Increasing activity/exercise o  Talking with your doctor about reaching your blood sugar goals . High blood sugars (greater than 180 mg/dL) can raise your risk of infections and slow your recovery, so you will need to focus on controlling your diabetes during the weeks before surgery. . Make sure that the doctor who takes care of your diabetes knows about your planned surgery including the date and  location.  How do I manage my blood sugar before surgery? . Check your blood sugar at least 4 times a day, starting 2 days before surgery, to make sure that the level is not too high or low. o Check your blood sugar the morning of your surgery when you wake up and every 2 hours until you get to the Short Stay unit. . If your blood sugar is less than 70 mg/dL, you will need to treat for low blood sugar: o Do not take insulin. o Treat a low blood sugar (less than 70 mg/dL) with  cup of clear juice (cranberry or apple), 4 glucose tablets, OR glucose gel. o Recheck blood sugar in 15 minutes after treatment (to make sure it is greater than 70 mg/dL). If your blood sugar is not greater than 70 mg/dL on recheck, call [phone] for further instructions. . Report your blood sugar to the short stay nurse when you get to Short Stay.  . If you are admitted to the hospital after surgery: o Your blood sugar will be checked by the staff and you will probably be given insulin after surgery (instead of oral diabetes medicines) to make sure you have good blood sugar levels. o The goal for blood sugar control after surgery is 80-180 mg/dL.   WHAT DO I DO ABOUT  MY DIABETES MEDICATION?  Marland Kitchen Do not take oral diabetes medicines (pills) the morning of surgery.  . THE NIGHT BEFORE SURGERY, take 50% of usual dose 37  units of  Novolin N     insulin.       . THE MORNING OF SURGERY, take  37  units of Novolin N  insulin.  . The day of surgery, do not take other diabetes injectables, including Byetta (exenatide), Bydureon (exenatide ER), Victoza (liraglutide), or Trulicity (dulaglutide).  . If your CBG is greater than 220 mg/dL, you may take  of your sliding scale  . (correction) dose of insulin.                            You may not have any metal on your body including hair pins and              piercings  Do not wear jewelry, make-up, lotions, powders or perfumes, deodorant             Do not wear nail  polish on your fingernails.  Do not shave  48 hours prior to surgery.                Do not bring valuables to the hospital. [place].  Contacts, dentures or bridgework may not be worn into surgery.        Name and phone number of your driver:  Special Instructions: N/A              Please read over the following fact sheets you were given: _____________________________________________________________________             [facility] - Preparing for Surgery Before surgery, you can play an important role.   Because skin is not sterile, your skin needs to be as free of germs as possible.   You can reduce the number of germs on your skin by washing with CHG (chlorahexidine gluconate) soap before surgery.   CHG is an antiseptic cleaner which kills germs and bonds with the skin to continue killing germs even after washing. Please DO NOT use if you have an allergy to CHG or antibacterial soaps.   If your skin becomes reddened/irritated stop using the CHG and inform your nurse when you arrive at Short Stay. Do not shave (including legs and underarms) for at least 48 hours prior to the first CHG shower.  . Please follow these instructions carefully:  1.  Shower with CHG Soap the night before surgery and the  morning of Surgery.  2.  If you choose to wash your hair, wash your hair first as usual with your  normal  shampoo.  3.  After you shampoo, rinse your hair and body thoroughly to remove the  shampoo.                                        4.  Use CHG as you would any other liquid soap.  You can apply chg directly  to the skin and wash                       Gently with a scrungie or clean washcloth.  5.  Apply the CHG Soap  to your body ONLY FROM THE NECK DOWN.   Do not use on face/ open                           Wound or open sores. Avoid contact with eyes, ears mouth and genitals (private parts).                       Wash face,   Genitals (private parts) with your normal soap.             6.  Wash thoroughly, paying special attention to the area where your surgery  will be performed.  7.  Thoroughly rinse your body with warm water from the neck down.  8.  DO NOT shower/wash with your normal soap after using and rinsing off  the CHG Soap.             9.  Pat yourself dry with a clean towel.            10.  Wear clean pajamas.            11.  Place clean sheets on your bed the night of your first shower and do not  sleep with pets. Day of Surgery : Do not apply any lotions/deodorants the morning of surgery.  Please wear clean clothes to the hospital/surgery center.  FAILURE TO FOLLOW THESE INSTRUCTIONS MAY RESULT IN THE CANCELLATION OF YOUR SURGERY PATIENT SIGNATURE_________________________________  NURSE SIGNATURE__________________________________  ________________________________________________________________________   Adam Phenix  An incentive spirometer is a tool that can help keep your lungs clear and active. This tool measures how well you are filling your lungs with each breath. Taking long deep breaths may help reverse or decrease the chance of developing breathing (pulmonary) problems (especially infection) following:  A long period of time when you are unable to move or be active. BEFORE THE PROCEDURE   If the spirometer includes an indicator to show your best effort, your nurse or respiratory therapist will set it to a desired goal.  If possible, sit up straight or lean slightly forward. Try not to slouch.  Hold the incentive spirometer in an upright position. INSTRUCTIONS FOR USE  1. Sit on the edge of your bed if possible, or sit up as far as you can in bed or on a chair. 2. Hold the incentive spirometer in an upright position. 3. Breathe out normally. 4. Place the mouthpiece in your mouth and seal your lips tightly around it. 5. Breathe in slowly and as deeply as possible, raising the  piston or the ball toward the top of the column. 6. Hold your breath for 3-5 seconds or for as long as possible. Allow the piston or ball to fall to the bottom of the column. 7. Remove the mouthpiece from your mouth and breathe out normally. 8. Rest for a few seconds and repeat Steps 1 through 7 at least 10 times every 1-2 hours when you are awake. Take your time and take a few normal breaths between deep breaths. 9. The spirometer may include an indicator to show your best effort. Use the indicator as a goal to work toward during each repetition. 10. After each set of 10 deep breaths, practice coughing to be sure your lungs are clear. If you have an incision (the cut made at the time of surgery), support your incision when coughing by placing a pillow or rolled up towels firmly against it.  Once you are able to get out of bed, walk around indoors and cough well. You may stop using the incentive spirometer when instructed by your caregiver.  RISKS AND COMPLICATIONS  Take your time so you do not get dizzy or light-headed.  If you are in pain, you may need to take or ask for pain medication before doing incentive spirometry. It is harder to take a deep breath if you are having pain. AFTER USE  Rest and breathe slowly and easily.  It can be helpful to keep track of a log of your progress. Your caregiver can provide you with a simple table to help with this. If you are using the spirometer at home, follow these instructions: [place] IF:   You are having difficultly using the spirometer.  You have trouble using the spirometer as often as instructed.  Your pain medication is not giving enough relief while using the spirometer.  You develop fever of 100.5 F (38.1 C) or higher. SEEK IMMEDIATE MEDICAL CARE IF:   You cough up bloody sputum that had not been present before.  You develop fever of 102 F (38.9 C) or greater.  You develop worsening pain at or near the incision  site. MAKE SURE YOU:   Understand these instructions.  Will watch your condition.  Will get help right away if you are not doing well or get worse. Document Released: 12/14/2006 Document Revised: 10/26/2011 Document Reviewed: 02/14/2007 [facility] Patient Information 2014 [place], [place].   ________________________________________________________________________

## 2020-06-11 ENCOUNTER — Encounter (HOSPITAL_COMMUNITY)
Admission: RE | Admit: 2020-06-11 | Discharge: 2020-06-11 | Disposition: A | Discharge status: Home / Self Care | Payer: BC Managed Care – PPO | Source: Ambulatory Visit | Attending: Gynecologic Oncology | Admitting: Gynecologic Oncology

## 2020-06-11 ENCOUNTER — Encounter (HOSPITAL_COMMUNITY): Payer: Self-pay

## 2020-06-11 ENCOUNTER — Other Ambulatory Visit: Payer: Self-pay

## 2020-06-11 DIAGNOSIS — Z01818 Encounter for other preprocedural examination: Secondary | ICD-10-CM | POA: Diagnosis not present

## 2020-06-11 DIAGNOSIS — E119 Type 2 diabetes mellitus without complications: Secondary | ICD-10-CM | POA: Diagnosis not present

## 2020-06-11 HISTORY — DX: Unspecified mononeuropathy of bilateral lower limbs: G57.93

## 2020-06-11 HISTORY — DX: Malignant (primary) neoplasm, unspecified: C80.1

## 2020-06-11 HISTORY — DX: Gastro-esophageal reflux disease without esophagitis: K21.9

## 2020-06-11 LAB — CBC
HCT: 35.3 % — ABNORMAL LOW (ref 36.0–46.0)
Hemoglobin: 12.1 g/dL (ref 12.0–15.0)
MCH: 30.4 pg (ref 26.0–34.0)
MCHC: 34.3 g/dL (ref 30.0–36.0)
MCV: 88.7 fL (ref 80.0–100.0)
Platelets: 233 10*3/uL (ref 150–400)
RBC: 3.98 MIL/uL (ref 3.87–5.11)
RDW: 13.3 % (ref 11.5–15.5)
WBC: 7.1 10*3/uL (ref 4.0–10.5)
nRBC: 0 % (ref 0.0–0.2)

## 2020-06-11 LAB — COMPREHENSIVE METABOLIC PANEL
ALT: 23 U/L (ref 0–44)
AST: 22 U/L (ref 15–41)
Albumin: 3.9 g/dL (ref 3.5–5.0)
Alkaline Phosphatase: 43 U/L (ref 38–126)
Anion gap: 12 (ref 5–15)
BUN: 22 mg/dL (ref 8–23)
CO2: 21 mmol/L — ABNORMAL LOW (ref 22–32)
Calcium: 9.5 mg/dL (ref 8.9–10.3)
Chloride: 106 mmol/L (ref 98–111)
Creatinine, Ser: 1.11 mg/dL — ABNORMAL HIGH (ref 0.44–1.00)
GFR, Estimated: 56 mL/min — ABNORMAL LOW (ref >60)
Glucose, Bld: 210 mg/dL — ABNORMAL HIGH (ref 70–99)
Potassium: 3.9 mmol/L (ref 3.5–5.1)
Sodium: 139 mmol/L (ref 135–145)
Total Bilirubin: 0.4 mg/dL (ref 0.3–1.2)
Total Protein: 7.5 g/dL (ref 6.5–8.1)

## 2020-06-11 LAB — GLUCOSE, CAPILLARY: Glucose-Capillary: 131 mg/dL — ABNORMAL HIGH (ref 70–99)

## 2020-06-11 NOTE — Progress Notes (Signed)
COVID Vaccine Completed:Yes Date COVID Vaccine completed:10/27/19 COVID vaccine manufacturer:   [employer]   PCP - Dr. Tanna Savoy Cardiologist - no  Chest x-ray - 04/06/20-Epic EKG - 06/11/20-Chart  Stress Test - no ECHO - 1/4/19_epic Cardiac Cath - no Pacemaker/ICD device last checked:NA  Sleep Study - no CPAP -   Fasting Blood Sugar - 80-130 Checks Blood Sugar _____ times a day. continuous  Blood Thinner Instructions:NA Aspirin Instructions: Last Dose:  Anesthesia review:   Patient denies shortness of breath, fever, cough and chest pain at PAT appointment Yes   Patient verbalized understanding of instructions that were given to them at the PAT appointment. Patient was also instructed that they will need to review over the PAT instructions again at home before surgery. Yes  Pt takes insulin and has been able to keep her CBGs in a good range. She had been in the 500s. She has had a 20 lb wt gain and gets SOB climbing stairs but not doing housework or ADLs.

## 2020-06-12 LAB — HEMOGLOBIN A1C
Hgb A1c MFr Bld: 8.2 % — ABNORMAL HIGH (ref 4.8–5.6)
Mean Plasma Glucose: 189 mg/dL

## 2020-06-13 NOTE — Progress Notes (Signed)
Anesthesia Chart Review:   Case: [id number] Date/Time: 06/20/20 0715   Procedures:      XI ROBOTIC ASSISTED TOTAL HYSTERECTOMY WITH BILATERAL SALPINGO OOPHORECTOMY (N/A )     SENTINEL NODE BIOPSY (N/A )   Anesthesia type: General   Pre-op diagnosis: ENDOMETRIAL CANCER   Location: [facility] ROOM 02 / [facility]   Surgeons: [name], MD      DISCUSSION:  Pt is a 64 year old with hx HTN, DM  VS: BP (!) 141/67   Pulse 100   Temp 36.7 C (Oral)   Resp 18   Ht 5' (1.524 m)   Wt 87.6 kg   SpO2 99%   BMI 37.73 kg/m   PROVIDERS: - PCP is [name], MD - Endocrinologist is Jacelyn Pi, MD who has cleared pt for surgery from a diabetes standpoint   LABS: Labs reviewed: Acceptable for surgery.  - HbA1c 8.2 (improved from A1c of 11.1 on 05/02/20) - Glucose 210  (all labs ordered are listed, but only abnormal results are displayed)  Labs Reviewed  GLUCOSE, CAPILLARY - Abnormal; Notable for the following components:      Result Value   Glucose-Capillary 131 (*)    All other components within normal limits  HEMOGLOBIN A1C - Abnormal; Notable for the following components:   Hgb A1c MFr Bld 8.2 (*)    All other components within normal limits  CBC - Abnormal; Notable for the following components:   HCT 35.3 (*)    All other components within normal limits  COMPREHENSIVE METABOLIC PANEL - Abnormal; Notable for the following components:   CO2 21 (*)    Glucose, Bld 210 (*)    Creatinine, Ser 1.11 (*)    GFR, Estimated 56 (*)    All other components within normal limits  TYPE AND SCREEN     IMAGES: CXR 04/06/20: No active cardiopulmonary disease.   EKG 06/11/20: NSR. Possible Anterior infarct, age undetermined. Appears stable compared to prior EKG 08/18/17   CV: Echo 08/20/17:  - Left ventricle: The cavity size was normal. Wall thickness was normal. Systolic function was normal. The estimated ejection fraction was in the range of 60% to 65%. Wall motion was normal; there were  no regional wall motion abnormalities. Doppler parameters are consistent with abnormal left ventricular relaxation (grade 1 diastolic dysfunction).  - Aortic valve: Trileaflet; mildly thickened, mildly calcified leaflets.    Past Medical History:  Diagnosis Date  . Cancer ([place])    endometreal ut  . Diabetes mellitus   . GERD (gastroesophageal reflux disease)   . Hypercholesterolemia   . Hypertension   . Neuropathy of both feet   . Plantar fasciitis    Left foot    Past Surgical History:  Procedure Laterality Date  . HAND LIGAMENT RECONSTRUCTION      MEDICATIONS: . amitriptyline (ELAVIL) 25 MG tablet  . atorvastatin (LIPITOR) 40 MG tablet  . benzonatate (TESSALON) 100 MG capsule  . fenofibrate micronized (LOFIBRA) 134 MG capsule  . Fluticasone-Salmeterol (ADVAIR DISKUS) 250-50 MCG/DOSE AEPB  . levothyroxine (SYNTHROID) 50 MCG tablet  . losartan-hydrochlorothiazide (HYZAAR) 100-25 MG tablet  . megestrol (MEGACE) 40 MG tablet  . meloxicam (MOBIC) 7.5 MG tablet  . NOVOLIN N FLEXPEN 100 UNIT/ML Kiwkpen  . NOVOLIN R FLEXPEN 100 UNIT/ML SOPN  . ONETOUCH ULTRA test strip  . pregabalin (LYRICA) 150 MG capsule  . SitaGLIPtin-MetFORMIN HCl (JANUMET XR) 50-1000 MG TB24  . tizanidine (ZANAFLEX) 2 MG capsule   No current facility-administered medications  for this encounter.    If glucose acceptable day of surgery, I anticipate pt can proceed with surgery as scheduled.  Willeen Cass, PhD, FNP-BC [facility] Short Stay Surgical [facility]/Anesthesiology Phone: [phone] 06/13/2020 3:34 PM

## 2020-06-13 NOTE — Anesthesia Preprocedure Evaluation (Addendum)
Anesthesia Evaluation  Patient identified by MRN, date of birth, ID band Patient awake    Reviewed: Allergy & Precautions, NPO status , Patient's Chart, lab work & pertinent test results  Airway Mallampati: III  TM Distance: >3 FB Neck ROM: Full   Comment: Very short neck Dental no notable dental hx. (+) Dental Advisory Given, Chipped,    Pulmonary neg pulmonary ROS,    Pulmonary exam normal breath sounds clear to auscultation       Cardiovascular hypertension, Pt. on medications +CHF (grade 1 diastolic dysfunction)  Normal cardiovascular exam Rhythm:Regular Rate:Normal  Echo 08/20/17:  - Left ventricle: The cavity size was normal. Wall thickness was normal. Systolic function was normal. The estimated ejection fraction was in the range of 60% to 65%. Wall motion was normal; there were no regional wall motion abnormalities. Doppler parameters are consistent with abnormal left ventricular relaxation (grade 1 diastolic dysfunction).  - Aortic valve: Trileaflet; mildly thickened, mildly calcified leaflets.    Neuro/Psych negative neurological ROS  negative psych ROS   GI/Hepatic Neg liver ROS, GERD  Medicated and Controlled,  Endo/Other  diabetes, Well Controlled, Type 2, Oral Hypoglycemic AgentsHypothyroidism Obesity BMI 38 a1c 8.2 FS 104 this AM, feels hypoglycemic around 50s  Renal/GU negative Renal ROS   Endometrial cancer    Musculoskeletal Peripheral neuropathy b/l feet   Abdominal (+) + obese,   Peds  Hematology negative hematology ROS (+) hct 35.3, plt 233   Anesthesia Other Findings   Reproductive/Obstetrics negative OB ROS                          Anesthesia Physical Anesthesia Plan  ASA: III  Anesthesia Plan: General   Post-op Pain Management:    Induction: Intravenous  PONV Risk Score and Plan: 4 or greater and Ondansetron, Dexamethasone, Midazolam, Treatment may vary due to age  or medical condition and Scopolamine patch - Pre-op  Airway Management Planned: Oral ETT and Video Laryngoscope Planned  Additional Equipment: None  Intra-op Plan:   Post-operative Plan: Extubation in OR  Informed Consent: I have reviewed the patients History and Physical, chart, labs and discussed the procedure including the risks, benefits and alternatives for the proposed anesthesia with the patient or authorized representative who has indicated his/her understanding and acceptance.     Dental advisory given  Plan Discussed with: CRNA  Anesthesia Plan Comments:       Anesthesia Quick Evaluation

## 2020-06-17 ENCOUNTER — Other Ambulatory Visit (HOSPITAL_COMMUNITY)
Admission: RE | Admit: 2020-06-17 | Discharge: 2020-06-17 | Disposition: A | Discharge status: Home / Self Care | Payer: BC Managed Care – PPO | Source: Ambulatory Visit | Attending: Gynecologic Oncology | Admitting: Gynecologic Oncology

## 2020-06-17 DIAGNOSIS — Z20822 Contact with and (suspected) exposure to covid-19: Secondary | ICD-10-CM | POA: Diagnosis not present

## 2020-06-17 DIAGNOSIS — Z01818 Encounter for other preprocedural examination: Secondary | ICD-10-CM | POA: Insufficient documentation

## 2020-06-18 LAB — SARS CORONAVIRUS 2 (TAT 6-24 HRS): SARS Coronavirus 2: NEGATIVE

## 2020-06-19 ENCOUNTER — Telehealth: Payer: Self-pay

## 2020-06-19 NOTE — Telephone Encounter (Signed)
Reviewed written pre op instructions with Ms Suttles. Told her that her pain medications and stool softeners would be prescribed prior to discharge on Friday and can be picked up on the way home per Melissa Cross,NP. Ms Bieri did not know if she needed to take her Novolin R 75 units this evening or take half the dose. Reached out to [employer] with presurgical testing. She said that Ms Calvo should do 50%, 37 units, of the Novilin N this evening and the same DOS. For the Novilin R she takes her normal nose before her meal tonight and then does not take it in the AM. Gave these instructions to Ms Lanpher.  She verbalized understanding. Told Ms Mandile that she can also have clear liquids = water after midnight until 0430 tomorrow morning.

## 2020-06-19 NOTE — Progress Notes (Signed)
I explained to the Pt how to manage her insulin prior to DOS. She was told not to take Novolin R DOS. Her Novolin R is not used as a sliding scale so I told her to call her PCP. herPre op instructions were provided. Today I reviewed the instructions with Dr. Serita Grit RN. We felt that she should not take Novolin R on DOS or at bedtime but she should call her PCP. Konrad Felix and I reviewed the insulin protocol together.

## 2020-06-20 ENCOUNTER — Other Ambulatory Visit: Payer: Self-pay

## 2020-06-20 ENCOUNTER — Observation Stay (HOSPITAL_COMMUNITY)
Admission: RE | Admit: 2020-06-20 | Discharge: 2020-06-21 | Disposition: A | Discharge status: Home / Self Care | Payer: BC Managed Care – PPO | Attending: Gynecologic Oncology | Admitting: Gynecologic Oncology

## 2020-06-20 ENCOUNTER — Encounter (HOSPITAL_COMMUNITY): Payer: Self-pay | Admitting: Gynecologic Oncology

## 2020-06-20 ENCOUNTER — Ambulatory Visit (HOSPITAL_COMMUNITY): Payer: BC Managed Care – PPO | Admitting: Emergency Medicine

## 2020-06-20 ENCOUNTER — Ambulatory Visit (HOSPITAL_COMMUNITY): Payer: BC Managed Care – PPO | Admitting: Anesthesiology

## 2020-06-20 ENCOUNTER — Encounter (HOSPITAL_COMMUNITY)
Admission: RE | Disposition: A | Discharge status: Home / Self Care | Payer: Self-pay | Source: Home / Self Care | Attending: Gynecologic Oncology

## 2020-06-20 DIAGNOSIS — Z7984 Long term (current) use of oral hypoglycemic drugs: Secondary | ICD-10-CM | POA: Insufficient documentation

## 2020-06-20 DIAGNOSIS — C541 Malignant neoplasm of endometrium: Principal | ICD-10-CM | POA: Diagnosis present

## 2020-06-20 DIAGNOSIS — I10 Essential (primary) hypertension: Secondary | ICD-10-CM | POA: Insufficient documentation

## 2020-06-20 DIAGNOSIS — N135 Crossing vessel and stricture of ureter without hydronephrosis: Secondary | ICD-10-CM | POA: Diagnosis not present

## 2020-06-20 DIAGNOSIS — Z79899 Other long term (current) drug therapy: Secondary | ICD-10-CM | POA: Insufficient documentation

## 2020-06-20 DIAGNOSIS — E669 Obesity, unspecified: Secondary | ICD-10-CM | POA: Diagnosis present

## 2020-06-20 DIAGNOSIS — E1165 Type 2 diabetes mellitus with hyperglycemia: Secondary | ICD-10-CM | POA: Diagnosis present

## 2020-06-20 DIAGNOSIS — K682 Retroperitoneal fibrosis: Secondary | ICD-10-CM

## 2020-06-20 DIAGNOSIS — N809 Endometriosis, unspecified: Secondary | ICD-10-CM

## 2020-06-20 DIAGNOSIS — E66811 Obesity, class 1: Secondary | ICD-10-CM | POA: Diagnosis present

## 2020-06-20 HISTORY — PX: ROBOTIC ASSISTED TOTAL HYSTERECTOMY WITH BILATERAL SALPINGO OOPHERECTOMY: SHX6086

## 2020-06-20 HISTORY — PX: SENTINEL NODE BIOPSY: SHX6608

## 2020-06-20 LAB — TYPE AND SCREEN
ABO/RH(D): AB NEG
Antibody Screen: NEGATIVE

## 2020-06-20 LAB — GLUCOSE, CAPILLARY
Glucose-Capillary: 104 mg/dL — ABNORMAL HIGH (ref 70–99)
Glucose-Capillary: 94 mg/dL (ref 70–99)

## 2020-06-20 LAB — ABO/RH: ABO/RH(D): AB NEG

## 2020-06-20 SURGERY — HYSTERECTOMY, TOTAL, ROBOT-ASSISTED, LAPAROSCOPIC, WITH BILATERAL SALPINGO-OOPHORECTOMY
Anesthesia: General | Site: Abdomen

## 2020-06-20 MED ORDER — BUPIVACAINE HCL 0.25 % IJ SOLN
INTRAMUSCULAR | Status: AC
  Filled 2020-06-20: qty 1

## 2020-06-20 MED ORDER — HYDROMORPHONE HCL 1 MG/ML IJ SOLN
0.5000 mg | INTRAMUSCULAR | Status: DC | PRN
  Administered 2020-06-20: 22:00:00 0.5 mg via INTRAVENOUS
  Filled 2020-06-20: qty 0.5

## 2020-06-20 MED ORDER — ONDANSETRON HCL 4 MG/2ML IJ SOLN
INTRAMUSCULAR | Status: AC
  Filled 2020-06-20: qty 2

## 2020-06-20 MED ORDER — ROCURONIUM BROMIDE 100 MG/10ML IV SOLN
INTRAVENOUS | Status: DC | PRN
  Administered 2020-06-20: 100 mg via INTRAVENOUS
  Administered 2020-06-20: 20 mg via INTRAVENOUS

## 2020-06-20 MED ORDER — ACETAMINOPHEN 500 MG PO TABS
1000.0000 mg | ORAL_TABLET | Freq: Once | ORAL | Status: AC
  Filled 2020-06-20: qty 2

## 2020-06-20 MED ORDER — OXYCODONE HCL 5 MG PO TABS
5.0000 mg | ORAL_TABLET | ORAL | Status: DC | PRN
  Administered 2020-06-21: 5 mg via ORAL
  Filled 2020-06-20: qty 1

## 2020-06-20 MED ORDER — LIDOCAINE HCL (CARDIAC) PF 100 MG/5ML IV SOSY
PREFILLED_SYRINGE | INTRAVENOUS | Status: DC | PRN
  Administered 2020-06-20: 60 mg via INTRAVENOUS

## 2020-06-20 MED ORDER — SCOPOLAMINE 1 MG/3DAYS TD PT72
1.0000 | MEDICATED_PATCH | TRANSDERMAL | Status: DC
  Administered 2020-06-20: 1.5 mg via TRANSDERMAL

## 2020-06-20 MED ORDER — DAPAGLIFLOZIN PROPANEDIOL 10 MG PO TABS
10.0000 mg | ORAL_TABLET | Freq: Every day | ORAL | Status: DC
  Administered 2020-06-21: 10 mg via ORAL
  Filled 2020-06-20: qty 1

## 2020-06-20 MED ORDER — ENOXAPARIN SODIUM 40 MG/0.4ML ~~LOC~~ SOLN
40.0000 mg | SUBCUTANEOUS | Status: DC
  Administered 2020-06-21: 10:00:00 40 mg via SUBCUTANEOUS
  Filled 2020-06-20: qty 0.4

## 2020-06-20 MED ORDER — DEXAMETHASONE SODIUM PHOSPHATE 10 MG/ML IJ SOLN
INTRAMUSCULAR | Status: DC | PRN
  Administered 2020-06-20: 4 mg via INTRAVENOUS

## 2020-06-20 MED ORDER — ONDANSETRON HCL 4 MG/2ML IJ SOLN
INTRAMUSCULAR | Status: DC | PRN
  Administered 2020-06-20: 4 mg via INTRAVENOUS

## 2020-06-20 MED ORDER — OXYCODONE HCL 5 MG PO TABS: 5.0000 mg | ORAL_TABLET | Freq: Once | ORAL | Status: DC | PRN

## 2020-06-20 MED ORDER — ROCURONIUM BROMIDE 10 MG/ML (PF) SYRINGE
PREFILLED_SYRINGE | INTRAVENOUS | Status: AC
  Filled 2020-06-20: qty 10

## 2020-06-20 MED ORDER — ONDANSETRON HCL 4 MG/2ML IJ SOLN: 4.0000 mg | Freq: Four times a day (QID) | INTRAMUSCULAR | Status: DC | PRN

## 2020-06-20 MED ORDER — INSULIN NPH (HUMAN) (ISOPHANE) 100 UNIT/ML ~~LOC~~ SUSP
75.0000 [IU] | Freq: Three times a day (TID) | SUBCUTANEOUS | Status: DC
  Administered 2020-06-20: 17:00:00 75 [IU] via SUBCUTANEOUS
  Filled 2020-06-20 (×2): qty 10

## 2020-06-20 MED ORDER — AMITRIPTYLINE HCL 25 MG PO TABS
25.0000 mg | ORAL_TABLET | Freq: Every day | ORAL | Status: DC
  Filled 2020-06-20: qty 1

## 2020-06-20 MED ORDER — SITAGLIP PHOS-METFORMIN HCL ER 50-1000 MG PO TB24: 1.0000 | ORAL_TABLET | Freq: Two times a day (BID) | ORAL | Status: DC

## 2020-06-20 MED ORDER — PROMETHAZINE HCL 25 MG/ML IJ SOLN: 6.2500 mg | INTRAMUSCULAR | Status: DC | PRN

## 2020-06-20 MED ORDER — LACTATED RINGERS IV SOLN: INTRAVENOUS | Status: DC

## 2020-06-20 MED ORDER — SUGAMMADEX SODIUM 200 MG/2ML IV SOLN
INTRAVENOUS | Status: DC | PRN
  Administered 2020-06-20 (×2): 100 mg via INTRAVENOUS
  Administered 2020-06-20: 300 mg via INTRAVENOUS

## 2020-06-20 MED ORDER — STERILE WATER FOR INJECTION IJ SOLN
INTRAMUSCULAR | Status: DC | PRN
  Administered 2020-06-20: 1 mL via SURGICAL_CAVITY

## 2020-06-20 MED ORDER — CHLORHEXIDINE GLUCONATE 0.12 % MT SOLN
15.0000 mL | Freq: Once | OROMUCOSAL | Status: AC
  Administered 2020-06-20: 15 mL via OROMUCOSAL

## 2020-06-20 MED ORDER — OXYCODONE HCL 5 MG/5ML PO SOLN: 5.0000 mg | Freq: Once | ORAL | Status: DC | PRN

## 2020-06-20 MED ORDER — INSULIN ASPART 100 UNIT/ML ~~LOC~~ SOLN
0.0000 [IU] | Freq: Three times a day (TID) | SUBCUTANEOUS | Status: DC
  Administered 2020-06-20: 17:00:00 7 [IU] via SUBCUTANEOUS

## 2020-06-20 MED ORDER — PHENYLEPHRINE HCL-NACL 10-0.9 MG/250ML-% IV SOLN
INTRAVENOUS | Status: DC | PRN
  Administered 2020-06-20: 60 ug/min via INTRAVENOUS

## 2020-06-20 MED ORDER — PREGABALIN 75 MG PO CAPS
150.0000 mg | ORAL_CAPSULE | Freq: Three times a day (TID) | ORAL | Status: DC
  Administered 2020-06-20 – 2020-06-21 (×3): 150 mg via ORAL
  Filled 2020-06-20 (×3): qty 2

## 2020-06-20 MED ORDER — SCOPOLAMINE 1 MG/3DAYS TD PT72
1.0000 | MEDICATED_PATCH | TRANSDERMAL | Status: DC
  Filled 2020-06-20: qty 1

## 2020-06-20 MED ORDER — ACETAMINOPHEN 500 MG PO TABS
1000.0000 mg | ORAL_TABLET | Freq: Two times a day (BID) | ORAL | Status: DC
  Administered 2020-06-20 – 2020-06-21 (×2): 1000 mg via ORAL
  Filled 2020-06-20 (×2): qty 2

## 2020-06-20 MED ORDER — PHENYLEPHRINE HCL (PRESSORS) 10 MG/ML IV SOLN
INTRAVENOUS | Status: AC
  Filled 2020-06-20: qty 1

## 2020-06-20 MED ORDER — LACTATED RINGERS IR SOLN
Status: DC | PRN
  Administered 2020-06-20: 1000 mL

## 2020-06-20 MED ORDER — SODIUM CHLORIDE 0.9% FLUSH: 3.0000 mL | Freq: Two times a day (BID) | INTRAVENOUS | Status: DC

## 2020-06-20 MED ORDER — ACETAMINOPHEN 500 MG PO TABS
1000.0000 mg | ORAL_TABLET | ORAL | Status: AC
  Administered 2020-06-20: 1000 mg via ORAL

## 2020-06-20 MED ORDER — BUPIVACAINE HCL 0.25 % IJ SOLN
INTRAMUSCULAR | Status: DC | PRN
  Administered 2020-06-20: 20 mL

## 2020-06-20 MED ORDER — DEXAMETHASONE SODIUM PHOSPHATE 10 MG/ML IJ SOLN
INTRAMUSCULAR | Status: AC
  Filled 2020-06-20: qty 1

## 2020-06-20 MED ORDER — LACTATED RINGERS IV SOLN: INTRAVENOUS | Status: DC | PRN

## 2020-06-20 MED ORDER — HYDROMORPHONE HCL 2 MG/ML IJ SOLN
INTRAMUSCULAR | Status: AC
  Filled 2020-06-20: qty 1

## 2020-06-20 MED ORDER — KETAMINE HCL 10 MG/ML IJ SOLN
INTRAMUSCULAR | Status: AC
  Filled 2020-06-20: qty 1

## 2020-06-20 MED ORDER — LINAGLIPTIN 5 MG PO TABS
5.0000 mg | ORAL_TABLET | Freq: Every day | ORAL | Status: DC
  Filled 2020-06-20: qty 1

## 2020-06-20 MED ORDER — PROPOFOL 10 MG/ML IV BOLUS
INTRAVENOUS | Status: AC
  Filled 2020-06-20: qty 20

## 2020-06-20 MED ORDER — FENOFIBRATE 160 MG PO TABS
160.0000 mg | ORAL_TABLET | Freq: Every day | ORAL | Status: DC
  Administered 2020-06-21: 10:00:00 160 mg via ORAL
  Filled 2020-06-20: qty 1

## 2020-06-20 MED ORDER — LEVOTHYROXINE SODIUM 50 MCG PO TABS
50.0000 ug | ORAL_TABLET | Freq: Every day | ORAL | Status: DC
  Administered 2020-06-21: 50 ug via ORAL
  Filled 2020-06-20: qty 1

## 2020-06-20 MED ORDER — FENTANYL CITRATE (PF) 100 MCG/2ML IJ SOLN
INTRAMUSCULAR | Status: AC
  Filled 2020-06-20: qty 2

## 2020-06-20 MED ORDER — PROPOFOL 10 MG/ML IV BOLUS
INTRAVENOUS | Status: DC | PRN
  Administered 2020-06-20: 200 mg via INTRAVENOUS

## 2020-06-20 MED ORDER — MIDAZOLAM HCL 2 MG/2ML IJ SOLN
INTRAMUSCULAR | Status: AC
  Filled 2020-06-20: qty 2

## 2020-06-20 MED ORDER — IBUPROFEN 400 MG PO TABS
600.0000 mg | ORAL_TABLET | Freq: Four times a day (QID) | ORAL | Status: DC
  Administered 2020-06-21: 10:00:00 600 mg via ORAL
  Filled 2020-06-20: qty 1

## 2020-06-20 MED ORDER — FENTANYL CITRATE (PF) 250 MCG/5ML IJ SOLN
INTRAMUSCULAR | Status: AC
  Filled 2020-06-20: qty 5

## 2020-06-20 MED ORDER — STERILE WATER FOR INJECTION IJ SOLN
INTRAMUSCULAR | Status: DC | PRN
  Administered 2020-06-20: 10 mL

## 2020-06-20 MED ORDER — KCL IN DEXTROSE-NACL 20-5-0.45 MEQ/L-%-% IV SOLN
INTRAVENOUS | Status: DC
  Filled 2020-06-20 (×3): qty 1000

## 2020-06-20 MED ORDER — MIDAZOLAM HCL 5 MG/5ML IJ SOLN
INTRAMUSCULAR | Status: DC | PRN
  Administered 2020-06-20 (×2): 1 mg via INTRAVENOUS

## 2020-06-20 MED ORDER — STERILE WATER FOR INJECTION IJ SOLN
INTRAMUSCULAR | Status: AC
  Filled 2020-06-20: qty 10

## 2020-06-20 MED ORDER — PHENYLEPHRINE 40 MCG/ML (10ML) SYRINGE FOR IV PUSH (FOR BLOOD PRESSURE SUPPORT)
PREFILLED_SYRINGE | INTRAVENOUS | Status: AC
  Filled 2020-06-20: qty 10

## 2020-06-20 MED ORDER — FENTANYL CITRATE (PF) 100 MCG/2ML IJ SOLN
INTRAMUSCULAR | Status: DC | PRN
  Administered 2020-06-20: 25 ug via INTRAVENOUS
  Administered 2020-06-20: 50 ug via INTRAVENOUS
  Administered 2020-06-20: 100 ug via INTRAVENOUS
  Administered 2020-06-20: 50 ug via INTRAVENOUS
  Administered 2020-06-20: 25 ug via INTRAVENOUS

## 2020-06-20 MED ORDER — KETAMINE HCL 10 MG/ML IJ SOLN
INTRAMUSCULAR | Status: DC | PRN
  Administered 2020-06-20: 25 mg via INTRAVENOUS

## 2020-06-20 MED ORDER — CEFAZOLIN SODIUM-DEXTROSE 2-4 GM/100ML-% IV SOLN
2.0000 g | INTRAVENOUS | Status: AC
  Administered 2020-06-20: 2 g via INTRAVENOUS
  Filled 2020-06-20: qty 100

## 2020-06-20 MED ORDER — LIDOCAINE 2% (20 MG/ML) 5 ML SYRINGE
INTRAMUSCULAR | Status: AC
  Filled 2020-06-20: qty 5

## 2020-06-20 MED ORDER — ONDANSETRON HCL 4 MG PO TABS: 4.0000 mg | ORAL_TABLET | Freq: Four times a day (QID) | ORAL | Status: DC | PRN

## 2020-06-20 MED ORDER — GABAPENTIN 300 MG PO CAPS
300.0000 mg | ORAL_CAPSULE | ORAL | Status: AC
  Administered 2020-06-20: 300 mg via ORAL
  Filled 2020-06-20: qty 1

## 2020-06-20 MED ORDER — ATORVASTATIN CALCIUM 40 MG PO TABS
40.0000 mg | ORAL_TABLET | Freq: Every day | ORAL | Status: DC
  Administered 2020-06-20: 40 mg via ORAL
  Filled 2020-06-20: qty 1

## 2020-06-20 MED ORDER — STERILE WATER FOR IRRIGATION IR SOLN
Status: DC | PRN
  Administered 2020-06-20: 1000 mL

## 2020-06-20 MED ORDER — ORAL CARE MOUTH RINSE: 15.0000 mL | Freq: Once | OROMUCOSAL | Status: AC

## 2020-06-20 MED ORDER — METFORMIN HCL ER 500 MG PO TB24
1000.0000 mg | ORAL_TABLET | Freq: Two times a day (BID) | ORAL | Status: DC
  Filled 2020-06-20: qty 2

## 2020-06-20 MED ORDER — ENOXAPARIN SODIUM 40 MG/0.4ML ~~LOC~~ SOLN
40.0000 mg | SUBCUTANEOUS | Status: AC
  Administered 2020-06-20: 40 mg via SUBCUTANEOUS
  Filled 2020-06-20: qty 0.4

## 2020-06-20 MED ORDER — INSULIN ASPART 100 UNIT/ML ~~LOC~~ SOLN
0.0000 [IU] | Freq: Every day | SUBCUTANEOUS | Status: DC
  Administered 2020-06-20: 3 [IU] via SUBCUTANEOUS

## 2020-06-20 MED ORDER — HYDROMORPHONE HCL 1 MG/ML IJ SOLN: 0.2500 mg | INTRAMUSCULAR | Status: DC | PRN

## 2020-06-20 MED ORDER — PHENYLEPHRINE HCL (PRESSORS) 10 MG/ML IV SOLN
INTRAVENOUS | Status: DC | PRN
  Administered 2020-06-20: 120 ug via INTRAVENOUS
  Administered 2020-06-20: 100 ug via INTRAVENOUS
  Administered 2020-06-20: 80 ug via INTRAVENOUS

## 2020-06-20 MED ORDER — SENNOSIDES-DOCUSATE SODIUM 8.6-50 MG PO TABS
2.0000 | ORAL_TABLET | Freq: Every day | ORAL | Status: DC
  Administered 2020-06-20: 2 via ORAL
  Filled 2020-06-20: qty 2

## 2020-06-20 MED ORDER — TRAMADOL HCL 50 MG PO TABS
100.0000 mg | ORAL_TABLET | Freq: Two times a day (BID) | ORAL | Status: DC | PRN
  Administered 2020-06-20: 100 mg via ORAL
  Filled 2020-06-20: qty 2

## 2020-06-20 SURGICAL SUPPLY — 68 items
ADH SKN CLS APL DERMABOND .7 (GAUZE/BANDAGES/DRESSINGS) ×1
AGENT HMST KT MTR STRL THRMB (HEMOSTASIS)
APL ESCP 34 STRL LF DISP (HEMOSTASIS)
APPLICATOR SURGIFLO ENDO (HEMOSTASIS) IMPLANT
BACTOSHIELD CHG 4% 4OZ (MISCELLANEOUS) ×1
BAG LAPAROSCOPIC 12 15 PORT 16 (BASKET) IMPLANT
BAG RETRIEVAL 12/15 (BASKET)
BAG SPEC RTRVL LRG 6X4 10 (ENDOMECHANICALS)
BLADE SURG SZ10 CARB STEEL (BLADE) IMPLANT
COVER BACK TABLE 60X90IN (DRAPES) ×2 IMPLANT
COVER TIP SHEARS 8 DVNC (MISCELLANEOUS) ×1 IMPLANT
COVER TIP SHEARS 8MM DA VINCI (MISCELLANEOUS) ×2
COVER WAND RF STERILE (DRAPES) IMPLANT
DECANTER SPIKE VIAL GLASS SM (MISCELLANEOUS) ×2 IMPLANT
DERMABOND ADVANCED (GAUZE/BANDAGES/DRESSINGS) ×1
DERMABOND ADVANCED .7 DNX12 (GAUZE/BANDAGES/DRESSINGS) ×1 IMPLANT
DRAPE ARM DVNC X/XI (DISPOSABLE) ×4 IMPLANT
DRAPE COLUMN DVNC XI (DISPOSABLE) ×1 IMPLANT
DRAPE DA VINCI XI ARM (DISPOSABLE) ×8
DRAPE DA VINCI XI COLUMN (DISPOSABLE) ×2
DRAPE SHEET LG 3/4 BI-LAMINATE (DRAPES) ×2 IMPLANT
DRAPE SURG IRRIG POUCH 19X23 (DRAPES) ×2 IMPLANT
DRSG OPSITE POSTOP 4X6 (GAUZE/BANDAGES/DRESSINGS) IMPLANT
DRSG OPSITE POSTOP 4X8 (GAUZE/BANDAGES/DRESSINGS) IMPLANT
ELECT PENCIL ROCKER SW 15FT (MISCELLANEOUS) IMPLANT
ELECT REM PT RETURN 15FT ADLT (MISCELLANEOUS) ×2 IMPLANT
GLOVE BIO SURGEON STRL SZ 6 (GLOVE) ×8 IMPLANT
GLOVE BIO SURGEON STRL SZ 6.5 (GLOVE) ×4 IMPLANT
GOWN STRL REUS W/ TWL LRG LVL3 (GOWN DISPOSABLE) ×4 IMPLANT
GOWN STRL REUS W/TWL LRG LVL3 (GOWN DISPOSABLE) ×8
HOLDER FOLEY CATH W/STRAP (MISCELLANEOUS) ×2 IMPLANT
IRRIG SUCT STRYKERFLOW 2 WTIP (MISCELLANEOUS) ×2
IRRIGATION SUCT STRKRFLW 2 WTP (MISCELLANEOUS) ×1 IMPLANT
KIT PROCEDURE DA VINCI SI (MISCELLANEOUS) ×2
KIT PROCEDURE DVNC SI (MISCELLANEOUS) ×1 IMPLANT
KIT TURNOVER KIT A (KITS) IMPLANT
MANIPULATOR UTERINE 4.5 ZUMI (MISCELLANEOUS) ×2 IMPLANT
NEEDLE HYPO 22GX1.5 SAFETY (NEEDLE) ×2 IMPLANT
NEEDLE SPNL 18GX3.5 QUINCKE PK (NEEDLE) ×2 IMPLANT
OBTURATOR OPTICAL STANDARD 8MM (TROCAR) ×2
OBTURATOR OPTICAL STND 8 DVNC (TROCAR) ×1
OBTURATOR OPTICALSTD 8 DVNC (TROCAR) ×1 IMPLANT
PACK ROBOT GYN CUSTOM WL (TRAY / TRAY PROCEDURE) ×2 IMPLANT
PAD POSITIONING PINK XL (MISCELLANEOUS) ×2 IMPLANT
PORT ACCESS TROCAR AIRSEAL 12 (TROCAR) ×1 IMPLANT
PORT ACCESS TROCAR AIRSEAL 5M (TROCAR) ×1
POUCH SPECIMEN RETRIEVAL 10MM (ENDOMECHANICALS) IMPLANT
SCRUB CHG 4% DYNA-HEX 4OZ (MISCELLANEOUS) ×1 IMPLANT
SEAL CANN UNIV 5-8 DVNC XI (MISCELLANEOUS) ×3 IMPLANT
SEAL XI 5MM-8MM UNIVERSAL (MISCELLANEOUS) ×6
SET TRI-LUMEN FLTR TB AIRSEAL (TUBING) ×2 IMPLANT
SPONGE LAP 18X18 RF (DISPOSABLE) IMPLANT
SURGIFLO W/THROMBIN 8M KIT (HEMOSTASIS) IMPLANT
SUT MNCRL AB 4-0 PS2 18 (SUTURE) IMPLANT
SUT PDS AB 1 TP1 96 (SUTURE) IMPLANT
SUT VIC AB 0 CT1 27 (SUTURE)
SUT VIC AB 0 CT1 27XBRD ANTBC (SUTURE) IMPLANT
SUT VIC AB 2-0 CT1 27 (SUTURE)
SUT VIC AB 2-0 CT1 TAPERPNT 27 (SUTURE) IMPLANT
SUT VIC AB 4-0 PS2 18 (SUTURE) ×4 IMPLANT
SYR 10ML LL (SYRINGE) ×2 IMPLANT
SYR 20ML LL LF (SYRINGE) IMPLANT
TOWEL OR NON WOVEN STRL DISP B (DISPOSABLE) ×2 IMPLANT
TRAP SPECIMEN MUCUS 40CC (MISCELLANEOUS) IMPLANT
TRAY FOLEY MTR SLVR 16FR STAT (SET/KITS/TRAYS/PACK) ×2 IMPLANT
TROCAR XCEL NON-BLD 5MMX100MML (ENDOMECHANICALS) IMPLANT
UNDERPAD 30X36 HEAVY ABSORB (UNDERPADS AND DIAPERS) ×2 IMPLANT
WATER STERILE IRR 1000ML POUR (IV SOLUTION) ×2 IMPLANT

## 2020-06-20 NOTE — Anesthesia Procedure Notes (Signed)
Procedure Name: Intubation Date/Time: 06/20/2020 7:37 AM Performed by: Garrel Ridgel, CRNA Pre-anesthesia Checklist: Patient identified, Emergency Drugs available, Suction available and Patient being monitored Patient Re-evaluated:Patient Re-evaluated prior to induction Oxygen Delivery Method: Circle system utilized Preoxygenation: Pre-oxygenation with 100% oxygen Induction Type: IV induction Ventilation: Mask ventilation without difficulty Laryngoscope Size: Mac and 3 Grade View: Grade III Tube type: Oral Tube size: 7.0 mm Number of attempts: 1 Airway Equipment and Method: Stylet and Oral airway Placement Confirmation: ETT inserted through vocal cords under direct vision,  positive ETCO2 and breath sounds checked- equal and bilateral Secured at: 21 cm Tube secured with: Tape Dental Injury: Teeth and Oropharynx as per pre-operative assessment

## 2020-06-20 NOTE — Op Note (Signed)
OPERATIVE NOTE 06/20/20  Surgeon: Donaciano Eva   Assistants: Dr Lahoma Crocker (an MD assistant was necessary for tissue manipulation, management of robotic instrumentation, retraction and positioning due to the complexity of the case and hospital policies).   Anesthesia: General endotracheal anesthesia  ASA Class: 3   Pre-operative Diagnosis: endometrial cancer grade 1, morbid obesity, poorly controlled diabetes mellitus  Post-operative Diagnosis: same,   Operation: Robotic-assisted laparoscopic total hysterectomy with bilateral salpingoophorectomy, SLN biopsy, right ureterolysis  Surgeon: Donaciano Eva  Assistant Surgeon: Lahoma Crocker MD  Anesthesia: GET  Urine Output: 550cc  Operative Findings:  : Extreme visceral adiposity making visualization of pelvic structures limited. Stage IV endometriosis with chocolate right ovarian cysts densely adherent to the uterosacral ligament and rectovaginal septum with adherence to the posterior cervix. Right ureter densely invested in left retroperitoneal fibrosis from endometriosis reaction which required left ureterolysis to free the ureter from the dissection plane. No extrauterine disease. 10cm uterus.   Estimated Blood Loss:  15cc      Total IV Fluids: 800 ml         Specimens: uterus, cervix, bilateral tubes and ovaries, right external iliac SLN, left external iliac SLN, left obturator SLN.         Complications:  None; patient tolerated the procedure well.         Disposition: PACU - hemodynamically stable.  Procedure Details  The patient was seen in the Holding Room. The risks, benefits, complications, treatment options, and expected outcomes were discussed with the patient.  The patient concurred with the proposed plan, giving informed consent.  The site of surgery properly noted/marked. The patient was identified as Christy Carpenter and the procedure verified as a Robotic-assisted hysterectomy with bilateral  salpingo oophorectomy with SLN biopsy. A Time Out was held and the above information confirmed.  After induction of anesthesia, the patient was draped and prepped in the usual sterile manner. Pt was placed in supine position after anesthesia and draped and prepped in the usual sterile manner. The abdominal drape was placed after the CholoraPrep had been allowed to dry for 3 minutes.  Her arms were tucked to her side with all appropriate precautions.  The shoulders were stabilized with padded shoulder blocks applied to the acromium processes.  The patient was placed in the semi-lithotomy position in [place].  The perineum was prepped with Betadine. The patient was then prepped. Foley catheter was placed.  A sterile speculum was placed in the vagina.  The cervix was grasped with a single-tooth tenaculum. 2mg  total of ICG was injected into the cervical stroma at 2 and 9 o'clock with 1cc injected at a 1cm and 37mm depth (concentration 0.5mg /ml) in all locations. The cervix was dilated with Kennon Rounds dilators.  The ZUMI uterine manipulator with a medium colpotomizer ring was placed without difficulty.  A pneum occluder balloon was placed over the manipulator.  OG tube placement was confirmed and to suction.   Next, a 5 mm skin incision was made 1 cm below the subcostal margin in the midclavicular line.  The 5 mm Optiview port and scope was used for direct entry.  Opening pressure was under 10 mm CO2.  The abdomen was insufflated and the findings were noted as above.   At this point and all points during the procedure, the patient's intra-abdominal pressure did not exceed 15 mmHg. Next, a 10 mm skin incision was made in the umbilicus and a right and left port was placed about 10 cm lateral  to the robot port on the right and left side.  A fourth arm was placed in the left lower quadrant 2 cm above and superior and medial to the anterior superior iliac spine.  All ports were placed under direct visualization.  The  patient was placed in steep Trendelenburg.  Bowel was folded away into the upper abdomen.  The robot was docked in the normal manner.  The right and left peritoneum were opened parallel to the IP ligament to open the retroperitoneal spaces bilaterally. The SLN mapping was performed in bilateral pelvic basins. The para rectal and paravesical spaces were opened up entirely with careful dissection below the level of the ureters bilaterally and to the depth of the uterine artery origin in order to skeletonize the uterine "web" and ensure visualization of all parametrial channels. The para-aortic basins were carefully exposed and evaluated for isolated para-aortic SLN's. Lymphatic channels were identified travelling to the following visualized sentinel lymph node's: right and left external iliac, left obturator SLN. These SLN's were separated from their surrounding lymphatic tissue, removed and sent for permanent pathology.  The hysterectomy was started after the round ligament on the right side was incised and the retroperitoneum was entered and the pararectal space was developed.  The ureter was noted to be on the medial leaf of the broad ligament.  The peritoneum above the ureter was incised and stretched and the infundibulopelvic ligament was skeletonized, cauterized and cut.  For 20 minutes sharp adhesilysis and right ureterolysis was performed in the right retroperitoneum to separate the ureter from its investments in retroperitoneal fibrosis that adhered it to the right uterosacral and ovary.  After the ureter had been separated, the posterior peritoneum was taken down to the level of the KOH ring and the rectovaginal septum was developed sharply to drop the adhesions between the rectum and the posterior uterus.  The anterior peritoneum was also taken down.  The bladder flap was created to the level of the KOH ring.  The uterine artery on the right side was skeletonized, cauterized and cut in the normal  manner.  A similar procedure was performed on the left.  The colpotomy was made and the uterus, cervix, bilateral ovaries and tubes were amputated and delivered through the vagina.  Pedicles were inspected and excellent hemostasis was achieved.    The colpotomy at the vaginal cuff was closed with Vicryl on a CT1 needle in a running manner.  Irrigation was used and excellent hemostasis was achieved.  At this point in the procedure was completed.  Robotic instruments were removed under direct visulaization.  The robot was undocked. The 10 mm ports were closed with Vicryl on a UR-5 needle and the fascia was closed with 0 Vicryl on a UR-5 needle.  The skin was closed with 4-0 Vicryl in a subcuticular manner.  Dermabond was applied.  Sponge, lap and needle counts correct x 2.  The patient was taken to the recovery room in stable condition.  The vagina was swabbed with  minimal bleeding noted.   All instrument and needle counts were correct x  3.   The patient was transferred to the recovery room in a stable condition.  Donaciano Eva, MD

## 2020-06-20 NOTE — H&P (Signed)
H&P  Note: Gyn-Onc  Consult was requested by Dr. Benjie Karvonen for the evaluation of Christy Carpenter 64 y.o. female  CC:  Grade 1 endometrial cancer, obesity, diabetes (poorly controlled)  Assessment/Plan:  Christy Carpenter  is a 64 y.o.  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 Surgery delayed due to poor blood sugar control.  She appears to have somewhat better control with continuous blood glucose monitoring. She is no longer spiking at levels >600.   Surgery will consist of a robotic assisted total hysterectomy, BSO, SLN biopsy.  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  The patient reported a 1 year history of postmenopausal bleeding. She attributed this bleeding to having a retained IUD. She did not have an OBGYN provider and so was not being evaluated. She reported only seeing her endocrinologist for surveillance of her diabetes on a regular basis. She has a PCP, however does not see that provider at regularly scheduled visits. She brought attention to the symptom of postmenopausal spotting to her endocrinologist on 01/19/20 who recommended OBGYN work up.  The patient saw Dr Benjie Karvonen on January 29, 2020 and a Pap smear was performed which was cytologically normal and negative for high-risk HPV.  Additionally she underwent ultrasound evaluation of the endometrium at that date which revealed a uterus of normal size, the endometrium is heterogeneous and thickened with vascularity and measured 27.3 mm.  A Lippe's Loop IUD was noted.  It was within the endometrial cavity.  There were normal ovaries and no adnexal masses.  She declined endometrial biopsy on that same day and returned at a separate visit on February 14, 2020 for endometrial sampling with a Pipelle biopsy.  This revealed FIGO grade 1 endometrioid  adenocarcinoma.  The patient reported a past history of type 2 diabetes mellitus for which she takes insulin.  She is managed by an endocrinologist for this whom she sees regularly.  Her last documented HbA1c was 13% in January 2021.  At that time she was regularly having fasting blood glucoses in the 400-500 range.  Alterations were made in her insulin regimen and the patient began observing fasting blood glucoses in the 200s.  She rarely has blood glucoses less than 200 even in the fasting timeframe.  The patient's diabetes mellitus is complicated by peripheral neuropathy, particular in her feet.  She has some renal dysfunction though is unclear of the exact degree or nature of this.  She denies retinopathy or infectious complications.  The patient is obese with a BMI of 34 kg meters squared.  She has hypertension hypercholesterolemia.  Interval Hx:  Her surgery was initially delayed due to very poor diabetes control (HbAc1 of 10.7%). She was prescribed megace which facilitated increased food consumption due to appetite stimulation.  Her HbA1c was rechecked in September and had increased to 11.1%. However, her endocrinologist prescribed a freestyle libre continuous blood glucose monitoring system and with this she has stopped having spikes in blood glucose that were too high to read (>600). According to the patient, these were happening regularly before September, 2021.  She has no further bleeding on Megace.  Repeat HbA1c on 06/11/20 was improved at 8.2%.  Current Meds:  Outpatient Encounter Medications as of 05/06/2020  Medication Sig  . amitriptyline (ELAVIL) 25 MG tablet Take 25 mg by  mouth at bedtime.  Marland Kitchen atorvastatin (LIPITOR) 40 MG tablet Take 1 tablet (40 mg total) by mouth daily at 6 PM.  . benzonatate (TESSALON) 100 MG capsule Take 1 capsule (100 mg total) by mouth every 8 (eight) hours. (Patient taking differently: Take 100 mg by mouth 3 (three) times daily as needed. )  .  Fluticasone-Salmeterol (ADVAIR DISKUS) 250-50 MCG/DOSE AEPB Inhale 1 puff into the lungs in the morning and at bedtime.  Marland Kitchen losartan-hydrochlorothiazide (HYZAAR) 100-25 MG tablet Take 1 tablet by mouth daily.  . megestrol (MEGACE) 40 MG tablet Take 2 tablets (80 mg total) by mouth 2 (two) times daily.  . metFORMIN (GLUCOPHAGE-XR) 500 MG 24 hr tablet Take 500 mg by mouth 2 (two) times daily.   Marland Kitchen NOVOLIN N FLEXPEN 100 UNIT/ML Kiwkpen Inject 75 Units into the skin in the morning, at noon, and at bedtime.   Marland Kitchen NOVOLIN R FLEXPEN 100 UNIT/ML SOPN Inject 75 Units into the skin in the morning, at noon, and at bedtime.   Glory Rosebush ULTRA test strip 1 each 3 (three) times daily.  . pregabalin (LYRICA) 150 MG capsule Take 150 mg by mouth 3 (three) times daily.   . insulin aspart protamine- aspart (NOVOLOG MIX 70/30) (70-30) 100 UNIT/ML injection Inject 50 Units into the skin in the morning, at noon, and at bedtime. (Patient not taking: Reported on 05/06/2020)  . NOVOLIN 70/30 FLEXPEN (70-30) 100 UNIT/ML KwikPen SMARTSIG:50 Unit(s) SUB-Q 3 Times Daily (Patient not taking: Reported on 05/06/2020)  . [DISCONTINUED] aspirin EC 81 MG EC tablet Take 1 tablet (81 mg total) by mouth daily.  . [DISCONTINUED] SITagliptin-metFORMIN HCl (JANUMET PO) Take by mouth.   No facility-administered encounter medications on file as of 05/06/2020.    Allergy:  Allergies  Allergen Reactions  . Byetta 10 Mcg Pen [Exenatide] Itching  . Januvia [Sitagliptin]     Yeast infections   . Invokana [Canagliflozin] Other (See Comments)    Yeast infections    Social Hx:   Social History   Socioeconomic History  . Marital status: Married    Spouse name: Not on file  . Number of children: Not on file  . Years of education: Not on file  . Highest education level: Not on file  Occupational History  . Not on file  Tobacco Use  . Smoking status: Never Smoker  . Smokeless tobacco: Never Used  Vaping Use  . Vaping Use: Never used   Substance and Sexual Activity  . Alcohol use: No  . Drug use: No  . Sexual activity: Not on file  Other Topics Concern  . Not on file  Social History Narrative   Lives home with spouse.  Education Masters Degree.  Works at [employer].  2 Children.   Caffeine 1 cup coffee daily.    Social Determinants of Health   Financial Resource Strain:   . Difficulty of Paying Living Expenses: Not on file  Food Insecurity:   . Worried About Charity fundraiser in the Last Year: Not on file  . Ran Out of Food in the Last Year: Not on file  Transportation Needs:   . Lack of Transportation (Medical): Not on file  . Lack of Transportation (Non-Medical): Not on file  Physical Activity:   . Days of Exercise per Week: Not on file  . Minutes of Exercise per Session: Not on file  Stress:   . Feeling of Stress : Not on file  Social Connections:   . Frequency of Communication  with Friends and Family: Not on file  . Frequency of Social Gatherings with Friends and Family: Not on file  . Attends Religious Services: Not on file  . Active Member of Clubs or Organizations: Not on file  . Attends Archivist Meetings: Not on file  . Marital Status: Not on file  Intimate Partner Violence:   . Fear of Current or Ex-Partner: Not on file  . Emotionally Abused: Not on file  . Physically Abused: Not on file  . Sexually Abused: Not on file    Past Surgical Hx:  Past Surgical History:  Procedure Laterality Date  . HAND LIGAMENT RECONSTRUCTION      Past Medical Hx:  Past Medical History:  Diagnosis Date  . Cancer ([place])    endometreal ut  . Diabetes mellitus   . GERD (gastroesophageal reflux disease)   . Hypercholesterolemia   . Hypertension   . Neuropathy of both feet   . Plantar fasciitis    Left foot    Past Gynecological History:  See HPI No LMP recorded. Patient is postmenopausal.  Family Hx:  Family History  Problem Relation Age of Onset  . Diabetes Mellitus II Mother   . Stroke  Mother   . Hypertension Mother   . Stroke Brother   . Diabetes Mellitus II Brother   . Hypertension Brother   . Cancer Paternal Aunt        ovarian    Review of Systems:  Constitutional  Feels well,    ENT Normal appearing ears and nares bilaterally Skin/Breast  No rash, sores, jaundice, itching, dryness Cardiovascular  No chest pain, shortness of breath, or edema  Pulmonary  No cough or wheeze.  Gastro Intestinal  No nausea, vomitting, or diarrhoea. No bright red blood per rectum, no abdominal pain, change in bowel movement, or constipation.  Genito Urinary  No frequency, urgency, dysuria, no bleeding Musculo Skeletal  No myalgia, arthralgia, joint swelling or pain  Neurologic  No weakness, numbness, change in gait,  Psychology  No depression, anxiety, insomnia.   Vitals:  Blood pressure (!) 141/83, pulse 98, temperature 98.3 F (36.8 C), temperature source Oral, resp. rate 16, height 5' (1.524 m), weight 193 lb 3 oz (87.6 kg), SpO2 97 %.  Physical Exam: WD in NAD Neck  Supple NROM, without any enlargements.  Lymph Node Survey No cervical supraclavicular or inguinal adenopathy Cardiovascular  Pulse normal rate, regularity and rhythm. S1 and S2 normal.  Lungs  Clear to auscultation bilateraly, without wheezes/crackles/rhonchi. Good air movement.  Skin  No rash/lesions/breakdown  Psychiatry  Alert and oriented to person, place, and time  Abdomen  Normoactive bowel sounds, abdomen soft, non-tender and obese without evidence of hernia. Back No CVA tenderness Genito Urinary  Vulva/vagina: Normal external female genitalia.  No lesions. No discharge or bleeding.  Bladder/urethra:  No lesions or masses, well supported bladder  Vagina: smooth, normal, no lesions  Cervix: Normal appearing, no lesions. No IUD strings  Uterus: Small, bulky LUS, mobile, no parametrial involvement or nodularity.  Adnexa: no palpable masses. Rectal  deferred.  Extremities  No  bilateral cyanosis, clubbing or edema. Right posterior thigh - no redness, heat, edema. 1cm palpable nodular area above hamstrings tendon (medial).    Thereasa Solo, MD  06/20/2020, 7:13 AM

## 2020-06-20 NOTE — Anesthesia Postprocedure Evaluation (Signed)
Anesthesia Post Note  Patient: Christy Carpenter  Procedure(s) Performed: XI ROBOTIC ASSISTED TOTAL HYSTERECTOMY WITH BILATERAL SALPINGO OOPHORECTOMY, RIGHT URETERAL LYSIS (N/A Abdomen) SENTINEL NODE BIOPSY (N/A Abdomen)     Patient location during evaluation: PACU Anesthesia Type: General Level of consciousness: awake and alert, oriented and patient cooperative Pain management: pain level controlled Vital Signs Assessment: post-procedure vital signs reviewed and stable Respiratory status: spontaneous breathing, nonlabored ventilation and respiratory function stable Cardiovascular status: blood pressure returned to baseline and stable Postop Assessment: no apparent nausea or vomiting Anesthetic complications: no   No complications documented.  Last Vitals:  Vitals:   06/20/20 1100 06/20/20 1141  BP: 131/85 132/81  Pulse: 79 92  Resp: 14 18  Temp: 36.6 C (!) 36.4 C  SpO2: 100% 94%    Last Pain:  Vitals:   06/20/20 1141  TempSrc: Oral  PainSc:                  Pervis Hocking

## 2020-06-20 NOTE — Transfer of Care (Signed)
Immediate Anesthesia Transfer of Care Note  Patient: Christy Carpenter  Procedure(s) Performed: XI ROBOTIC ASSISTED TOTAL HYSTERECTOMY WITH BILATERAL SALPINGO OOPHORECTOMY, RIGHT URETERAL LYSIS (N/A Abdomen) SENTINEL NODE BIOPSY (N/A Abdomen)  Patient Location: PACU  Anesthesia Type:General  Level of Consciousness: oriented, drowsy and patient cooperative  Airway & Oxygen Therapy: Patient Spontanous Breathing and Patient connected to face mask oxygen  Post-op Assessment: Report given to RN and Post -op Vital signs reviewed and stable  Post vital signs: Reviewed and stable  Last Vitals:  Vitals Value Taken Time  BP 125/75 06/20/20 1000  Temp 36.6 C 06/20/20 0958  Pulse 88 06/20/20 1003  Resp 15 06/20/20 1003  SpO2 100 % 06/20/20 1003  Vitals shown include unvalidated device data.  Last Pain:  Vitals:   06/20/20 0624  TempSrc: Oral  PainSc:       Patients Stated Pain Goal: 4 (61/60/73 7106)  Complications: No complications documented.

## 2020-06-21 ENCOUNTER — Encounter: Payer: Self-pay | Admitting: *Deleted

## 2020-06-21 ENCOUNTER — Encounter (HOSPITAL_COMMUNITY): Payer: Self-pay | Admitting: Gynecologic Oncology

## 2020-06-21 LAB — CBC
HCT: 30.9 % — ABNORMAL LOW (ref 36.0–46.0)
Hemoglobin: 10.5 g/dL — ABNORMAL LOW (ref 12.0–15.0)
MCH: 30.3 pg (ref 26.0–34.0)
MCHC: 34 g/dL (ref 30.0–36.0)
MCV: 89.3 fL (ref 80.0–100.0)
Platelets: 199 10*3/uL (ref 150–400)
RBC: 3.46 MIL/uL — ABNORMAL LOW (ref 3.87–5.11)
RDW: 13.6 % (ref 11.5–15.5)
WBC: 9.4 10*3/uL (ref 4.0–10.5)
nRBC: 0 % (ref 0.0–0.2)

## 2020-06-21 LAB — BASIC METABOLIC PANEL
Anion gap: 11 (ref 5–15)
BUN: 18 mg/dL (ref 8–23)
CO2: 21 mmol/L — ABNORMAL LOW (ref 22–32)
Calcium: 8.6 mg/dL — ABNORMAL LOW (ref 8.9–10.3)
Chloride: 103 mmol/L (ref 98–111)
Creatinine, Ser: 1.04 mg/dL — ABNORMAL HIGH (ref 0.44–1.00)
GFR, Estimated: >60 mL/min (ref >60)
Glucose, Bld: 191 mg/dL — ABNORMAL HIGH (ref 70–99)
Potassium: 4 mmol/L (ref 3.5–5.1)
Sodium: 135 mmol/L (ref 135–145)

## 2020-06-21 MED ORDER — SENNOSIDES-DOCUSATE SODIUM 8.6-50 MG PO TABS: 2.0000 | ORAL_TABLET | Freq: Every day | ORAL | 0 refills | Status: DC

## 2020-06-21 MED ORDER — INSULIN ASPART 100 UNIT/ML ~~LOC~~ SOLN
75.0000 [IU] | Freq: Three times a day (TID) | SUBCUTANEOUS | Status: DC
  Administered 2020-06-21: 10:00:00 75 [IU] via SUBCUTANEOUS

## 2020-06-21 MED ORDER — OXYCODONE HCL 5 MG PO TABS: 5.0000 mg | ORAL_TABLET | Freq: Four times a day (QID) | ORAL | 0 refills | Status: DC | PRN

## 2020-06-21 MED ORDER — INSULIN REGULAR HUMAN 100 UNIT/ML IJ SOPN: 75.0000 [IU] | PEN_INJECTOR | Freq: Three times a day (TID) | INTRAMUSCULAR | Status: DC

## 2020-06-21 NOTE — Discharge Instructions (Signed)
Return to work: 4 weeks (2 weeks with physical restrictions).  Continue to monitor your blood sugar and contact Dr. Almetta Lovely office for any concerns.  Activity: 1. Be up and out of the bed during the day.  Take a nap if needed.  You may walk up steps but be careful and use the hand rail.  Stair climbing will tire you more than you think, you may need to stop part way and rest.   2. No lifting or straining for 4-6 weeks.  3. No driving for 1 weeks.  Do Not drive if you are taking narcotic pain medicine.  4. Shower daily.  Use soap and water on your incision and pat dry; don't rub.   5. No sexual activity and nothing in the vagina for 8 weeks.  Medications:  - Take ibuprofen (use sparingly given slightly elevated kidney function) and tylenol first line for pain control. Take these regularly (every 6 hours) to decrease the build up of pain.  - If necessary, for severe pain not relieved by tylenol or ibuprofen, contact Dr Serita Grit office and you will be prescribed oxycodone.  - While taking oxycodone, you should take sennakot every night to reduce the likelihood of constipation. If this causes diarrhea, stop its use.  Diet: 1. Low sodium Heart Healthy Diet is recommended.  2. It is safe to use a laxative if you have difficulty moving your bowels.   Wound Care: 1. Keep clean and dry.  Shower daily.  Reasons to call the Doctor:   Fever - Oral temperature greater than 100.4 degrees Fahrenheit  Foul-smelling vaginal discharge  Difficulty urinating  Nausea and vomiting  Increased pain at the site of the incision that is unrelieved with pain medicine.  Difficulty breathing with or without chest pain  New calf pain especially if only on one side  Sudden, continuing increased vaginal bleeding with or without clots.   Follow-up: 1. See [name] in 4 weeks.  Contacts: For questions or concerns you should contact:  Dr. [name] at [phone] After hours and on week-ends  call [phone] and ask to speak to the physician on call for Gynecologic Oncology  After Your Surgery  The information in this section will tell you what to expect after your surgery, both during your stay and after you leave. You will learn how to safely recover from your surgery. Write down any questions you have and be sure to ask your doctor or nurse.  What to Expect When you wake up after your surgery, you will be in the [facility] (P[facility]U) or your recovery room. A nurse will be monitoring your body temperature, blood pressure, pulse, and oxygen levels. You may have a urinary catheter in your bladder to help monitor the amount of urine you are making. It should come out before you go home. You will also have compression boots on your lower legs to help your circulation. Your pain medication will be given through an IV line or in tablet form. If you are having pain, tell your nurse. Your nurse will tell you how to recover from your surgery. Below are examples of ways you can help yourself recover safely. . You will be encouraged to walk with the help of your nurse or physical therapist. We will give you medication to relieve pain. Walking helps reduce the risk for blood clots and pneumonia. It also helps to stimulate your bowels so they begin working again. . Use your incentive spirometer. This will  help your lungs expand, which prevents pneumonia.   Commonly Asked Questions  Will I have pain after surgery? Yes, you will have some pain after your surgery, especially in the first few days. Your doctor and nurse will ask you about your pain often. You will be given medication to manage your pain as needed. If your pain is not relieved, please tell your doctor or nurse. It is important to control your pain so you can cough, breathe deeply, use your incentive spirometer, and get out of bed and walk.  Will I be able to eat? Yes, you will be able to eat a regular diet or eat as  tolerated. You should start with foods that are soft and easy to digest such as apple sauce and chicken noodle soup. Eat small meals frequently, and then advance to regular foods. If you experience bloating, gas, or cramps, limit high-fiber foods, including whole grain breads and cereal, nuts, seeds, salads, fresh fruit, broccoli, cabbage, and cauliflower. Will I have pain when I am home? The length of time each person has pain or discomfort varies. You may still have some pain when you go home and will probably be taking pain medication. Follow the guidelines below. . Take your medications as directed and as needed. . Call your doctor if the medication prescribed for you doesn't relieve your pain. . Don't drive or drink alcohol while you're taking prescription pain medication. . As your incision heals, you will have less pain and need less pain medication. A mild pain reliever such as acetaminophen (Tylenol) or ibuprofen (Advil) will relieve aches and discomfort. However, large quantities of acetaminophen may be harmful to your liver. Don't take more acetaminophen than the amount directed on the bottle or as instructed by your doctor or nurse. . Pain medication should help you as you resume your normal activities. Take enough medication to do your exercises comfortably. Pain medication is most effective 30 to 45 minutes after taking it. Marland Kitchen Keep track of when you take your pain medication. Taking it when your pain first begins is more effective than waiting for the pain to get worse. Pain medication may cause constipation (having fewer bowel movements than what is normal for you).  How can I prevent constipation? . Go to the bathroom at the same time every day. Your body will get used to going at that time. . If you feel the urge to go, don't put it off. Try to use the bathroom 5 to 15 minutes after meals. . After breakfast is a good time to move your bowels. The reflexes in your colon are strongest  at this time. . Exercise, if you can. Walking is an excellent form of exercise. . Drink 8 (8-ounce) glasses (2 liters) of liquids daily, if you can. Drink water, juices, soups, ice cream shakes, and other drinks that don't have caffeine. Drinks with caffeine, such as coffee and soda, pull fluid out of the body. . Slowly increase the fiber in your diet to 25 to 35 grams per day. Fruits, vegetables, whole grains, and cereals contain fiber. If you have an ostomy or have had recent bowel surgery, check with your doctor or nurse before making any changes in your diet. . Both over-the-counter and prescription medications are available to treat constipation. Start with 1 of the following over-the-counter medications first: o Docusate sodium (Colace) 100 mg. Take ___1__ capsules _2____ times a day. This is a stool softener that causes few side effects. Don't take it with mineral  oil. o Polyethylene glycol (MiraLAX) 17 grams daily. o Senna (Senokot) 2 tablets at bedtime. This is a stimulant laxative, which can cause cramping. . If you haven't had a bowel movement in 2 days, call your doctor or nurse.  Can I shower? Yes, you should shower 24 hours after your surgery. Be sure to shower every day. Taking a warm shower is relaxing and can help decrease muscle aches. Use soap when you shower and gently wash your incision. Pat the areas dry with a towel after showering, and leave your incision uncovered (unless there is drainage). Call your doctor if you see any redness or drainage from your incision. Don't take tub baths until you discuss it with your doctor at the first appointment after your surgery. How do I care for my incisions? You will have several small incisions on your abdomen. The incisions are closed with Steri-Strips or Dermabond. You may also have square white dressings on your incisions (Primapore). You can remove these in the shower 24 hours after your surgery. You should clean your incisions  with soap and water. If you go home with Steri-Strips on your incision, they will loosen and may fall off by themselves. If they haven't fallen off within 10 days, you can remove them. If you go home with Dermabond over your sutures (stitches), it will also loosen and peel off.  What are the most common symptoms after a hysterectomy? It's common for you to have some vaginal spotting or light bleeding. You should monitor this with a pad or a panty liner. If you have having heavy bleeding (bleeding through a pad or liner every 1 to 2 hours), call your doctor right away. It's also common to have some discomfort after surgery from the air that was pumped into your abdomen during surgery. To help with this, walk, drink plenty of liquids and make sure to take the stool softeners you received.  When is it safe for me to drive? You may resume driving 1 weeks after surgery, as long as you are not taking pain medication that may make you drowsy.  When can I resume sexual activity? Do not place anything in your vagina or have vaginal intercourse for 8 weeks after your surgery. Some people will need to wait longer than 8 weeks, so speak with your doctor before resuming sexual intercourse.  Will I be able to travel? Yes, you can travel. If you are traveling by plane within a few weeks after your surgery, make sure you get up and walk every hour. Be sure to stretch your legs, drink plenty of liquids, and keep your feet elevated when possible.  Will I need any supplies? Most people do not need any supplies after the surgery. In the rare case that you do need supplies, such as tubes or drains, your nurse will order them for you.  When can I return to work? The time it takes to return to work depends on the type of work you do, the type of surgery you had, and how fast your body heals. Most people can return to work about 2 to 4 weeks after the surgery.  What exercises can I do? Exercise will help you gain  strength and feel better. Walking and stair climbing are excellent forms of exercise. Gradually increase the distance you walk. Climb stairs slowly, resting or stopping as needed. Ask your doctor or nurse before starting more strenuous exercises.  When can I lift heavy objects? Most people should not lift anything  heavier than 10 pounds (4.5 kilograms) for at least 4 weeks after surgery. Speak with your doctor about when you can do heavy lifting.  How can I cope with my feelings? After surgery for a serious illness, you may have new and upsetting feelings. Many people say they felt weepy, sad, worried, nervous, irritable, and angry at one time or another. You may find that you can't control some of these feelings. If this happens, it's a good idea to seek emotional support. The first step in coping is to talk about how you feel. Family and friends can help. Your nurse, doctor, and social worker can reassure, support, and guide you. It's always a good idea to let these professionals know how you, your family, and your friends are feeling emotionally. Many resources are available to patients and their families. Whether you're in the hospital or at home, the nurses, doctors, and social workers are here to help you and your family and friends handle the emotional aspects of your illness.  When is my first appointment after surgery? Your first appointment after surgery will be 2 to 4 weeks after surgery. Your nurse will give you instructions on how to make this appointment, including the phone number to call.  What if I have other questions? If you have any questions or concerns, please talk with your doctor or nurse. You can reach them Monday through Friday from 9:00 am to 5:00 pm. After 5:00 pm, during the weekend, and on holidays, call [phone] and ask for the doctor on call for your doctor.  . Have a temperature of 101 F (38.3 C) or higher . Have pain that does not get better with pain  medication . Have redness, drainage, or swelling from your incisions

## 2020-06-21 NOTE — Discharge Summary (Signed)
Physician Discharge Summary  Patient ID: Christy Carpenter MRN: [id number] DOB/AGE: 62-Aug-1957 62 y.o.  Admit date: 06/20/2020 Discharge date: 06/21/2020  Admission Diagnoses: Endometrial cancer [facility])  Discharge Diagnoses:  Principal Problem:   Endometrial cancer ([place]) Active Problems:   Uncontrolled type 2 diabetes mellitus with hyperglycemia (HCC)   Obesity (BMI 30.0-34.9)   Endometriosis   Retroperitoneal fibrosis   Endometrial adenocarcinoma [facility])   Discharged Condition:  The patient is in good condition and stable for discharge.    Hospital Course: On 06/20/2020, the patient underwent the following: Procedure(s): XI ROBOTIC ASSISTED TOTAL HYSTERECTOMY WITH BILATERAL SALPINGO OOPHORECTOMY, RIGHT URETERAL LYSIS SENTINEL NODE BIOPSY. She was admitted for an overnight stay for close glucose monitoring and management. The postoperative course was uneventful.  She was discharged to home on postoperative day 1 tolerating a regular (carb mod) diet, voiding, pain controlled with oral medications, ambulating, back on home regimen for diabetes control. Regimen was clarified with Lovena Le at Dr. Almetta Lovely office (endocrinologist). Patient currently is taking Novolin R three times a day (75 units, 75 units, then 85 units at bedtime) and Novolin N three times a day (75 units, 75 units, and 65 units at bedtime) along with [place] and [place]a [place].  Consults: None  Significant Diagnostic Studies: None  Treatments: surgery: see above  Discharge Exam: Blood pressure 131/74, pulse 94, temperature 97.8 F (36.6 C), temperature source Oral, resp. rate 18, height 5' (1.524 m), weight 193 lb 3 oz (87.6 kg), SpO2 98 %. General appearance: alert, cooperative and no distress Resp: clear to auscultation bilaterally Cardio: regular rate and rhythm, S1, S2 normal, no murmur, click, rub or gallop GI: abdomen obese, active bowel sounds, soft Extremities: extremities normal, atraumatic, no cyanosis or  edema Incision/Wound: lap site incisions to the abdomen with dermabond intact with no active drainage noted  Disposition: Discharge disposition: 01-Home or Self Care       Discharge Instructions    Call MD for:  difficulty breathing, headache or visual disturbances   Complete by: As directed    Call MD for:  difficulty breathing, headache or visual disturbances   Complete by: As directed    Call MD for:  extreme fatigue   Complete by: As directed    Call MD for:  extreme fatigue   Complete by: As directed    Call MD for:  hives   Complete by: As directed    Call MD for:  persistant dizziness or light-headedness   Complete by: As directed    Call MD for:  persistant dizziness or light-headedness   Complete by: As directed    Call MD for:  persistant nausea and vomiting   Complete by: As directed    Call MD for:  persistant nausea and vomiting   Complete by: As directed    Call MD for:  redness, tenderness, or signs of infection (pain, swelling, redness, odor or green/yellow discharge around incision site)   Complete by: As directed    Call MD for:  redness, tenderness, or signs of infection (pain, swelling, redness, odor or green/yellow discharge around incision site)   Complete by: As directed    Call MD for:  severe uncontrolled pain   Complete by: As directed    Call MD for:  severe uncontrolled pain   Complete by: As directed    Call MD for:  temperature >100.4   Complete by: As directed    Call MD for:  temperature >100.4   Complete by: As directed  Diet - low sodium heart healthy   Complete by: As directed    Diet - low sodium heart healthy   Complete by: As directed    Driving Restrictions   Complete by: As directed    No driving x 72 hrs   Driving Restrictions   Complete by: As directed    No driving for 1 week.  Do not take narcotics and drive.   Increase activity slowly   Complete by: As directed    Gradually increase over 2 - 7 days   Increase  activity slowly   Complete by: As directed    Lifting restrictions   Complete by: As directed    Avoid lifting > 20 lbs x 2 weeks.   Lifting restrictions   Complete by: As directed    No lifting greater than 10 lbs.   May shower / Bathe   Complete by: As directed    May walk up steps   Complete by: As directed    Other Restrictions   Complete by: As directed    Exercise in 5 - 10 days.  Swim in 7 days.  Douching is not recommended.   Sexual Activity Restrictions   Complete by: As directed    No intercourse x 8 weeks   Sexual Activity Restrictions   Complete by: As directed    No sexual activity, nothing in the vagina, for 8 weeks.     Allergies as of 06/21/2020      Reactions   Byetta 10 Mcg Pen [exenatide] Itching   Januvia [sitagliptin]    Yeast infections    Invokana [canagliflozin] Other (See Comments)   Yeast infections      Medication List    STOP taking these medications   benzonatate 100 MG capsule Commonly known as: TESSALON   Fluticasone-Salmeterol 250-50 MCG/DOSE Aepb Commonly known as: Advair Diskus   megestrol 40 MG tablet Commonly known as: MEGACE   meloxicam 7.5 MG tablet Commonly known as: MOBIC   tizanidine 2 MG capsule Commonly known as: ZANAFLEX     TAKE these medications   amitriptyline 25 MG tablet Commonly known as: ELAVIL Take 25 mg by mouth at bedtime.   atorvastatin 40 MG tablet Commonly known as: LIPITOR Take 1 tablet (40 mg total) by mouth daily at 6 PM.   Farxiga 10 MG Tabs tablet Generic drug: dapagliflozin propanediol Take 10 mg by mouth daily.   fenofibrate micronized 134 MG capsule Commonly known as: LOFIBRA Take 134 mg by mouth daily.   FreeStyle Libre 2 Sensor Misc   Janumet XR 50-1000 MG Tb24 Generic drug: SitaGLIPtin-MetFORMIN HCl Take 1 tablet by mouth 2 (two) times daily.   levothyroxine 50 MCG tablet Commonly known as: SYNTHROID Take 50 mcg by mouth daily before breakfast.    losartan-hydrochlorothiazide 100-25 MG tablet Commonly known as: HYZAAR Take 1 tablet by mouth daily.   NovoLIN N FlexPen 100 UNIT/ML Kiwkpen Generic drug: Insulin NPH (Human) (Isophane) Inject 75 Units into the skin 3 (three) times daily.   NovoLIN R FlexPen 100 UNIT/ML Sopn Generic drug: Insulin Regular Human Inject 75 Units into the skin 3 (three) times daily.   OneTouch Ultra test strip Generic drug: glucose blood 1 each 3 (three) times daily.   oxyCODONE 5 MG immediate release tablet Commonly known as: Oxy IR/ROXICODONE Take 1 tablet (5 mg total) by mouth every 6 (six) hours as needed for severe pain. For AFTER surgery, do not take and drive   pregabalin 917 MG capsule  Commonly known as: LYRICA Take 150 mg by mouth 3 (three) times daily.   senna-docusate 8.6-50 MG tablet Commonly known as: Senokot-S Take 2 tablets by mouth at bedtime. For AFTER surgery, do not take if having diarrhea       Follow-up Information    [name], MD Follow up on 06/26/2020.   Specialty: Gynecologic Oncology Why: will be a PHONE visit at 4pm to discuss pathology with Dr. Denman George. IN PERSON visit will be on 07/08/20 at the [facility] at 1:45pm. Contact information: [address] [place] [address] [phone]               Greater than thirty minutes were spend for face to face discharge instructions and discharge orders/summary in EPIC.   Signed: Dorothyann Gibbs 06/21/2020, 9:56 AM

## 2020-06-23 ENCOUNTER — Emergency Department (HOSPITAL_COMMUNITY): Payer: BC Managed Care – PPO

## 2020-06-23 ENCOUNTER — Inpatient Hospital Stay (HOSPITAL_COMMUNITY)
Admission: EM | Admit: 2020-06-23 | Discharge: 2020-06-28 | DRG: 862 | Disposition: A | Discharge status: Home / Self Care | Payer: BC Managed Care – PPO | Attending: Gynecologic Oncology | Admitting: Gynecologic Oncology

## 2020-06-23 ENCOUNTER — Encounter (HOSPITAL_COMMUNITY): Payer: Self-pay

## 2020-06-23 ENCOUNTER — Other Ambulatory Visit: Payer: Self-pay

## 2020-06-23 DIAGNOSIS — Z8249 Family history of ischemic heart disease and other diseases of the circulatory system: Secondary | ICD-10-CM

## 2020-06-23 DIAGNOSIS — Z823 Family history of stroke: Secondary | ICD-10-CM

## 2020-06-23 DIAGNOSIS — Z20822 Contact with and (suspected) exposure to covid-19: Secondary | ICD-10-CM | POA: Diagnosis present

## 2020-06-23 DIAGNOSIS — Z833 Family history of diabetes mellitus: Secondary | ICD-10-CM

## 2020-06-23 DIAGNOSIS — Z888 Allergy status to other drugs, medicaments and biological substances status: Secondary | ICD-10-CM

## 2020-06-23 DIAGNOSIS — E039 Hypothyroidism, unspecified: Secondary | ICD-10-CM

## 2020-06-23 DIAGNOSIS — K219 Gastro-esophageal reflux disease without esophagitis: Secondary | ICD-10-CM

## 2020-06-23 DIAGNOSIS — E119 Type 2 diabetes mellitus without complications: Secondary | ICD-10-CM

## 2020-06-23 DIAGNOSIS — E8779 Other fluid overload: Secondary | ICD-10-CM

## 2020-06-23 DIAGNOSIS — G629 Polyneuropathy, unspecified: Secondary | ICD-10-CM

## 2020-06-23 DIAGNOSIS — Z90710 Acquired absence of both cervix and uterus: Secondary | ICD-10-CM

## 2020-06-23 DIAGNOSIS — R6520 Severe sepsis without septic shock: Secondary | ICD-10-CM

## 2020-06-23 DIAGNOSIS — E876 Hypokalemia: Secondary | ICD-10-CM | POA: Diagnosis not present

## 2020-06-23 DIAGNOSIS — T8144XA Sepsis following a procedure, initial encounter: Secondary | ICD-10-CM | POA: Diagnosis present

## 2020-06-23 DIAGNOSIS — R197 Diarrhea, unspecified: Secondary | ICD-10-CM | POA: Diagnosis present

## 2020-06-23 DIAGNOSIS — B966 Bacteroides fragilis [B. fragilis] as the cause of diseases classified elsewhere: Secondary | ICD-10-CM | POA: Diagnosis present

## 2020-06-23 DIAGNOSIS — R339 Retention of urine, unspecified: Secondary | ICD-10-CM

## 2020-06-23 DIAGNOSIS — E8770 Fluid overload, unspecified: Secondary | ICD-10-CM | POA: Diagnosis not present

## 2020-06-23 DIAGNOSIS — N809 Endometriosis, unspecified: Secondary | ICD-10-CM | POA: Diagnosis present

## 2020-06-23 DIAGNOSIS — R7881 Bacteremia: Secondary | ICD-10-CM

## 2020-06-23 DIAGNOSIS — E1165 Type 2 diabetes mellitus with hyperglycemia: Secondary | ICD-10-CM | POA: Diagnosis present

## 2020-06-23 DIAGNOSIS — Z6837 Body mass index (BMI) 37.0-37.9, adult: Secondary | ICD-10-CM

## 2020-06-23 DIAGNOSIS — E785 Hyperlipidemia, unspecified: Secondary | ICD-10-CM | POA: Diagnosis present

## 2020-06-23 DIAGNOSIS — Y838 Other surgical procedures as the cause of abnormal reaction of the patient, or of later complication, without mention of misadventure at the time of the procedure: Secondary | ICD-10-CM | POA: Diagnosis present

## 2020-06-23 DIAGNOSIS — E1169 Type 2 diabetes mellitus with other specified complication: Secondary | ICD-10-CM | POA: Diagnosis present

## 2020-06-23 DIAGNOSIS — E669 Obesity, unspecified: Secondary | ICD-10-CM | POA: Diagnosis present

## 2020-06-23 DIAGNOSIS — T8143XA Infection following a procedure, organ and space surgical site, initial encounter: Principal | ICD-10-CM | POA: Diagnosis present

## 2020-06-23 DIAGNOSIS — C541 Malignant neoplasm of endometrium: Secondary | ICD-10-CM | POA: Diagnosis present

## 2020-06-23 DIAGNOSIS — Z79899 Other long term (current) drug therapy: Secondary | ICD-10-CM

## 2020-06-23 DIAGNOSIS — E7800 Pure hypercholesterolemia, unspecified: Secondary | ICD-10-CM | POA: Diagnosis present

## 2020-06-23 DIAGNOSIS — Z79890 Hormone replacement therapy (postmenopausal): Secondary | ICD-10-CM

## 2020-06-23 DIAGNOSIS — E11649 Type 2 diabetes mellitus with hypoglycemia without coma: Secondary | ICD-10-CM | POA: Diagnosis present

## 2020-06-23 DIAGNOSIS — I10 Essential (primary) hypertension: Secondary | ICD-10-CM

## 2020-06-23 DIAGNOSIS — R9431 Abnormal electrocardiogram [ECG] [EKG]: Secondary | ICD-10-CM

## 2020-06-23 DIAGNOSIS — R0989 Other specified symptoms and signs involving the circulatory and respiratory systems: Secondary | ICD-10-CM | POA: Diagnosis not present

## 2020-06-23 DIAGNOSIS — R6510 Systemic inflammatory response syndrome (SIRS) of non-infectious origin without acute organ dysfunction: Secondary | ICD-10-CM | POA: Diagnosis present

## 2020-06-23 DIAGNOSIS — N135 Crossing vessel and stricture of ureter without hydronephrosis: Secondary | ICD-10-CM | POA: Diagnosis present

## 2020-06-23 DIAGNOSIS — I959 Hypotension, unspecified: Secondary | ICD-10-CM | POA: Diagnosis present

## 2020-06-23 DIAGNOSIS — A419 Sepsis, unspecified organism: Secondary | ICD-10-CM

## 2020-06-23 DIAGNOSIS — A4189 Other specified sepsis: Secondary | ICD-10-CM | POA: Diagnosis present

## 2020-06-23 DIAGNOSIS — Z7984 Long term (current) use of oral hypoglycemic drugs: Secondary | ICD-10-CM

## 2020-06-23 DIAGNOSIS — Z809 Family history of malignant neoplasm, unspecified: Secondary | ICD-10-CM

## 2020-06-23 DIAGNOSIS — R188 Other ascites: Secondary | ICD-10-CM

## 2020-06-23 DIAGNOSIS — R300 Dysuria: Secondary | ICD-10-CM | POA: Diagnosis present

## 2020-06-23 DIAGNOSIS — E1142 Type 2 diabetes mellitus with diabetic polyneuropathy: Secondary | ICD-10-CM | POA: Diagnosis present

## 2020-06-23 DIAGNOSIS — E66811 Obesity, class 1: Secondary | ICD-10-CM | POA: Diagnosis present

## 2020-06-23 LAB — COMPREHENSIVE METABOLIC PANEL
ALT: 24 U/L (ref 0–44)
AST: 25 U/L (ref 15–41)
Albumin: 3.5 g/dL (ref 3.5–5.0)
Alkaline Phosphatase: 118 U/L (ref 38–126)
Anion gap: 10 (ref 5–15)
BUN: 18 mg/dL (ref 8–23)
CO2: 18 mmol/L — ABNORMAL LOW (ref 22–32)
Calcium: 8.7 mg/dL — ABNORMAL LOW (ref 8.9–10.3)
Chloride: 107 mmol/L (ref 98–111)
Creatinine, Ser: 1.18 mg/dL — ABNORMAL HIGH (ref 0.44–1.00)
GFR, Estimated: 52 mL/min — ABNORMAL LOW (ref >60)
Glucose, Bld: 160 mg/dL — ABNORMAL HIGH (ref 70–99)
Potassium: 3.5 mmol/L (ref 3.5–5.1)
Sodium: 135 mmol/L (ref 135–145)
Total Bilirubin: 0.8 mg/dL (ref 0.3–1.2)
Total Protein: 7.5 g/dL (ref 6.5–8.1)

## 2020-06-23 LAB — CBC WITH DIFFERENTIAL/PLATELET
Abs Immature Granulocytes: 0.04 10*3/uL (ref 0.00–0.07)
Basophils Absolute: 0 10*3/uL (ref 0.0–0.1)
Basophils Relative: 0 %
Eosinophils Absolute: 0 10*3/uL (ref 0.0–0.5)
Eosinophils Relative: 0 %
HCT: 31.9 % — ABNORMAL LOW (ref 36.0–46.0)
Hemoglobin: 10.7 g/dL — ABNORMAL LOW (ref 12.0–15.0)
Immature Granulocytes: 1 %
Lymphocytes Relative: 9 %
Lymphs Abs: 0.7 10*3/uL (ref 0.7–4.0)
MCH: 29.6 pg (ref 26.0–34.0)
MCHC: 33.5 g/dL (ref 30.0–36.0)
MCV: 88.4 fL (ref 80.0–100.0)
Monocytes Absolute: 0.1 10*3/uL (ref 0.1–1.0)
Monocytes Relative: 1 %
Neutro Abs: 7.4 10*3/uL (ref 1.7–7.7)
Neutrophils Relative %: 89 %
Platelets: 191 10*3/uL (ref 150–400)
RBC: 3.61 MIL/uL — ABNORMAL LOW (ref 3.87–5.11)
RDW: 13.5 % (ref 11.5–15.5)
WBC: 8.3 10*3/uL (ref 4.0–10.5)
nRBC: 0 % (ref 0.0–0.2)

## 2020-06-23 LAB — BRAIN NATRIURETIC PEPTIDE: B Natriuretic Peptide: 161.5 pg/mL — ABNORMAL HIGH (ref 0.0–100.0)

## 2020-06-23 LAB — RESPIRATORY PANEL BY RT PCR (FLU A&B, COVID)
Influenza A by PCR: NEGATIVE
Influenza B by PCR: NEGATIVE
SARS Coronavirus 2 by RT PCR: NEGATIVE

## 2020-06-23 LAB — TROPONIN I (HIGH SENSITIVITY): Troponin I (High Sensitivity): 19 ng/L — ABNORMAL HIGH (ref <18)

## 2020-06-23 LAB — APTT: aPTT: 31 seconds (ref 24–36)

## 2020-06-23 LAB — LACTIC ACID, PLASMA: Lactic Acid, Venous: 2.7 mmol/L (ref 0.5–1.9)

## 2020-06-23 LAB — PROTIME-INR
INR: 1.2 (ref 0.8–1.2)
Prothrombin Time: 14.4 seconds (ref 11.4–15.2)

## 2020-06-23 MED ORDER — VANCOMYCIN HCL IN DEXTROSE 1-5 GM/200ML-% IV SOLN
1000.0000 mg | Freq: Once | INTRAVENOUS | Status: AC
  Administered 2020-06-24: 1000 mg via INTRAVENOUS
  Filled 2020-06-23: qty 200

## 2020-06-23 MED ORDER — METRONIDAZOLE IN NACL 5-0.79 MG/ML-% IV SOLN
500.0000 mg | Freq: Once | INTRAVENOUS | Status: AC
  Administered 2020-06-23: 500 mg via INTRAVENOUS
  Filled 2020-06-23: qty 100

## 2020-06-23 MED ORDER — FENTANYL CITRATE (PF) 100 MCG/2ML IJ SOLN
100.0000 ug | Freq: Once | INTRAMUSCULAR | Status: AC
  Administered 2020-06-23: 100 ug via INTRAVENOUS
  Filled 2020-06-23: qty 2

## 2020-06-23 MED ORDER — ACETAMINOPHEN 325 MG PO TABS
650.0000 mg | ORAL_TABLET | Freq: Once | ORAL | Status: AC
  Administered 2020-06-23: 650 mg via ORAL
  Filled 2020-06-23: qty 2

## 2020-06-23 MED ORDER — LACTATED RINGERS IV SOLN: INTRAVENOUS | Status: DC

## 2020-06-23 MED ORDER — SODIUM CHLORIDE 0.9 % IV SOLN
2.0000 g | Freq: Once | INTRAVENOUS | Status: AC
  Administered 2020-06-23: 2 g via INTRAVENOUS
  Filled 2020-06-23: qty 2

## 2020-06-23 MED ORDER — LACTATED RINGERS IV BOLUS (SEPSIS)
1000.0000 mL | Freq: Once | INTRAVENOUS | Status: AC
  Administered 2020-06-23: 1000 mL via INTRAVENOUS

## 2020-06-23 MED ORDER — ONDANSETRON HCL 4 MG/2ML IJ SOLN: 4.0000 mg | Freq: Once | INTRAMUSCULAR | Status: DC

## 2020-06-23 MED ORDER — IOHEXOL 350 MG/ML SOLN
100.0000 mL | Freq: Once | INTRAVENOUS | Status: AC | PRN
  Administered 2020-06-24: 100 mL via INTRAVENOUS

## 2020-06-23 MED ORDER — SODIUM CHLORIDE (PF) 0.9 % IJ SOLN
INTRAMUSCULAR | Status: AC
  Filled 2020-06-23: qty 50

## 2020-06-23 NOTE — Progress Notes (Signed)
A consult was received from an ED physician for vancomycin and cefepime per pharmacy dosing.  The patient's profile has been reviewed for ht/wt/allergies/indication/available labs.   A one time order has been placed for vancomcyin 1gm and cefepime 2gm.    Further antibiotics/pharmacy consults should be ordered by admitting physician if indicated.                       Thank you, Dolly Rias RPh 06/23/2020, 10:34 PM

## 2020-06-23 NOTE — ED Provider Notes (Signed)
[facility] DEPT Provider Note   CSN: [id number] Arrival date & time: 06/23/20  2124     History Chief Complaint  Patient presents with  . Code Sepsis    Christy Carpenter is a 64 y.o. female with history of grade 1 endometrial cancer s/p robotic assisted total hysterectomy with bilateral salpingo-oophorectomy on 11/4, diabetes mellitus type 2, obesity, HTN who presents the emergency department with a chief complaint of fever.  The patient underwent a robotic assisted total hysterectomy with bilateral salpingo-oophorectomy on 11/4. She is being evaluated in the ER on postop day 3.  She was discharged from the hospital on Saturday, 11/6. Per discharge summary, postoperative course was uneventful. She was tolerating a diet with oral medications at discharge. At home, she reports that she was having severe, abdominal pain and had difficulty walking. States that she has just been feeling generally unwell since being discharged home.  Pain remained severe on Sunday, 11/6. That night, she had one episode of vomiting that she describes as "black, tarry, and oily" and it felt that she was she was having GERD related symptoms.  Today, on 11/7, she developed chills and rigors around 19:00 after having an episode of diffuse sweating so her husband brought her to the ER for further evaluation. She did not know that she was febrile until she arrived in the ER.  She reports that she has been having burning dysuria since yesterday and worsening bilateral low back pain. She also reports that she is having some aching chest pain and chest congestion with a nonproductive cough for the last few days. Her last bowel movement, which was yesterday was normal. No melena, hematochezia, diarrhea, vaginal bleeding or discharge, hematuria, hemoptysis, or URI symptoms. She denies any worsening swelling or distention to her abdomen today or leg swelling. She has been passing some flatus.  No  redness or warmth noted to the abdomen. No drainage from surgical scars.  She reports that her blood sugars have been well controlled at home. She is fully vaccinated against COVID-19. Prior to her surgery, she was on very high doses of Megace progestin for the last few months. She does note that she has put on 30 pounds since June.       The history is provided by the patient and medical records. No language interpreter was used.       Past Medical History:  Diagnosis Date  . Cancer ([place])    endometreal ut  . Diabetes mellitus   . GERD (gastroesophageal reflux disease)   . Hypercholesterolemia   . Hypertension   . Neuropathy of both feet   . Plantar fasciitis    Left foot    Patient Active Problem List   Diagnosis Date Noted  . SIRS due to infectious process with acute organ dysfunction ([place]) 06/24/2020  . SIRS (systemic inflammatory response syndrome) ([place]) 06/24/2020  . Endometrial adenocarcinoma ([place]) 06/20/2020  . Endometriosis   . Retroperitoneal fibrosis   . Diabetic neuropathy ([place]) 02/23/2020  . Endometrial cancer ([place]) 02/23/2020  . Obesity (BMI 30.0-34.9) 02/23/2020  . Chest pain 08/19/2017  . Uncontrolled type 2 diabetes mellitus with hyperglycemia ([place]) 08/19/2017  . Essential hypertension 08/19/2017  . Hypercholesterolemia     Past Surgical History:  Procedure Laterality Date  . HAND LIGAMENT RECONSTRUCTION    . ROBOTIC ASSISTED TOTAL HYSTERECTOMY WITH BILATERAL SALPINGO OOPHERECTOMY N/A 06/20/2020   Procedure: XI ROBOTIC ASSISTED TOTAL HYSTERECTOMY WITH BILATERAL SALPINGO OOPHORECTOMY, RIGHT URETERAL LYSIS;  Surgeon: Denman George,  Terrence Dupont, MD;  Location: [facility];  Service: Gynecology;  Laterality: N/A;  . SENTINEL NODE BIOPSY N/A 06/20/2020   Procedure: SENTINEL NODE BIOPSY;  Surgeon: [name], MD;  Location: [facility];  Service: Gynecology;  Laterality: N/A;     OB History   No obstetric history on file.     Family History  Problem Relation Age of Onset  .  Diabetes Mellitus II Mother   . Stroke Mother   . Hypertension Mother   . Stroke Brother   . Diabetes Mellitus II Brother   . Hypertension Brother   . Cancer Paternal Aunt        ovarian    Social History   Tobacco Use  . Smoking status: Never Smoker  . Smokeless tobacco: Never Used  Vaping Use  . Vaping Use: Never used  Substance Use Topics  . Alcohol use: No  . Drug use: No    Home Medications Prior to Admission medications   Medication Sig Start Date End Date Taking? Authorizing Provider  amitriptyline (ELAVIL) 25 MG tablet Take 25 mg by mouth at bedtime.   Yes [provider]  atorvastatin (LIPITOR) 40 MG tablet Take 1 tablet (40 mg total) by mouth daily at 6 PM. 08/20/17  Yes Sheikh, [place], DO  Continuous Blood Gluc Sensor ([place] 2 SENSOR) [place]  06/13/20  Yes [provider]  dapagliflozin propanediol (FARXIGA) 10 MG TABS tablet Take 10 mg by mouth daily.    Yes [provider]  fenofibrate micronized (LOFIBRA) 134 MG capsule Take 134 mg by mouth daily.   Yes [provider]  levothyroxine (SYNTHROID) 50 MCG tablet Take 50 mcg by mouth daily before breakfast.   Yes [provider]  losartan-hydrochlorothiazide (HYZAAR) 100-25 MG tablet Take 1 tablet by mouth daily.   Yes [provider]  NOVOLIN N FLEXPEN 100 UNIT/ML Kiwkpen Inject 75 Units into the skin 3 (three) times daily.  04/17/20  Yes [provider]  NOVOLIN R FLEXPEN 100 UNIT/ML SOPN Inject 75 Units into the skin 3 (three) times daily.  04/17/20  Yes [provider]  ONETOUCH ULTRA test strip 1 each 3 (three) times daily. 04/19/20  Yes [provider]  oxyCODONE (OXY IR/ROXICODONE) 5 MG immediate release tablet Take 1 tablet (5 mg total) by mouth every 6 (six) hours as needed for severe pain. For AFTER surgery, do not take and drive 63/0/16  Yes Cross, Melissa D, NP  pregabalin (LYRICA) 150 MG capsule Take 150 mg by mouth 3  (three) times daily.    Yes [provider]  senna-docusate (SENOKOT-S) 8.6-50 MG tablet Take 2 tablets by mouth at bedtime. For AFTER surgery, do not take if having diarrhea 06/21/20  Yes Cross, Melissa D, NP  SitaGLIPtin-MetFORMIN HCl (JANUMET XR) 50-1000 MG TB24 Take 1 tablet by mouth 2 (two) times daily.   Yes [provider]    Allergies    Byetta 10 mcg pen [exenatide], Januvia [sitagliptin], and Invokana [canagliflozin]  Review of Systems   Review of Systems  Constitutional: Positive for chills, diaphoresis and fever. Negative for activity change.  HENT: Positive for congestion. Negative for sinus pressure, sinus pain and sore throat.   Eyes: Negative for visual disturbance.  Respiratory: Positive for cough and shortness of breath. Negative for wheezing.   Cardiovascular: Positive for chest pain. Negative for palpitations and leg swelling.  Gastrointestinal: Positive for abdominal distention, abdominal pain, nausea and vomiting. Negative for anal bleeding, blood in stool, constipation  and diarrhea.  Genitourinary: Positive for dysuria and pelvic pain. Negative for frequency, hematuria, vaginal bleeding, vaginal discharge and vaginal pain.  Musculoskeletal: Positive for back pain. Negative for gait problem, joint swelling, neck pain and neck stiffness.  Skin: Positive for wound. Negative for color change and rash.  Allergic/Immunologic: Negative for immunocompromised state.  Neurological: Negative for dizziness, speech difficulty, weakness, light-headedness, numbness and headaches.  Psychiatric/Behavioral: Negative for confusion.    Physical Exam Updated Vital Signs BP (!) 123/99 (BP Location: Right Arm)   Pulse (!) 119   Temp 99.9 F (37.7 C) (Oral)   Resp 20   Ht 5' (1.524 m)   Wt 87.6 kg   SpO2 98%   BMI 37.72 kg/m   Physical Exam Vitals and nursing note reviewed.  Constitutional:      General: She is not in acute distress.    Appearance: She is  ill-appearing. She is not toxic-appearing or diaphoretic.  HENT:     Head: Normocephalic.     Mouth/Throat:     Mouth: Mucous membranes are dry.  Eyes:     Conjunctiva/sclera: Conjunctivae normal.  Cardiovascular:     Rate and Rhythm: Regular rhythm. Tachycardia present.     Pulses: Normal pulses.     Heart sounds: Normal heart sounds. No murmur heard.  No friction rub. No gallop.   Pulmonary:     Effort: Pulmonary effort is normal. No respiratory distress.     Comments: Tachypneic with conversational dyspnea, taking a breath every 3-4 words. She has some crackles in the bilateral bases.  Abdominal:     General: There is distension.     Palpations: Abdomen is soft. There is no mass.     Tenderness: There is abdominal tenderness. There is no right CVA tenderness, left CVA tenderness, guarding or rebound.     Hernia: No hernia is present.     Comments: Abdomen is distended and firm. Hypoactive bowel sounds in all 4 quadrants. Although she does have diffuse tenderness palpation, maximal tenderness is in the left upper and lower quadrants. There is no guarding or rebound. Surgical scars are well-healing. No drainage no surrounding induration, erythema, or warmth.  Musculoskeletal:        General: Tenderness present.     Cervical back: Normal range of motion and neck supple.     Right lower leg: No edema.     Left lower leg: No edema.     Comments: Diffusely tender to palpation to the bilateral low back. Spine is nontender.  Skin:    General: Skin is warm.     Capillary Refill: Capillary refill takes 2 to 3 seconds.     Findings: No rash.  Neurological:     Mental Status: She is alert.  Psychiatric:        Behavior: Behavior normal.     ED Results / Procedures / Treatments   Labs (all labs ordered are listed, but only abnormal results are displayed) Labs Reviewed  LACTIC ACID, PLASMA - Abnormal; Notable for the following components:      Result Value   Lactic Acid, Venous 2.7  (*)    All other components within normal limits  COMPREHENSIVE METABOLIC PANEL - Abnormal; Notable for the following components:   CO2 18 (*)    Glucose, Bld 160 (*)    Creatinine, Ser 1.18 (*)    Calcium 8.7 (*)    GFR, Estimated 52 (*)    All other components within normal limits  CBC WITH  DIFFERENTIAL/PLATELET - Abnormal; Notable for the following components:   RBC 3.61 (*)    Hemoglobin 10.7 (*)    HCT 31.9 (*)    All other components within normal limits  URINALYSIS, ROUTINE W REFLEX MICROSCOPIC - Abnormal; Notable for the following components:   Glucose, UA >=500 (*)    All other components within normal limits  BRAIN NATRIURETIC PEPTIDE - Abnormal; Notable for the following components:   B Natriuretic Peptide 161.5 (*)    All other components within normal limits  LACTIC ACID, PLASMA - Abnormal; Notable for the following components:   Lactic Acid, Venous 2.5 (*)    All other components within normal limits  TROPONIN I (HIGH SENSITIVITY) - Abnormal; Notable for the following components:   Troponin I (High Sensitivity) 19 (*)    All other components within normal limits  TROPONIN I (HIGH SENSITIVITY) - Abnormal; Notable for the following components:   Troponin I (High Sensitivity) 46 (*)    All other components within normal limits  RESPIRATORY PANEL BY RT PCR (FLU A&B, COVID)  CULTURE, BLOOD (ROUTINE X 2)  CULTURE, BLOOD (ROUTINE X 2)  URINE CULTURE  PROTIME-INR  APTT  BASIC METABOLIC PANEL  CBC WITH DIFFERENTIAL/PLATELET    EKG EKG Interpretation  Date/Time:  Sunday June 23 2020 21:50:47 EST Ventricular Rate:  134 PR Interval:    QRS Duration: 104 QT Interval:  405 QTC Calculation: 605 R Axis:   48 Text Interpretation: Sinus tachycardia Low voltage, precordial leads Minimal ST depression, inferior leads ST elevation, consider lateral injury Prolonged QT interval SINCE LAST TRACING HEART RATE HAS INCREASED Confirmed by Malvin Johns [phone]) on 06/23/2020  10:08:43 PM   Radiology CT Angio Chest PE W and/or Wo Contrast  Result Date: 06/24/2020 CLINICAL DATA:  Abdominal pain, fever post operatively, recent robotic TAH BSO 06/20/2020 EXAM: CT ANGIOGRAPHY CHEST CT ABDOMEN AND PELVIS WITH CONTRAST TECHNIQUE: Multidetector CT imaging of the chest was performed using the standard protocol during bolus administration of intravenous contrast. Multiplanar CT image reconstructions and MIPs were obtained to evaluate the vascular anatomy. Multidetector CT imaging of the abdomen and pelvis was performed using the standard protocol during bolus administration of intravenous contrast. CONTRAST:  146mL OMNIPAQUE IOHEXOL 350 MG/ML SOLN COMPARISON:  CT chest 08/19/2017 pelvic ultrasound 04/10/2020 FINDINGS: CTA CHEST FINDINGS Cardiovascular: Satisfactory opacification the pulmonary arteries without large central or lobar filling defects. More distal evaluation limited by respiratory motion artifact. Central pulmonary arteries are normal caliber. Normal heart size. No pericardial effusion. Coronary artery calcifications are present. The aortic root is suboptimally assessed given cardiac pulsation artifact. The aorta is normal caliber. No acute luminal abnormality of the imaged aorta. No periaortic stranding or hemorrhage. Normal 3 vessel branching of the aortic arch. Proximal great vessels are unremarkable. No major venous abnormalities. Mediastinum/Nodes: No mediastinal fluid or gas. Normal thyroid gland and thoracic inlet. No acute abnormality of the esophagus. Fairly significant posterior bowing of the trachea and central airways, greater than is typically expected for imaging during exhalation, could reflect some degree of tracheal bronchomalacia, correlate with clinical features. No worrisome mediastinal, hilar or axillary adenopathy. Lungs/Pleura: Evaluation limited by respiratory motion artifact. Low lung volumes likely accentuated by imaging during exhalation for the  angiographic technique. No consolidation, features of edema, pneumothorax, or effusion. Tiny 3 mm nodule in the periphery of the left upper lobe, new from prior (4/28). No other concerning pulmonary nodules or masses. Musculoskeletal: Multilevel degenerative changes are present in the imaged portions of the spine.  No acute osseous abnormality or suspicious osseous lesion. Worrisome chest wall lesions. Review of the MIP images confirms the above findings. CT ABDOMEN and PELVIS FINDINGS Hepatobiliary: Diffuse hepatic hypoattenuation compatible with hepatic steatosis. Sparing along the gallbladder fossa. Smooth liver surface contour. No worrisome focal liver lesions. Normal gallbladder and biliary tree. Pancreas: No pancreatic ductal dilatation or surrounding inflammatory changes. Spleen: Normal in size. No concerning splenic lesions. Adrenals/Urinary Tract: Normal adrenal glands. Kidneys are normally located with symmetric enhancement and excretion. No suspicious renal mass or urolithiasis. Asymmetric right urinary tract dilatation with mild urothelial thickening and periureteral hazy stranding which courses into the immediate vicinity of postsurgical changes present in the deep pelvis and detailed below. No visible obstructive urolithiasis is seen. Some fairly focal posterior bladder wall thickening. No intraluminal gas. Stomach/Bowel: Distal esophagus, stomach and duodenal sweep are unremarkable. No small bowel wall thickening or dilatation. No evidence of obstruction. Appendix is not visualized. No focal inflammation the vicinity of the cecum to suggest an occult appendicitis. No proximal colonic thickening or dilatation. Rectosigmoid thickening is present in the vicinity of the postsurgical change with some perirectal fat stranding as well. Vascular/Lymphatic: Atherosclerotic calcifications within the abdominal aorta and branch vessels. No aneurysm or ectasia. No enlarged abdominopelvic lymph nodes. Reproductive:  Postsurgical changes from recent total abdominal hysterectomy and bilateral salpingo-oophorectomy with some lobular collection of air and fluid about the surgical site some of which is demonstrating some early peripheral rim enhancement which may reflect a developing abscess or collection with associated thickening of the adjacent colon and bladder. Largest component of this collection is present along the posterior bladder wall measuring 3.2 x 5.6 x 5.1 cm in maximal dimensions and an elongated configuration. Poorly discernible fat planes between the rectosigmoid and bladder wall and this collection. Other: Pelvic fluid collection, as above. Additional free fluid layering in the presacral space and pericolic gutters is favored to be redistributed. Some postsurgical changes are seen in the anterior abdominal wall including few foci of subcutaneous gas likely at the site of port placement. Some mild body wall edema as well. Some focal skin thickening and subcutaneous fat stranding along the anterior abdominal wall may also be related to injectable use such as heparin. Musculoskeletal: No acute osseous abnormality or suspicious osseous lesion. Multilevel degenerative changes are present in the imaged portions of the spine. Musculature is normal and symmetric. Review of the MIP images confirms the above findings. IMPRESSION: 1. No central or lobar pulmonary artery emboli. More distal evaluation limited by respiratory motion artifact. 2. Postsurgical changes from recent total abdominal hysterectomy and bilateral salpingo-oophorectomy with a lobular collection of air and fluid about the surgical site/vaginal cuff some of which is demonstrating some early peripheral rim enhancement which may reflect a developing abscess with associated thickening of the adjacent colon and bladder. Injury of bowel bladder is difficult to fully exclude the absence of luminal contrast media. 3. Asymmetric right urinary tract dilatation with  mild urothelial thickening and periureteral hazy stranding which courses into the vicinity of postsurgical changes present in the deep pelvis and collection above. No visible obstructive urolithiasis is seen. Findings could reflect inflammatory stricture secondary to the above process. Furthermore could correlate with urinalysis to exclude concomitant urinary tract infection. 4. New 3 mm nodule in the periphery of the left upper lobe. Consider three to six-month follow-up imaging given patient's history of known malignancy. 5. Fairly significant posterior bowing of the trachea and central airways, greater than is typically expected for imaging during exhalation,  could reflect some degree of tracheobronchomalacia, correlate with clinical features. 6. Hepatic steatosis. These results were called by telephone at the time of interpretation on 06/24/2020 at 12:46 am to provider Shirell Struthers [facility] , who verbally acknowledged these results. Electronically Signed   By: Lovena Le M.D.   On: 06/24/2020 00:49   CT ABDOMEN PELVIS W CONTRAST  Result Date: 06/24/2020 CLINICAL DATA:  Abdominal pain, fever post operatively, recent robotic TAH BSO 06/20/2020 EXAM: CT ANGIOGRAPHY CHEST CT ABDOMEN AND PELVIS WITH CONTRAST TECHNIQUE: Multidetector CT imaging of the chest was performed using the standard protocol during bolus administration of intravenous contrast. Multiplanar CT image reconstructions and MIPs were obtained to evaluate the vascular anatomy. Multidetector CT imaging of the abdomen and pelvis was performed using the standard protocol during bolus administration of intravenous contrast. CONTRAST:  117mL OMNIPAQUE IOHEXOL 350 MG/ML SOLN COMPARISON:  CT chest 08/19/2017 pelvic ultrasound 04/10/2020 FINDINGS: CTA CHEST FINDINGS Cardiovascular: Satisfactory opacification the pulmonary arteries without large central or lobar filling defects. More distal evaluation limited by respiratory motion artifact. Central pulmonary  arteries are normal caliber. Normal heart size. No pericardial effusion. Coronary artery calcifications are present. The aortic root is suboptimally assessed given cardiac pulsation artifact. The aorta is normal caliber. No acute luminal abnormality of the imaged aorta. No periaortic stranding or hemorrhage. Normal 3 vessel branching of the aortic arch. Proximal great vessels are unremarkable. No major venous abnormalities. Mediastinum/Nodes: No mediastinal fluid or gas. Normal thyroid gland and thoracic inlet. No acute abnormality of the esophagus. Fairly significant posterior bowing of the trachea and central airways, greater than is typically expected for imaging during exhalation, could reflect some degree of tracheal bronchomalacia, correlate with clinical features. No worrisome mediastinal, hilar or axillary adenopathy. Lungs/Pleura: Evaluation limited by respiratory motion artifact. Low lung volumes likely accentuated by imaging during exhalation for the angiographic technique. No consolidation, features of edema, pneumothorax, or effusion. Tiny 3 mm nodule in the periphery of the left upper lobe, new from prior (4/28). No other concerning pulmonary nodules or masses. Musculoskeletal: Multilevel degenerative changes are present in the imaged portions of the spine. No acute osseous abnormality or suspicious osseous lesion. Worrisome chest wall lesions. Review of the MIP images confirms the above findings. CT ABDOMEN and PELVIS FINDINGS Hepatobiliary: Diffuse hepatic hypoattenuation compatible with hepatic steatosis. Sparing along the gallbladder fossa. Smooth liver surface contour. No worrisome focal liver lesions. Normal gallbladder and biliary tree. Pancreas: No pancreatic ductal dilatation or surrounding inflammatory changes. Spleen: Normal in size. No concerning splenic lesions. Adrenals/Urinary Tract: Normal adrenal glands. Kidneys are normally located with symmetric enhancement and excretion. No  suspicious renal mass or urolithiasis. Asymmetric right urinary tract dilatation with mild urothelial thickening and periureteral hazy stranding which courses into the immediate vicinity of postsurgical changes present in the deep pelvis and detailed below. No visible obstructive urolithiasis is seen. Some fairly focal posterior bladder wall thickening. No intraluminal gas. Stomach/Bowel: Distal esophagus, stomach and duodenal sweep are unremarkable. No small bowel wall thickening or dilatation. No evidence of obstruction. Appendix is not visualized. No focal inflammation the vicinity of the cecum to suggest an occult appendicitis. No proximal colonic thickening or dilatation. Rectosigmoid thickening is present in the vicinity of the postsurgical change with some perirectal fat stranding as well. Vascular/Lymphatic: Atherosclerotic calcifications within the abdominal aorta and branch vessels. No aneurysm or ectasia. No enlarged abdominopelvic lymph nodes. Reproductive: Postsurgical changes from recent total abdominal hysterectomy and bilateral salpingo-oophorectomy with some lobular collection of air and fluid about the  surgical site some of which is demonstrating some early peripheral rim enhancement which may reflect a developing abscess or collection with associated thickening of the adjacent colon and bladder. Largest component of this collection is present along the posterior bladder wall measuring 3.2 x 5.6 x 5.1 cm in maximal dimensions and an elongated configuration. Poorly discernible fat planes between the rectosigmoid and bladder wall and this collection. Other: Pelvic fluid collection, as above. Additional free fluid layering in the presacral space and pericolic gutters is favored to be redistributed. Some postsurgical changes are seen in the anterior abdominal wall including few foci of subcutaneous gas likely at the site of port placement. Some mild body wall edema as well. Some focal skin thickening  and subcutaneous fat stranding along the anterior abdominal wall may also be related to injectable use such as heparin. Musculoskeletal: No acute osseous abnormality or suspicious osseous lesion. Multilevel degenerative changes are present in the imaged portions of the spine. Musculature is normal and symmetric. Review of the MIP images confirms the above findings. IMPRESSION: 1. No central or lobar pulmonary artery emboli. More distal evaluation limited by respiratory motion artifact. 2. Postsurgical changes from recent total abdominal hysterectomy and bilateral salpingo-oophorectomy with a lobular collection of air and fluid about the surgical site/vaginal cuff some of which is demonstrating some early peripheral rim enhancement which may reflect a developing abscess with associated thickening of the adjacent colon and bladder. Injury of bowel bladder is difficult to fully exclude the absence of luminal contrast media. 3. Asymmetric right urinary tract dilatation with mild urothelial thickening and periureteral hazy stranding which courses into the vicinity of postsurgical changes present in the deep pelvis and collection above. No visible obstructive urolithiasis is seen. Findings could reflect inflammatory stricture secondary to the above process. Furthermore could correlate with urinalysis to exclude concomitant urinary tract infection. 4. New 3 mm nodule in the periphery of the left upper lobe. Consider three to six-month follow-up imaging given patient's history of known malignancy. 5. 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 6. Hepatic steatosis. These results were called by telephone at the time of interpretation on 06/24/2020 at 12:46 am to provider Mollie Rossano [facility] , who verbally acknowledged these results. Electronically Signed    By: Lovena Le M.D.   On: 06/24/2020 00:49   DG Chest Port 1 View  Result Date: 06/23/2020 CLINICAL DATA:  Questionable sepsis EXAM: PORTABLE CHEST 1 VIEW COMPARISON:  CT 08/19/2017, radiograph 04/06/2020 FINDINGS: Accounting for body habitus, the lungs are clear. No consolidation, features of edema, pneumothorax, or effusion. Pulmonary vascularity is normally distributed. The cardiomediastinal contours are unremarkable. No acute osseous or soft tissue abnormality. Telemetry leads overlie the chest. IMPRESSION: No acute cardiopulmonary abnormality. Electronically Signed   By: Lovena Le M.D.   On: 06/23/2020 22:56    Procedures .Critical Care Performed by: Joanne Gavel, PA-C Authorized by: Joanne Gavel, PA-C   Critical care provider statement:    Critical care time (minutes):  60   Critical care time was exclusive of:  Separately billable procedures and treating other patients and teaching time   Critical care was necessary to treat or prevent imminent or life-threatening deterioration of the following conditions:  Sepsis   Critical care was time spent personally by me on the following activities:  Ordering and performing treatments and interventions, ordering and review of laboratory studies,  ordering and review of radiographic studies, pulse oximetry, re-evaluation of patient's condition, obtaining history from patient or surrogate, examination of patient, evaluation of patient's response to treatment, development of treatment plan with patient or surrogate, discussions with consultants and review of old charts   I assumed direction of critical care for this patient from another provider in my specialty: no     (including critical care time)  Medications Ordered in ED Medications  lactated ringers infusion ( Intravenous New Bag/Given 06/23/20 2310)  heparin injection 5,000 Units (5,000 Units Subcutaneous Given 06/24/20 0156)  lactated ringers infusion (has no administration in time  range)  piperacillin-tazobactam (ZOSYN) IVPB 3.375 g (has no administration in time range)  acetaminophen (TYLENOL) tablet 650 mg (has no administration in time range)  HYDROmorphone (DILAUDID) injection 0.2-0.6 mg (has no administration in time range)  ondansetron (ZOFRAN) tablet 4 mg (has no administration in time range)    Or  ondansetron (ZOFRAN) injection 4 mg (has no administration in time range)  insulin aspart (novoLOG) injection 0-20 Units (has no administration in time range)  insulin aspart (novoLOG) injection 0-5 Units (has no administration in time range)  ceFEPIme (MAXIPIME) 2 g in sodium chloride 0.9 % 100 mL IVPB (0 g Intravenous Stopped 06/23/20 2315)  metroNIDAZOLE (FLAGYL) IVPB 500 mg (0 mg Intravenous Stopped 06/24/20 0033)  vancomycin (VANCOCIN) IVPB 1000 mg/200 mL premix (0 mg Intravenous Stopped 06/24/20 0132)  lactated ringers bolus 1,000 mL (0 mLs Intravenous Stopped 06/24/20 0033)  acetaminophen (TYLENOL) tablet 650 mg (650 mg Oral Given 06/23/20 2251)  fentaNYL (SUBLIMAZE) injection 100 mcg (100 mcg Intravenous Given 06/23/20 2252)  sodium chloride (PF) 0.9 % injection (  Given 06/24/20 0117)  iohexol (OMNIPAQUE) 350 MG/ML injection 100 mL (100 mLs Intravenous Contrast Given 06/24/20 0009)  fentaNYL (SUBLIMAZE) injection 75 mcg (75 mcg Intravenous Given 06/24/20 0042)  lactated ringers bolus 1,000 mL (0 mLs Intravenous Stopped 06/24/20 0141)    ED Course  I have reviewed the triage vital signs and the nursing notes.  Pertinent labs & imaging results that were available during my care of the patient were reviewed by me and considered in my medical decision making (see chart for details).  Clinical Course as of Jun 24 157  Mon Jun 24, 2020  0042 Patient recheck.  She has not produced any urine in 2 and half hours.  Although CT impression has not resulted, urinary bladder appears very distended.  Discussed placing a Foley catheter with the patient bedside.  She would like  to attempt one more time to use the pure wick.  She is agreeable to Foley catheter placement if no urine is produced in the next 10 minutes.  Foley catheter order placed.   [MM]  0112 Spoke with Dr. Denman George, gynecologic oncology, who will plan to admit the patient.   [MM]  0117 Lactic Acid, Venous(!!): 2.5 [MM]  0118 CO2(!): 18 [MM]  0130 RN reports that Foley catheter has drained over 1300 cc of urine.  Repeat EKG with significantly improved QTC.  No longer prolonged.   [MM]  4496 UA is not concerning for infection.   [MM]    Clinical Course User Index [MM] Perrin Smack   MDM Rules/Calculators/A&P                          64 year old female with history of grade 1 endometrial cancer s/p robotic assisted total hysterectomy with bilateral salpingo-oophorectomy on 11/4, diabetes mellitus type  2, obesity, HTN presenting with fever, chills, chest pain, congestion, cough dysuria, and worsening abdominal and low back pain.  She is tachycardic in the 130s. Febrile to 101.8. She has mild tachypnea, but no hypoxia. She is normotensive.  On exam, she appears short of breath. Her abdomen is distended and firm. She has hypoactive bowel sounds in all 4 quadrants.  Given her symptoms, I am concerned that there could be multiple processes that are attributing to her symptoms today. With the fever and chills in the setting of the patient being postop day 3, she is also having dysuria, cough, shortness of breath. Could consider pneumonia versus UTI. However, her abdomen is also firm and distended. Could also consider ileus versus bowel obstruction. She did also note an episode of black, tarry emesis. No history of GI bleed. Will order Hemoccult.   However, patient also has many risk factors for PE, including BMI, recent surgery and hospitalization, and history of cancer. She will need a PE study while she is in the ER.  Patient meets sepsis criteria. Code sepsis was initiated and she was started on  broad-spectrum antibiotics with vancomycin, Flagyl, and cefepime.  Labs have been reviewed and independently interpreted by me. She has a lactic acidosis 2.5 ->2.7 after receiving 1 L of fluid. Will give patient 30 cc per kilogram bolus based on BMI of greater than 35 per sepsis order set. Patient does not have leukocytosis or left shift at this time.  Initial troponin is minimally elevated at 19. I suspect this is secondary to demand. However, we'll continue to trend as she does endorse some chest pain. Of note, her QTC is prolonged. Will avoid QTC prolonging agents. Will check magnesium level. She does not have any significant metabolic derangements as potassium is 3.5.  Imaging has been reviewed and independently interpreted by me. Chest x-ray is unremarkable without acute findings. PE study is negative. However, there is a lung nodule that need to be followed given her history of endometrial cancer adenocarcinoma. CT abdomen pelvis with developing fluid collection with some early peripheral rim enhancement that could suggest a developing abscess with associated thickening of the adjacent colon and bladder. Injury of bowel and bladder is difficult to fully exclude given lack of p.o. contrast. There is an asymmetric right urinary tract dilatation with mild urethral thickening and periureteral hazy stranding that could suggest UTI. No evidence of obstructive urolithiasis. Patient's UA is pending at this time.  However, she has been endorsing dysuria for the last 2 days. Urine culture has been ordered. Blood cultures were obtained prior to antibiotic administration.  Consulted gynecologic oncology and spoke with Dr. Denman George, the patient's surgeon. Dr. Denman George will plan for admission to the hospital. Given the size of fluid collection, she does not anticipate surgical intervention at this time. Will allow patient to drink fluids.  01:57-UA is not concerning for infection.  The patient appears reasonably  stabilized for admission considering the current resources, flow, and capabilities available in the ED at this time, and I doubt any other [facility] requiring further screening and/or treatment in the ED prior to admission.   Final Clinical Impression(s) / ED Diagnoses Final diagnoses:  Sepsis without acute organ dysfunction, due to unspecified organism [facility])  Urinary retention  QT prolongation    Rx / DC Orders ED Discharge Orders    None       Sufian Ravi A, PA-C 06/24/20 0158    Molpus, Jenny Reichmann, MD 06/24/20 [phone]

## 2020-06-23 NOTE — Progress Notes (Signed)
Code Sepsis Initiated at 2227 PM. Elink following.   Allyce Bochicchio eLink RN

## 2020-06-23 NOTE — ED Triage Notes (Signed)
biba from home tachycardic 140s, 100.2 temp, 40 RR. Endorsing "chills" A&Ox4 upon arrival. Had ovary removal last thurs was c/o abd pain. 20g iv r fa upon arrival

## 2020-06-24 ENCOUNTER — Telehealth: Payer: Self-pay | Admitting: Oncology

## 2020-06-24 DIAGNOSIS — E669 Obesity, unspecified: Secondary | ICD-10-CM | POA: Diagnosis present

## 2020-06-24 DIAGNOSIS — I10 Essential (primary) hypertension: Secondary | ICD-10-CM | POA: Diagnosis present

## 2020-06-24 DIAGNOSIS — R6520 Severe sepsis without septic shock: Secondary | ICD-10-CM

## 2020-06-24 DIAGNOSIS — T8143XA Infection following a procedure, organ and space surgical site, initial encounter: Secondary | ICD-10-CM | POA: Diagnosis not present

## 2020-06-24 DIAGNOSIS — R6510 Systemic inflammatory response syndrome (SIRS) of non-infectious origin without acute organ dysfunction: Secondary | ICD-10-CM | POA: Diagnosis present

## 2020-06-24 DIAGNOSIS — G629 Polyneuropathy, unspecified: Secondary | ICD-10-CM | POA: Diagnosis not present

## 2020-06-24 DIAGNOSIS — E1169 Type 2 diabetes mellitus with other specified complication: Secondary | ICD-10-CM | POA: Diagnosis present

## 2020-06-24 DIAGNOSIS — Y838 Other surgical procedures as the cause of abnormal reaction of the patient, or of later complication, without mention of misadventure at the time of the procedure: Secondary | ICD-10-CM | POA: Diagnosis present

## 2020-06-24 DIAGNOSIS — R197 Diarrhea, unspecified: Secondary | ICD-10-CM | POA: Diagnosis present

## 2020-06-24 DIAGNOSIS — B966 Bacteroides fragilis [B. fragilis] as the cause of diseases classified elsewhere: Secondary | ICD-10-CM | POA: Diagnosis present

## 2020-06-24 DIAGNOSIS — E118 Type 2 diabetes mellitus with unspecified complications: Secondary | ICD-10-CM | POA: Diagnosis not present

## 2020-06-24 DIAGNOSIS — R9431 Abnormal electrocardiogram [ECG] [EKG]: Secondary | ICD-10-CM | POA: Diagnosis present

## 2020-06-24 DIAGNOSIS — A419 Sepsis, unspecified organism: Secondary | ICD-10-CM

## 2020-06-24 DIAGNOSIS — I959 Hypotension, unspecified: Secondary | ICD-10-CM | POA: Diagnosis present

## 2020-06-24 DIAGNOSIS — E785 Hyperlipidemia, unspecified: Secondary | ICD-10-CM | POA: Diagnosis present

## 2020-06-24 DIAGNOSIS — E1142 Type 2 diabetes mellitus with diabetic polyneuropathy: Secondary | ICD-10-CM | POA: Diagnosis present

## 2020-06-24 DIAGNOSIS — E1165 Type 2 diabetes mellitus with hyperglycemia: Secondary | ICD-10-CM | POA: Diagnosis present

## 2020-06-24 DIAGNOSIS — N739 Female pelvic inflammatory disease, unspecified: Secondary | ICD-10-CM | POA: Diagnosis not present

## 2020-06-24 DIAGNOSIS — C541 Malignant neoplasm of endometrium: Secondary | ICD-10-CM | POA: Diagnosis present

## 2020-06-24 DIAGNOSIS — T383X5A Adverse effect of insulin and oral hypoglycemic [antidiabetic] drugs, initial encounter: Secondary | ICD-10-CM

## 2020-06-24 DIAGNOSIS — A4189 Other specified sepsis: Secondary | ICD-10-CM | POA: Diagnosis present

## 2020-06-24 DIAGNOSIS — Z90710 Acquired absence of both cervix and uterus: Secondary | ICD-10-CM | POA: Diagnosis not present

## 2020-06-24 DIAGNOSIS — E160 Drug-induced hypoglycemia without coma: Secondary | ICD-10-CM

## 2020-06-24 DIAGNOSIS — Z6837 Body mass index (BMI) 37.0-37.9, adult: Secondary | ICD-10-CM | POA: Diagnosis not present

## 2020-06-24 DIAGNOSIS — E876 Hypokalemia: Secondary | ICD-10-CM | POA: Diagnosis not present

## 2020-06-24 DIAGNOSIS — E11649 Type 2 diabetes mellitus with hypoglycemia without coma: Secondary | ICD-10-CM | POA: Diagnosis present

## 2020-06-24 DIAGNOSIS — T8144XA Sepsis following a procedure, initial encounter: Secondary | ICD-10-CM | POA: Diagnosis present

## 2020-06-24 DIAGNOSIS — E7800 Pure hypercholesterolemia, unspecified: Secondary | ICD-10-CM | POA: Diagnosis present

## 2020-06-24 DIAGNOSIS — Z20822 Contact with and (suspected) exposure to covid-19: Secondary | ICD-10-CM | POA: Diagnosis present

## 2020-06-24 DIAGNOSIS — K219 Gastro-esophageal reflux disease without esophagitis: Secondary | ICD-10-CM | POA: Diagnosis present

## 2020-06-24 DIAGNOSIS — E8770 Fluid overload, unspecified: Secondary | ICD-10-CM | POA: Diagnosis not present

## 2020-06-24 DIAGNOSIS — E039 Hypothyroidism, unspecified: Secondary | ICD-10-CM | POA: Diagnosis present

## 2020-06-24 HISTORY — DX: Sepsis, unspecified organism: R65.20

## 2020-06-24 HISTORY — DX: Sepsis, unspecified organism: A41.9

## 2020-06-24 LAB — CBC WITH DIFFERENTIAL/PLATELET
Abs Immature Granulocytes: 0.16 10*3/uL — ABNORMAL HIGH (ref 0.00–0.07)
Basophils Absolute: 0 10*3/uL (ref 0.0–0.1)
Basophils Relative: 0 %
Eosinophils Absolute: 0 10*3/uL (ref 0.0–0.5)
Eosinophils Relative: 0 %
HCT: 30.2 % — ABNORMAL LOW (ref 36.0–46.0)
Hemoglobin: 9.9 g/dL — ABNORMAL LOW (ref 12.0–15.0)
Immature Granulocytes: 1 %
Lymphocytes Relative: 9 %
Lymphs Abs: 1.1 10*3/uL (ref 0.7–4.0)
MCH: 30.3 pg (ref 26.0–34.0)
MCHC: 32.8 g/dL (ref 30.0–36.0)
MCV: 92.4 fL (ref 80.0–100.0)
Monocytes Absolute: 0.7 10*3/uL (ref 0.1–1.0)
Monocytes Relative: 6 %
Neutro Abs: 10.5 10*3/uL — ABNORMAL HIGH (ref 1.7–7.7)
Neutrophils Relative %: 84 %
Platelets: 178 10*3/uL (ref 150–400)
RBC: 3.27 MIL/uL — ABNORMAL LOW (ref 3.87–5.11)
RDW: 13.6 % (ref 11.5–15.5)
WBC: 12.6 10*3/uL — ABNORMAL HIGH (ref 4.0–10.5)
nRBC: 0 % (ref 0.0–0.2)

## 2020-06-24 LAB — BASIC METABOLIC PANEL
Anion gap: 12 (ref 5–15)
BUN: 19 mg/dL (ref 8–23)
CO2: 19 mmol/L — ABNORMAL LOW (ref 22–32)
Calcium: 8.7 mg/dL — ABNORMAL LOW (ref 8.9–10.3)
Chloride: 105 mmol/L (ref 98–111)
Creatinine, Ser: 1.17 mg/dL — ABNORMAL HIGH (ref 0.44–1.00)
GFR, Estimated: 53 mL/min — ABNORMAL LOW (ref >60)
Glucose, Bld: 89 mg/dL (ref 70–99)
Potassium: 3.8 mmol/L (ref 3.5–5.1)
Sodium: 136 mmol/L (ref 135–145)

## 2020-06-24 LAB — LACTIC ACID, PLASMA
Lactic Acid, Venous: 2.4 mmol/L (ref 0.5–1.9)
Lactic Acid, Venous: 2.5 mmol/L (ref 0.5–1.9)
Lactic Acid, Venous: 3.5 mmol/L (ref 0.5–1.9)

## 2020-06-24 LAB — CBG MONITORING, ED
Glucose-Capillary: 68 mg/dL — ABNORMAL LOW (ref 70–99)
Glucose-Capillary: 114 mg/dL — ABNORMAL HIGH (ref 70–99)
Glucose-Capillary: 101 mg/dL — ABNORMAL HIGH (ref 70–99)
Glucose-Capillary: 104 mg/dL — ABNORMAL HIGH (ref 70–99)
Glucose-Capillary: 86 mg/dL (ref 70–99)
Glucose-Capillary: 85 mg/dL (ref 70–99)

## 2020-06-24 LAB — URINALYSIS, ROUTINE W REFLEX MICROSCOPIC
Bacteria, UA: NONE SEEN
Bilirubin Urine: NEGATIVE
Glucose, UA: >=500 mg/dL — AB
Hgb urine dipstick: NEGATIVE
Ketones, ur: NEGATIVE mg/dL
Leukocytes,Ua: NEGATIVE
Nitrite: NEGATIVE
Protein, ur: NEGATIVE mg/dL
Specific Gravity, Urine: 1.015 (ref 1.005–1.030)
pH: 5 (ref 5.0–8.0)

## 2020-06-24 LAB — GLUCOSE, CAPILLARY
Glucose-Capillary: 70 mg/dL (ref 70–99)
Glucose-Capillary: 97 mg/dL (ref 70–99)
Glucose-Capillary: 105 mg/dL — ABNORMAL HIGH (ref 70–99)

## 2020-06-24 LAB — TROPONIN I (HIGH SENSITIVITY): Troponin I (High Sensitivity): 46 ng/L — ABNORMAL HIGH (ref <18)

## 2020-06-24 MED ORDER — ONDANSETRON HCL 4 MG/2ML IJ SOLN
4.0000 mg | Freq: Four times a day (QID) | INTRAMUSCULAR | Status: DC | PRN
  Administered 2020-06-25 – 2020-06-27 (×4): 4 mg via INTRAVENOUS
  Filled 2020-06-24 (×4): qty 2

## 2020-06-24 MED ORDER — HYDROMORPHONE HCL 1 MG/ML IJ SOLN
0.2000 mg | INTRAMUSCULAR | Status: DC | PRN
  Administered 2020-06-24 (×4): 0.6 mg via INTRAVENOUS
  Administered 2020-06-25 (×2): 0.5 mg via INTRAVENOUS
  Administered 2020-06-25: 0.6 mg via INTRAVENOUS
  Administered 2020-06-26 (×5): 0.5 mg via INTRAVENOUS
  Filled 2020-06-24 (×14): qty 1

## 2020-06-24 MED ORDER — ACETAMINOPHEN 325 MG PO TABS
650.0000 mg | ORAL_TABLET | ORAL | Status: DC | PRN
  Filled 2020-06-24: qty 2

## 2020-06-24 MED ORDER — ACETAMINOPHEN 325 MG PO TABS
650.0000 mg | ORAL_TABLET | Freq: Four times a day (QID) | ORAL | Status: DC | PRN
  Administered 2020-06-27: 09:00:00 650 mg via ORAL
  Filled 2020-06-24: qty 2

## 2020-06-24 MED ORDER — LACTATED RINGERS IV SOLN: INTRAVENOUS | Status: DC

## 2020-06-24 MED ORDER — LACTATED RINGERS IV BOLUS (SEPSIS)
1000.0000 mL | Freq: Once | INTRAVENOUS | Status: AC
  Administered 2020-06-24: 1000 mL via INTRAVENOUS

## 2020-06-24 MED ORDER — DEXTROSE 50 % IV SOLN
12.5000 g | Freq: Once | INTRAVENOUS | Status: AC
  Administered 2020-06-24: 12.5 g via INTRAVENOUS
  Filled 2020-06-24: qty 50

## 2020-06-24 MED ORDER — PIPERACILLIN-TAZOBACTAM 3.375 G IVPB
3.3750 g | Freq: Three times a day (TID) | INTRAVENOUS | Status: DC
  Administered 2020-06-24 – 2020-06-27 (×8): 3.375 g via INTRAVENOUS
  Filled 2020-06-24 (×9): qty 50

## 2020-06-24 MED ORDER — FENTANYL CITRATE (PF) 100 MCG/2ML IJ SOLN
75.0000 ug | Freq: Once | INTRAMUSCULAR | Status: AC
  Administered 2020-06-24: 75 ug via INTRAVENOUS
  Filled 2020-06-24: qty 2

## 2020-06-24 MED ORDER — ONDANSETRON HCL 4 MG PO TABS: 4.0000 mg | ORAL_TABLET | Freq: Four times a day (QID) | ORAL | Status: DC | PRN

## 2020-06-24 MED ORDER — LACTATED RINGERS IV BOLUS (SEPSIS)
500.0000 mL | Freq: Once | INTRAVENOUS | Status: DC
  Administered 2020-06-24: 500 mL via INTRAVENOUS

## 2020-06-24 MED ORDER — HEPARIN SODIUM (PORCINE) 5000 UNIT/ML IJ SOLN
5000.0000 [IU] | Freq: Three times a day (TID) | INTRAMUSCULAR | Status: DC
  Administered 2020-06-24 – 2020-06-28 (×15): 5000 [IU] via SUBCUTANEOUS
  Filled 2020-06-24 (×13): qty 1

## 2020-06-24 MED ORDER — KETOROLAC TROMETHAMINE 15 MG/ML IJ SOLN
15.0000 mg | Freq: Once | INTRAMUSCULAR | Status: AC
  Administered 2020-06-24: 15 mg via INTRAVENOUS
  Filled 2020-06-24: qty 1

## 2020-06-24 MED ORDER — INSULIN ASPART 100 UNIT/ML ~~LOC~~ SOLN
0.0000 [IU] | Freq: Every day | SUBCUTANEOUS | Status: DC
  Filled 2020-06-24: qty 0.05

## 2020-06-24 MED ORDER — ACETAMINOPHEN 650 MG RE SUPP
650.0000 mg | Freq: Four times a day (QID) | RECTAL | Status: DC | PRN
  Administered 2020-06-24: 650 mg via RECTAL
  Filled 2020-06-24: qty 1

## 2020-06-24 MED ORDER — PIPERACILLIN-TAZOBACTAM 3.375 G IVPB
3.3750 g | Freq: Three times a day (TID) | INTRAVENOUS | Status: DC
  Administered 2020-06-24 (×2): 3.375 g via INTRAVENOUS
  Filled 2020-06-24 (×2): qty 50

## 2020-06-24 MED ORDER — INSULIN ASPART 100 UNIT/ML ~~LOC~~ SOLN
0.0000 [IU] | Freq: Three times a day (TID) | SUBCUTANEOUS | Status: DC
  Filled 2020-06-24: qty 0.2

## 2020-06-24 NOTE — ED Notes (Signed)
Patient transported to CT 

## 2020-06-24 NOTE — Telephone Encounter (Signed)
Called Dr. Almetta Lovely office and spoke to her assistant Lovena Le.  Asked if patient will need to be on a form of basal insulin as well as resistant sliding scale insulin.  Also advised patient is in the hospital and will be NPO for 24 hours. Lovena Le will advise Dr. Chalmers Cater and have her call back with recommendations.

## 2020-06-24 NOTE — Progress Notes (Signed)
Elink Code Sepsis Completion Note:  BC drawn before ABX administered. LA down to 2.4 from 2.7. Had 2,950 ml of IVF, meeting allotted 2,628 ml per body weight. Pt is being admitted to a med-surg unit for further monitoring.   Bo Rogue eLInk RN

## 2020-06-24 NOTE — Telephone Encounter (Signed)
Christy Carpenter from Dr. Almetta Lovely office left a message with recommendation from Dr. Chalmers Cater for Dr. Denman George to follow the hospital protocol for insulin resistant patients.

## 2020-06-24 NOTE — ED Notes (Signed)
Provider made aware of critical lab values and pt experiencing a lot of pressure in her lower abdomen. Dr. Denman George on her way to see pt.

## 2020-06-24 NOTE — Consult Note (Signed)
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  Hospitalist service was consulted to help with diabetes management.  Patient just received IV Dilaudid and is slightly drowsy and a poor historian.  She denies current nausea or vomiting.  States that her pain is well controlled at this time.  She has had fevers and chills along with some nausea and vomiting over the last few days.  Review of Systems:  Patient is currently drowsy and full review of systems could not be done   Past Medical History: Past Medical History:  Diagnosis Date  . Cancer ([place])    endometreal ut  . Diabetes mellitus   . GERD (gastroesophageal reflux disease)   . Hypercholesterolemia   . Hypertension   . Neuropathy of both feet   . Plantar fasciitis    Left foot    Past Surgical History: Past Surgical History:  Procedure Laterality Date  . HAND LIGAMENT RECONSTRUCTION    . ROBOTIC ASSISTED TOTAL HYSTERECTOMY WITH BILATERAL SALPINGO OOPHERECTOMY N/A 06/20/2020   Procedure: XI ROBOTIC ASSISTED TOTAL HYSTERECTOMY WITH BILATERAL SALPINGO OOPHORECTOMY, RIGHT URETERAL LYSIS;  Surgeon: [name], MD;  Location: [facility];  Service: Gynecology;  Laterality: N/A;  . SENTINEL NODE BIOPSY N/A 06/20/2020   Procedure: SENTINEL NODE BIOPSY;  Surgeon: [name], MD;  Location: [facility];  Service: Gynecology;  Laterality: N/A;     Allergies:   Allergies  Allergen Reactions  . Byetta  10 Mcg Pen [Exenatide] Itching  . Januvia [Sitagliptin]     Yeast infections   . Invokana [Canagliflozin] Other (See Comments)    Yeast infections     Social History:  reports that she has never smoked. She has never used smokeless tobacco. She reports that she does not drink alcohol and does not use drugs.   Family History: Family History  Problem Relation Age of Onset  . Diabetes Mellitus II Mother   . Stroke Mother   . Hypertension Mother   . Stroke Brother   . Diabetes Mellitus II Brother   . Hypertension Brother   . Cancer Paternal Aunt        ovarian      Physical Exam: Vitals:   06/24/20 1030 06/24/20 1100 06/24/20 1213 06/24/20 1322  BP: 120/74 124/77 116/70 116/66  Pulse: 99 (!) 101 99 (!) 103  Resp: (!) 26 (!) 25 16 16   Temp:   99.3 F (37.4 C) (!) 97.4 F (36.3 C)  TempSrc:   Oral Oral  SpO2: 96% 96% 100% 97%  Weight:      Height:        Constitutional: No acute distress.  Looks older than stated age.  Sitting on chair but slightly drowsy.  Poor historian. Eyes: PERRL, lids and conjunctivae normal ENMT: Mucous membranes are moist. Posterior pharynx clear of any exudate or lesions. Neck: normal, supple, no masses, no thyromegaly Respiratory: bilateral decreased breath sounds at bases, no wheezing; scattered crackles.  Intermittently tachypneic.  No accessory  muscle use.  Cardiovascular: S1 S2 positive, intermittently tachycardic.  Trace lower extremity edema present.  2+ pedal pulses.  Abdomen: Mildly tender diffusely, slightly distended, obese no masses palpated. No hepatosplenomegaly. Bowel sounds positive.  Musculoskeletal: no clubbing / cyanosis. No joint deformity upper and lower extremities.  Skin: no rashes, lesions, ulcers. No induration Neurologic: CN 2-12 grossly intact. Moving extremities. No focal neurologic deficits.  Drowsy, poor historian Psychiatric: Could not be assessed because of patient being drowsy.  Data reviewed:  I have  personally reviewed following labs and imaging studies Labs:  CBC: Recent Labs  Lab 06/21/20 0504 06/23/20 2147  WBC 9.4 8.3  NEUTROABS  --  7.4  HGB 10.5* 10.7*  HCT 30.9* 31.9*  MCV 89.3 88.4  PLT 199 315    Basic Metabolic Panel: Recent Labs  Lab 06/21/20 0504 06/21/20 0504 06/23/20 2147 06/24/20 0800  NA 135  --  135 136  K 4.0   < > 3.5 3.8  CL 103  --  107 105  CO2 21*  --  18* 19*  GLUCOSE 191*  --  160* 89  BUN 18  --  18 19  CREATININE 1.04*  --  1.18* 1.17*  CALCIUM 8.6*  --  8.7* 8.7*   < > = values in this interval not displayed.   GFR Estimated Creatinine Clearance: 49 mL/min (A) (by C-G formula based on SCr of 1.17 mg/dL (H)). Liver Function Tests: Recent Labs  Lab 06/23/20 2147  AST 25  ALT 24  ALKPHOS 118  BILITOT 0.8  PROT 7.5  ALBUMIN 3.5   No results for input(s): LIPASE, AMYLASE in the last 168 hours. No results for input(s): AMMONIA in the last 168 hours. Coagulation profile Recent Labs  Lab 06/23/20 2147  INR 1.2    Cardiac Enzymes: No results for input(s): CKTOTAL, CKMB, CKMBINDEX, TROPONINI in the last 168 hours. BNP: Invalid input(s): POCBNP CBG: Recent Labs  Lab 06/24/20 0432 06/24/20 0634 06/24/20 0811 06/24/20 0959 06/24/20 1257  GLUCAP 101* 104* 86 85 70   D-Dimer No results for input(s): DDIMER in the last 72 hours. Hgb A1c No results for input(s): HGBA1C in the last 72 hours. Lipid Profile No results for input(s): CHOL, HDL, LDLCALC, TRIG, CHOLHDL, LDLDIRECT in the last 72 hours. Thyroid function studies No results for input(s): TSH, T4TOTAL, T3FREE, THYROIDAB in the last 72 hours.  Invalid input(s): FREET3 Anemia work up No results for input(s): VITAMINB12, FOLATE, FERRITIN, TIBC, IRON, RETICCTPCT in the last 72 hours. Urinalysis    Component Value Date/Time   COLORURINE YELLOW 06/24/2020 0109   APPEARANCEUR CLEAR 06/24/2020 0109   LABSPEC 1.015 06/24/2020 0109   PHURINE 5.0 06/24/2020 0109    GLUCOSEU >=500 (A) 06/24/2020 0109   HGBUR NEGATIVE 06/24/2020 0109   BILIRUBINUR NEGATIVE 06/24/2020 0109   KETONESUR NEGATIVE 06/24/2020 0109   PROTEINUR NEGATIVE 06/24/2020 0109   UROBILINOGEN 0.2 04/06/2020 1431   NITRITE NEGATIVE 06/24/2020 0109   LEUKOCYTESUR NEGATIVE 06/24/2020 0109     Microbiology Recent Results (from the past 240 hour(s))  SARS CORONAVIRUS 2 (TAT 6-24 HRS) Nasopharyngeal Nasopharyngeal Swab     Status: None   Collection Time: 06/17/20  1:16 PM   Specimen: Nasopharyngeal Swab  Result Value Ref Range Status   SARS Coronavirus 2 NEGATIVE NEGATIVE Final    Comment: (NOTE) SARS-CoV-2 target nucleic acids are NOT DETECTED.  The SARS-CoV-2 RNA is generally detectable in upper and lower respiratory specimens during the acute phase of infection. Negative results do  not preclude SARS-CoV-2 infection, do not rule out co-infections with other pathogens, and should not be used as the sole basis for treatment or other patient management decisions. Negative results must be combined with clinical observations, patient history, and epidemiological information. The expected result is Negative.  Fact Sheet for Patients: SugarRoll.be  Fact Sheet for Healthcare Providers: https://www.woods-mathews.com/  This test is not yet approved or cleared by the [place] FDA and  has been authorized for detection and/or diagnosis of SARS-CoV-2 by FDA under an Emergency Use Authorization (EUA). This EUA will remain  in effect (meaning this test can be used) for the duration of the COVID-19 declaration under Se ction 564(b)(1) of the Act, 21 U.S.C. section 360bbb-3(b)(1), unless the authorization is terminated or revoked sooner.  Performed at [facility], [place] [address] [place], [place] [address]   Blood culture (routine x 2)     Status: None (Preliminary result)   Collection Time: 06/23/20  9:47 PM   Specimen: BLOOD   Result Value Ref Range Status   Specimen Description   Final    BLOOD LEFT ANTECUBITAL Performed at [facility] [address] [place], [place] [address]    Special Requests   Final    BOTTLES DRAWN AEROBIC AND ANAEROBIC Blood Culture adequate volume Performed at [facility] [address] [place], [place] [address]    Culture   Final    NO GROWTH < 12 HOURS Performed at [facility] [address], [place], [place] [address]    Report Status PENDING  Incomplete  Respiratory Panel by RT PCR (Flu A&B, Covid) - Nasopharyngeal Swab     Status: None   Collection Time: 06/23/20  9:54 PM   Specimen: Nasopharyngeal Swab  Result Value Ref Range Status   SARS Coronavirus 2 by RT PCR NEGATIVE NEGATIVE Final    Comment: (NOTE) SARS-CoV-2 target nucleic acids are NOT DETECTED.  The SARS-CoV-2 RNA is generally detectable in upper respiratoy specimens during the acute phase of infection. The lowest concentration of SARS-CoV-2 viral copies this assay can detect is 131 copies/mL. A negative result does not preclude SARS-Cov-2 infection and should not be used as the sole basis for treatment or other patient management decisions. A negative result may occur with  improper specimen collection/handling, submission of specimen other than nasopharyngeal swab, presence of viral mutation(s) within the areas targeted by this assay, and inadequate number of viral copies (<131 copies/mL). A negative result must be combined with clinical observations, patient history, and epidemiological information. The expected result is Negative.  Fact Sheet for Patients:  PinkCheek.be  Fact Sheet for Healthcare Providers:  GravelBags.it  This test is no t yet approved or cleared by the [place] FDA and  has been authorized for detection and/or diagnosis of SARS-CoV-2 by FDA under an Emergency Use  Authorization (EUA). This EUA will remain  in effect (meaning this test can be used) for the duration of the COVID-19 declaration under Section 564(b)(1) of the Act, 21 U.S.C. section 360bbb-3(b)(1), unless the authorization is terminated or revoked sooner.     Influenza A by PCR NEGATIVE NEGATIVE Final   Influenza B by PCR NEGATIVE NEGATIVE Final    Comment: (NOTE) The Xpert Xpress SARS-CoV-2/FLU/RSV assay is intended as an aid in  the diagnosis of influenza from Nasopharyngeal swab specimens and  should not be used as a sole basis for treatment. Nasal washings and  aspirates are unacceptable for Xpert Xpress SARS-CoV-2/FLU/RSV  testing.  Fact Sheet for Patients: PinkCheek.be  Fact Sheet for Healthcare Providers: GravelBags.it  This test is not yet approved or cleared by the [place] FDA and  has been authorized for detection and/or diagnosis of SARS-CoV-2 by  FDA under an Emergency Use Authorization (EUA). This EUA will remain  in effect (meaning this test can be used) for the duration of the  Covid-19 declaration under Section 564(b)(1) of the Act, 21  U.S.C. section 360bbb-3(b)(1), unless the authorization is  terminated or revoked. Performed at [facility], [place] [address], [place], [place] [address]   Blood culture (routine x 2)     Status: None (Preliminary result)   Collection Time: 06/23/20 10:36 PM   Specimen: BLOOD  Result Value Ref Range Status   Specimen Description   Final    BLOOD LEFT ANTECUBITAL Performed at [facility] [address], [place], [place] [address]    Special Requests   Final    BOTTLES DRAWN AEROBIC AND ANAEROBIC Blood Culture adequate volume Performed at [facility] [address], [place], [place] [address]    Culture   Final    NO GROWTH < 12 HOURS Performed at [facility] [address], [place],  [place] [address]    Report Status PENDING  Incomplete       Inpatient Medications:   Scheduled Meds: . heparin  5,000 Units Subcutaneous Q8H  . insulin aspart  0-20 Units Subcutaneous TID WC  . insulin aspart  0-5 Units Subcutaneous QHS   Continuous Infusions: . lactated ringers 125 mL/hr at 06/24/20 1239  . piperacillin-tazobactam (ZOSYN)  IV       Radiological Exams on Admission: CT Angio Chest PE W and/or Wo Contrast  Result Date: 06/24/2020 CLINICAL DATA:  Abdominal pain, fever post operatively, recent robotic TAH BSO 06/20/2020 EXAM: CT ANGIOGRAPHY CHEST CT ABDOMEN AND PELVIS WITH CONTRAST TECHNIQUE: Multidetector CT imaging of the chest was performed using the standard protocol during bolus administration of intravenous contrast. Multiplanar CT image reconstructions and MIPs were obtained to evaluate the vascular anatomy. Multidetector CT imaging of the abdomen and pelvis was performed using the standard protocol during bolus administration of intravenous contrast. CONTRAST:  135mL OMNIPAQUE IOHEXOL 350 MG/ML SOLN COMPARISON:  CT chest 08/19/2017 pelvic ultrasound 04/10/2020 FINDINGS: CTA CHEST FINDINGS Cardiovascular: Satisfactory opacification the pulmonary arteries without large central or lobar filling defects. More distal evaluation limited by respiratory motion artifact. Central pulmonary arteries are normal caliber. Normal heart size. No pericardial effusion. Coronary artery calcifications are present. The aortic root is suboptimally assessed given cardiac pulsation artifact. The aorta is normal caliber. No acute luminal abnormality of the imaged aorta. No periaortic stranding or hemorrhage. Normal 3 vessel branching of the aortic arch. Proximal great vessels are unremarkable. No major venous abnormalities. Mediastinum/Nodes: No mediastinal fluid or gas. Normal thyroid gland and thoracic inlet. No acute abnormality of the esophagus. Fairly significant posterior bowing of the trachea and  central airways, greater than is typically expected for imaging during exhalation, could reflect some degree of tracheal bronchomalacia, correlate with clinical features. No worrisome mediastinal, hilar or axillary adenopathy. Lungs/Pleura: Evaluation limited by respiratory motion artifact. Low lung volumes likely accentuated by imaging during exhalation for the angiographic technique. No consolidation, features of edema, pneumothorax, or effusion. Tiny 3 mm nodule in the periphery of the left upper lobe, new from prior (4/28). No other concerning pulmonary nodules or masses. Musculoskeletal: Multilevel degenerative changes are present in the  imaged portions of the spine. No acute osseous abnormality or suspicious osseous lesion. Worrisome chest wall lesions. Review of the MIP images confirms the above findings. CT ABDOMEN and PELVIS FINDINGS Hepatobiliary: Diffuse hepatic hypoattenuation compatible with hepatic steatosis. Sparing along the gallbladder fossa. Smooth liver surface contour. No worrisome focal liver lesions. Normal gallbladder and biliary tree. Pancreas: No pancreatic ductal dilatation or surrounding inflammatory changes. Spleen: Normal in size. No concerning splenic lesions. Adrenals/Urinary Tract: Normal adrenal glands. Kidneys are normally located with symmetric enhancement and excretion. No suspicious renal mass or urolithiasis. Asymmetric right urinary tract dilatation with mild urothelial thickening and periureteral hazy stranding which courses into the immediate vicinity of postsurgical changes present in the deep pelvis and detailed below. No visible obstructive urolithiasis is seen. Some fairly focal posterior bladder wall thickening. No intraluminal gas. Stomach/Bowel: Distal esophagus, stomach and duodenal sweep are unremarkable. No small bowel wall thickening or dilatation. No evidence of obstruction. Appendix is not visualized. No focal inflammation the vicinity of the cecum to suggest an  occult appendicitis. No proximal colonic thickening or dilatation. Rectosigmoid thickening is present in the vicinity of the postsurgical change with some perirectal fat stranding as well. Vascular/Lymphatic: Atherosclerotic calcifications within the abdominal aorta and branch vessels. No aneurysm or ectasia. No enlarged abdominopelvic lymph nodes. Reproductive: Postsurgical changes from recent total abdominal hysterectomy and bilateral salpingo-oophorectomy with some lobular collection of air and fluid about the surgical site some of which is demonstrating some early peripheral rim enhancement which may reflect a developing abscess or collection with associated thickening of the adjacent colon and bladder. Largest component of this collection is present along the posterior bladder wall measuring 3.2 x 5.6 x 5.1 cm in maximal dimensions and an elongated configuration. Poorly discernible fat planes between the rectosigmoid and bladder wall and this collection. Other: Pelvic fluid collection, as above. Additional free fluid layering in the presacral space and pericolic gutters is favored to be redistributed. Some postsurgical changes are seen in the anterior abdominal wall including few foci of subcutaneous gas likely at the site of port placement. Some mild body wall edema as well. Some focal skin thickening and subcutaneous fat stranding along the anterior abdominal wall may also be related to injectable use such as heparin. Musculoskeletal: No acute osseous abnormality or suspicious osseous lesion. Multilevel degenerative changes are present in the imaged portions of the spine. Musculature is normal and symmetric. Review of the MIP images confirms the above findings. IMPRESSION: 1. No central or lobar pulmonary artery emboli. More distal evaluation limited by respiratory motion artifact. 2. Postsurgical changes from recent total abdominal hysterectomy and bilateral salpingo-oophorectomy with a lobular collection of  air and fluid about the surgical site/vaginal cuff some of which is demonstrating some early peripheral rim enhancement which may reflect a developing abscess with associated thickening of the adjacent colon and bladder. Injury of bowel bladder is difficult to fully exclude the absence of luminal contrast media. 3. Asymmetric right urinary tract dilatation with mild urothelial thickening and periureteral hazy stranding which courses into the vicinity of postsurgical changes present in the deep pelvis and collection above. No visible obstructive urolithiasis is seen. Findings could reflect inflammatory stricture secondary to the above process. Furthermore could correlate with urinalysis to exclude concomitant urinary tract infection. 4. New 3 mm nodule in the periphery of the left upper lobe. Consider three to six-month follow-up imaging given patient's history of known malignancy. 5. Fairly significant posterior bowing of the trachea and central airways, greater than is typically  expected for imaging during exhalation, could reflect some degree of tracheobronchomalacia, correlate with clinical features. 6. Hepatic steatosis. These results were called by telephone at the time of interpretation on 06/24/2020 at 12:46 am to provider M[facility] , who verbally acknowledged these results. Electronically Signed   By: Lovena Le M.D.   On: 06/24/2020 00:49   CT ABDOMEN PELVIS W CONTRAST  Result Date: 06/24/2020 CLINICAL DATA:  Abdominal pain, fever post operatively, recent robotic TAH BSO 06/20/2020 EXAM: CT ANGIOGRAPHY CHEST CT ABDOMEN AND PELVIS WITH CONTRAST TECHNIQUE: Multidetector CT imaging of the chest was performed using the standard protocol during bolus administration of intravenous contrast. Multiplanar CT image reconstructions and MIPs were obtained to evaluate the vascular anatomy. Multidetector CT imaging of the abdomen and pelvis was performed using the standard protocol during bolus administration of  intravenous contrast. CONTRAST:  171mL OMNIPAQUE IOHEXOL 350 MG/ML SOLN COMPARISON:  CT chest 08/19/2017 pelvic ultrasound 04/10/2020 FINDINGS: CTA CHEST FINDINGS Cardiovascular: Satisfactory opacification the pulmonary arteries without large central or lobar filling defects. More distal evaluation limited by respiratory motion artifact. Central pulmonary arteries are normal caliber. Normal heart size. No pericardial effusion. Coronary artery calcifications are present. The aortic root is suboptimally assessed given cardiac pulsation artifact. The aorta is normal caliber. No acute luminal abnormality of the imaged aorta. No periaortic stranding or hemorrhage. Normal 3 vessel branching of the aortic arch. Proximal great vessels are unremarkable. No major venous abnormalities. Mediastinum/Nodes: No mediastinal fluid or gas. Normal thyroid gland and thoracic inlet. No acute abnormality of the esophagus. Fairly significant posterior bowing of the trachea and central airways, greater than is typically expected for imaging during exhalation, could reflect some degree of tracheal bronchomalacia, correlate with clinical features. No worrisome mediastinal, hilar or axillary adenopathy. Lungs/Pleura: Evaluation limited by respiratory motion artifact. Low lung volumes likely accentuated by imaging during exhalation for the angiographic technique. No consolidation, features of edema, pneumothorax, or effusion. Tiny 3 mm nodule in the periphery of the left upper lobe, new from prior (4/28). No other concerning pulmonary nodules or masses. Musculoskeletal: Multilevel degenerative changes are present in the imaged portions of the spine. No acute osseous abnormality or suspicious osseous lesion. Worrisome chest wall lesions. Review of the MIP images confirms the above findings. CT ABDOMEN and PELVIS FINDINGS Hepatobiliary: Diffuse hepatic hypoattenuation compatible with hepatic steatosis. Sparing along the gallbladder fossa. Smooth  liver surface contour. No worrisome focal liver lesions. Normal gallbladder and biliary tree. Pancreas: No pancreatic ductal dilatation or surrounding inflammatory changes. Spleen: Normal in size. No concerning splenic lesions. Adrenals/Urinary Tract: Normal adrenal glands. Kidneys are normally located with symmetric enhancement and excretion. No suspicious renal mass or urolithiasis. Asymmetric right urinary tract dilatation with mild urothelial thickening and periureteral hazy stranding which courses into the immediate vicinity of postsurgical changes present in the deep pelvis and detailed below. No visible obstructive urolithiasis is seen. Some fairly focal posterior bladder wall thickening. No intraluminal gas. Stomach/Bowel: Distal esophagus, stomach and duodenal sweep are unremarkable. No small bowel wall thickening or dilatation. No evidence of obstruction. Appendix is not visualized. No focal inflammation the vicinity of the cecum to suggest an occult appendicitis. No proximal colonic thickening or dilatation. Rectosigmoid thickening is present in the vicinity of the postsurgical change with some perirectal fat stranding as well. Vascular/Lymphatic: Atherosclerotic calcifications within the abdominal aorta and branch vessels. No aneurysm or ectasia. No enlarged abdominopelvic lymph nodes. Reproductive: Postsurgical changes from recent total abdominal hysterectomy and bilateral salpingo-oophorectomy with some lobular collection of  air and fluid about the surgical site some of which is demonstrating some early peripheral rim enhancement which may reflect a developing abscess or collection with associated thickening of the adjacent colon and bladder. Largest component of this collection is present along the posterior bladder wall measuring 3.2 x 5.6 x 5.1 cm in maximal dimensions and an elongated configuration. Poorly discernible fat planes between the rectosigmoid and bladder wall and this collection. Other:  Pelvic fluid collection, as above. Additional free fluid layering in the presacral space and pericolic gutters is favored to be redistributed. Some postsurgical changes are seen in the anterior abdominal wall including few foci of subcutaneous gas likely at the site of port placement. Some mild body wall edema as well. Some focal skin thickening and subcutaneous fat stranding along the anterior abdominal wall may also be related to injectable use such as heparin. Musculoskeletal: No acute osseous abnormality or suspicious osseous lesion. Multilevel degenerative changes are present in the imaged portions of the spine. Musculature is normal and symmetric. Review of the MIP images confirms the above findings. IMPRESSION: 1. No central or lobar pulmonary artery emboli. More distal evaluation limited by respiratory motion artifact. 2. Postsurgical changes from recent total abdominal hysterectomy and bilateral salpingo-oophorectomy with a lobular collection of air and fluid about the surgical site/vaginal cuff some of which is demonstrating some early peripheral rim enhancement which may reflect a developing abscess with associated thickening of the adjacent colon and bladder. Injury of bowel bladder is difficult to fully exclude the absence of luminal contrast media. 3. Asymmetric right urinary tract dilatation with mild urothelial thickening and periureteral hazy stranding which courses into the vicinity of postsurgical changes present in the deep pelvis and collection above. No visible obstructive urolithiasis is seen. Findings could reflect inflammatory stricture secondary to the above process. Furthermore could correlate with urinalysis to exclude concomitant urinary tract infection. 4. New 3 mm nodule in the periphery of the left upper lobe. Consider three to six-month follow-up imaging given patient's history of known malignancy. 5. 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 6. Hepatic steatosis. These results were called by telephone at the time of interpretation on 06/24/2020 at 12:46 am to provider [facility] , who verbally acknowledged these results. Electronically Signed   By: Lovena Le M.D.   On: 06/24/2020 00:49   DG Chest Port 1 View  Result Date: 06/23/2020 CLINICAL DATA:  Questionable sepsis EXAM: PORTABLE CHEST 1 VIEW COMPARISON:  CT 08/19/2017, radiograph 04/06/2020 FINDINGS: Accounting for body habitus, the lungs are clear. No consolidation, features of edema, pneumothorax, or effusion. Pulmonary vascularity is normally distributed. The cardiomediastinal contours are unremarkable. No acute osseous or soft tissue abnormality. Telemetry leads overlie the chest. IMPRESSION: No acute cardiopulmonary abnormality. Electronically Signed   By: Lovena Le M.D.   On: 06/23/2020 22:56    Impression/Recommendations  Diabetes mellitus type 2 with hypoglycemia -Last A1c was 8.2 on 06/11/2020.  Normally uses Novolin R 75 units 3 times a day and Novolin and 75 units 3 times a day along with Janumet and [place]. -Currently n.p.o. and blood sugars on the lower side -Continue CBGs with SSI.  If blood sugars start trending upwards, will use long-acting insulin.  Pelvic fluid collection/possible pelvic abscess History of recent robotic hysterectomy, BSO sentinel lymph node biopsy and right ureterolysis on 06/20/2020 for grade 1 endometrial adenocarcinoma -Currently on  IV fluids and Zosyn as per primary GYN team.  Hypertension -Blood pressure currently stable.  Antihypertensives on hold.  Hyperlipidemia -Home meds on hold  Hypothyroidism -Levothyroxine on hold  Thank you for this consultation.  Our [facility] hospitalist team will follow the patient with you.    Aline August M.D. [facility] 06/24/2020, 4:46  PM

## 2020-06-24 NOTE — ED Notes (Signed)
Unable to obtain blood work. This RN called main phlebotomy for lab collection. They were able to get one BMP, but that is all.

## 2020-06-24 NOTE — H&P (Signed)
H&P  Note: Gyn-Onc  CC:  Postop fever, SIRS, pelvic fluid collection.   Assessment:  [name]  is a 64 y.o.  year old with grade 1 endometrial adenocarcinoma in the setting of obesity, type 2 poorly controlled diabetes mellitus, s/p robotic hysterectomy, BSO sentinel lymph node biopsy and right ureterolysis on 06/20/20. Now admitted with SIRS, a pelvic fluid collection and GI symptoms concerning for ileus.  I have a low suspicion on my exam for bowel injury. A possibility for right ureteral injury remains, though her clear urine and the lack of urine extravasation on CT imaging is reassuring.  Her vital signs are much better with resuscitation and IV antibiotics and she no longer appears acutely toxic.  Plan: 1/ admit for observation, volume resuscitation, blood glucose control, IV antibiotics 2/ NPO until non-surgical remedy for her clinical condition is ruled out (ensure no evidence for bowel injury or urologic injury requiring reoperating). 3/ Insulin sliding scale for now, however, will contact her endocrinologist to advise regarding Rx of insulin while NPO and active infection 4/ IV zosyn for presumed pelvic abscess/SIRS. Will broaden coverage if she continues to spike fevers after 48 hours. If she remains afebrile with normal WBC in 48 hours, will convert to oral antibiotics. Will discuss possibility of drainage with interventional radiology.   HPI: [name] is a 64 year old P2 who was seen in consultation at the request of Dr Benjie Karvonen for evaluation of grade 1 endometrioid endometrial adenocarcinoma.  The patient reported a 1 year history of postmenopausal bleeding. She attributed this bleeding to having a retained IUD. She did not have an OBGYN provider and so was not being evaluated. She reported only seeing her endocrinologist for surveillance of her diabetes on a regular basis. She has a PCP, however does not see that provider at regularly scheduled visits. She brought  attention to the symptom of postmenopausal spotting to her endocrinologist on 01/19/20 who recommended OBGYN work up.  The patient saw Dr Benjie Karvonen on January 29, 2020 and a Pap smear was performed which was cytologically normal and negative for high-risk HPV.  Additionally she underwent ultrasound evaluation of the endometrium at that date which revealed a uterus of normal size, the endometrium is heterogeneous and thickened with vascularity and measured 27.3 mm.  A Lippe's Loop IUD was noted.  It was within the endometrial cavity.  There were normal ovaries and no adnexal masses.  She declined endometrial biopsy on that same day and returned at a separate visit on February 14, 2020 for endometrial sampling with a Pipelle biopsy.  This revealed FIGO grade 1 endometrioid adenocarcinoma.  The patient reported a past history of type 2 diabetes mellitus for which she takes insulin.  She is managed by an endocrinologist for this whom she sees regularly.  Her last documented HbA1c was 13% in January 2021.  At that time she was regularly having fasting blood glucoses in the 400-500 range.  Alterations were made in her insulin regimen and the patient began observing fasting blood glucoses in the 200s.  She rarely has blood glucoses less than 200 even in the fasting timeframe.  The patient's diabetes mellitus is complicated by peripheral neuropathy, particular in her feet.  She has some renal dysfunction though is unclear of the exact degree or nature of this.  She denies retinopathy or infectious complications.  Her surgery was initially delayed due to very poor diabetes control (HbAc1 of 10.7%). She was prescribed megace which facilitated increased food  consumption due to appetite stimulation.  Her HbA1c was rechecked in September and had increased to 11.1%. However, her endocrinologist prescribed a freestyle libre continuous blood glucose monitoring system and with this she has stopped having spikes in blood glucose that  were too high to read (>600). According to the patient, these were happening regularly before September, 2021.  She has no further bleeding on Megace.  Repeat HbA1c on 06/11/20 was improved at 8.2%.  On 06/20/20 she underwent robotic assisted total hysterectomy, BSO, SLN biopsy with right ureterolysis. Intraoperative findings were significant for extreme intraperitoneal and retroperitoneal adiposity. 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 Right ureterolysis was required. Surgery was uncomplicated.   She was kept overnight in the hospital for blood glucose management and did very well postop and was discharged with vital signs on POD 1.  Interval Hx:  The patient reported that on the night of POD 1 she developed nausea and emesis. This improved on POD 2, but became worse again on POD 3 with associated low blood glucose. She had a poor appetite. She had fevers and chills on the night of POD 3 and called an ambulance and was brought in to the hospital.  In the ED she was noted to be tachycardic, hypotensive and febrile to 101. Her WBC was normal, her creatinine was only mildly elevated. She was empirically started on broad spectrum Abx according to sepsis protocol and a CT abd/pelvis was performed on 06/23/20 which showed no free air, mild right hydronephrosis, no obvious extravasation of IV contrast, and a 3x5cm fluid collection adjacent to the vaginal cuff. After receipt of IV fluids and antibiotics, her vitals quickly normalized.   CBC    Component Value Date/Time   WBC 8.3 06/23/2020 2147   RBC 3.61 (L) 06/23/2020 2147   HGB 10.7 (L) 06/23/2020 2147   HCT 31.9 (L) 06/23/2020 2147   PLT 191 06/23/2020 2147   MCV 88.4 06/23/2020 2147   MCH 29.6 06/23/2020 2147   MCHC 33.5 06/23/2020 2147   RDW 13.5 06/23/2020 2147   LYMPHSABS 0.7 06/23/2020 2147   MONOABS 0.1 06/23/2020 2147   EOSABS 0.0 06/23/2020 2147   BASOSABS 0.0 06/23/2020 2147   BMET    Component  Value Date/Time   NA 135 06/23/2020 2147   K 3.5 06/23/2020 2147   CL 107 06/23/2020 2147   CO2 18 (L) 06/23/2020 2147   GLUCOSE 160 (H) 06/23/2020 2147   BUN 18 06/23/2020 2147   CREATININE 1.18 (H) 06/23/2020 2147   CALCIUM 8.7 (L) 06/23/2020 2147   GFRNONAA 52 (L) 06/23/2020 2147   GFRAA 53 (L) 02/23/2020 1313    Current Meds:  Outpatient Encounter Medications as of 05/06/2020  Medication Sig  . amitriptyline (ELAVIL) 25 MG tablet Take 25 mg by mouth at bedtime.  Marland Kitchen atorvastatin (LIPITOR) 40 MG tablet Take 1 tablet (40 mg total) by mouth daily at 6 PM.  . benzonatate (TESSALON) 100 MG capsule Take 1 capsule (100 mg total) by mouth every 8 (eight) hours. (Patient taking differently: Take 100 mg by mouth 3 (three) times daily as needed. )  . Fluticasone-Salmeterol (ADVAIR DISKUS) 250-50 MCG/DOSE AEPB Inhale 1 puff into the lungs in the morning and at bedtime.  Marland Kitchen losartan-hydrochlorothiazide (HYZAAR) 100-25 MG tablet Take 1 tablet by mouth daily.  . megestrol (MEGACE) 40 MG tablet Take 2 tablets (80 mg total) by mouth 2 (two) times daily.  . metFORMIN (GLUCOPHAGE-XR) 500 MG 24 hr tablet Take 500 mg by  mouth 2 (two) times daily.   Marland Kitchen NOVOLIN N FLEXPEN 100 UNIT/ML Kiwkpen Inject 75 Units into the skin in the morning, at noon, and at bedtime.   Marland Kitchen NOVOLIN R FLEXPEN 100 UNIT/ML SOPN Inject 75 Units into the skin in the morning, at noon, and at bedtime.   Glory Rosebush ULTRA test strip 1 each 3 (three) times daily.  . pregabalin (LYRICA) 150 MG capsule Take 150 mg by mouth 3 (three) times daily.   . insulin aspart protamine- aspart (NOVOLOG MIX 70/30) (70-30) 100 UNIT/ML injection Inject 50 Units into the skin in the morning, at noon, and at bedtime. (Patient not taking: Reported on 05/06/2020)  . NOVOLIN 70/30 FLEXPEN (70-30) 100 UNIT/ML KwikPen SMARTSIG:50 Unit(s) SUB-Q 3 Times Daily (Patient not taking: Reported on 05/06/2020)  . [DISCONTINUED] aspirin EC 81 MG EC tablet Take 1 tablet (81 mg total)  by mouth daily.  . [DISCONTINUED] SITagliptin-metFORMIN HCl (JANUMET PO) Take by mouth.   No facility-administered encounter medications on file as of 05/06/2020.    Allergy:  Allergies  Allergen Reactions  . Byetta 10 Mcg Pen [Exenatide] Itching  . Januvia [Sitagliptin]     Yeast infections   . Invokana [Canagliflozin] Other (See Comments)    Yeast infections    Social Hx:   Social History   Socioeconomic History  . Marital status: Married    Spouse name: Not on file  . Number of children: Not on file  . Years of education: Not on file  . Highest education level: Not on file  Occupational History  . Not on file  Tobacco Use  . Smoking status: Never Smoker  . Smokeless tobacco: Never Used  Vaping Use  . Vaping Use: Never used  Substance and Sexual Activity  . Alcohol use: No  . Drug use: No  . Sexual activity: Not on file  Other Topics Concern  . Not on file  Social History Narrative   Lives home with spouse.  Education Masters Degree.  Works at [employer].  2 Children.   Caffeine 1 cup coffee daily.    Social Determinants of Health   Financial Resource Strain:   . Difficulty of Paying Living Expenses: Not on file  Food Insecurity:   . Worried About Charity fundraiser in the Last Year: Not on file  . Ran Out of Food in the Last Year: Not on file  Transportation Needs:   . Lack of Transportation (Medical): Not on file  . Lack of Transportation (Non-Medical): Not on file  Physical Activity:   . Days of Exercise per Week: Not on file  . Minutes of Exercise per Session: Not on file  Stress:   . Feeling of Stress : Not on file  Social Connections:   . Frequency of Communication with Friends and Family: Not on file  . Frequency of Social Gatherings with Friends and Family: Not on file  . Attends Religious Services: Not on file  . Active Member of Clubs or Organizations: Not on file  . Attends Archivist Meetings: Not on file  . Marital Status: Not on file   Intimate Partner Violence:   . Fear of Current or Ex-Partner: Not on file  . Emotionally Abused: Not on file  . Physically Abused: Not on file  . Sexually Abused: Not on file    Past Surgical Hx:  Past Surgical History:  Procedure Laterality Date  . HAND LIGAMENT RECONSTRUCTION    . ROBOTIC ASSISTED TOTAL HYSTERECTOMY WITH BILATERAL  SALPINGO OOPHERECTOMY N/A 06/20/2020   Procedure: XI ROBOTIC ASSISTED TOTAL HYSTERECTOMY WITH BILATERAL SALPINGO OOPHORECTOMY, RIGHT URETERAL LYSIS;  Surgeon: [name], MD;  Location: [facility];  Service: Gynecology;  Laterality: N/A;  . SENTINEL NODE BIOPSY N/A 06/20/2020   Procedure: SENTINEL NODE BIOPSY;  Surgeon: [name], MD;  Location: [facility];  Service: Gynecology;  Laterality: N/A;    Past Medical Hx:  Past Medical History:  Diagnosis Date  . Cancer ([place])    endometreal ut  . Diabetes mellitus   . GERD (gastroesophageal reflux disease)   . Hypercholesterolemia   . Hypertension   . Neuropathy of both feet   . Plantar fasciitis    Left foot    Past Gynecological History:  See HPI No LMP recorded. Patient is postmenopausal.  Family Hx:  Family History  Problem Relation Age of Onset  . Diabetes Mellitus II Mother   . Stroke Mother   . Hypertension Mother   . Stroke Brother   . Diabetes Mellitus II Brother   . Hypertension Brother   . Cancer Paternal Aunt        ovarian    Review of Systems:  Constitutional  Feels generally unwell, + fevers and chills    ENT Normal appearing ears and nares bilaterally Skin/Breast  No rash, sores, jaundice, itching, dryness Cardiovascular  No chest pain, shortness of breath, or edema  Pulmonary  No cough or wheeze.  Gastro Intestinal  + nausea and emesis; + flatus.  Genito Urinary  No frequency, urgency, dysuria, no bleeding Musculo Skeletal  No myalgia, arthralgia, joint swelling or pain  Neurologic  No weakness, numbness, change in gait,  Psychology  No depression, anxiety,  insomnia.   Vitals:  Blood pressure 139/70, pulse (!) 102, temperature 98.3 F (36.8 C), temperature source Oral, resp. rate (!) 24, height 5' (1.524 m), weight 193 lb 2 oz (87.6 kg), SpO2 97 %.  Physical Exam: General In no apparent distress, No cervical supraclavicular or inguinal adenopathy Cardiovascular  Well perfused peripheries, RRR on telemetry. No tachycardia at present.  Lungs  No increased WOB - breathing comfortably. Skin  No rash/lesions/breakdown  Psychiatry  Alert and oriented to person, place, and time  Abdomen  Distended (but stable from preoperative evaluation). Minimally tender and very obese without evidence of hernia. No rebound or guarding. Resonant to percussion.  Back No CVA tenderness Genito Urinary  Vaginal cuff without gross separation on palpation (limited exam due to body habitus and presence of patient on ER bed) and no palpable dehiscence or drainage or blood. Urine in foley was clear.  Rectal  deferred.  Extremities  Stable edema   Thereasa Solo, MD  06/24/2020, 8:00 AM

## 2020-06-24 NOTE — Progress Notes (Signed)
Responded to consult for IV. Attempt x1. During attempt, pt requested to stop and stated, "I can't do this tonight." Declines further assessment/attempt at obtaining IV. RN made aware. Consult cleared.

## 2020-06-24 NOTE — ED Notes (Signed)
Dr. Denman George notified of pt BGL of 68. D50 12.5g as verbal order

## 2020-06-25 DIAGNOSIS — E1169 Type 2 diabetes mellitus with other specified complication: Secondary | ICD-10-CM

## 2020-06-25 DIAGNOSIS — R188 Other ascites: Secondary | ICD-10-CM

## 2020-06-25 DIAGNOSIS — G629 Polyneuropathy, unspecified: Secondary | ICD-10-CM | POA: Diagnosis not present

## 2020-06-25 DIAGNOSIS — E785 Hyperlipidemia, unspecified: Secondary | ICD-10-CM

## 2020-06-25 DIAGNOSIS — K219 Gastro-esophageal reflux disease without esophagitis: Secondary | ICD-10-CM | POA: Diagnosis not present

## 2020-06-25 DIAGNOSIS — E1165 Type 2 diabetes mellitus with hyperglycemia: Secondary | ICD-10-CM | POA: Diagnosis not present

## 2020-06-25 DIAGNOSIS — E039 Hypothyroidism, unspecified: Secondary | ICD-10-CM | POA: Diagnosis not present

## 2020-06-25 LAB — BLOOD CULTURE ID PANEL (REFLEXED) - BCID2

## 2020-06-25 LAB — CBC WITH DIFFERENTIAL/PLATELET
Abs Immature Granulocytes: 0.04 10*3/uL (ref 0.00–0.07)
Basophils Absolute: 0 10*3/uL (ref 0.0–0.1)
Basophils Relative: 0 %
Eosinophils Absolute: 0 10*3/uL (ref 0.0–0.5)
Eosinophils Relative: 0 %
HCT: 26.8 % — ABNORMAL LOW (ref 36.0–46.0)
Hemoglobin: 8.8 g/dL — ABNORMAL LOW (ref 12.0–15.0)
Immature Granulocytes: 0 %
Lymphocytes Relative: 10 %
Lymphs Abs: 1.1 10*3/uL (ref 0.7–4.0)
MCH: 30 pg (ref 26.0–34.0)
MCHC: 32.8 g/dL (ref 30.0–36.0)
MCV: 91.5 fL (ref 80.0–100.0)
Monocytes Absolute: 0.4 10*3/uL (ref 0.1–1.0)
Monocytes Relative: 4 %
Neutro Abs: 9.3 10*3/uL — ABNORMAL HIGH (ref 1.7–7.7)
Neutrophils Relative %: 86 %
Platelets: 173 10*3/uL (ref 150–400)
RBC: 2.93 MIL/uL — ABNORMAL LOW (ref 3.87–5.11)
RDW: 13.8 % (ref 11.5–15.5)
WBC: 10.9 10*3/uL — ABNORMAL HIGH (ref 4.0–10.5)
nRBC: 0 % (ref 0.0–0.2)

## 2020-06-25 LAB — URINE CULTURE: Culture: NO GROWTH

## 2020-06-25 LAB — BASIC METABOLIC PANEL
Anion gap: 11 (ref 5–15)
BUN: 19 mg/dL (ref 8–23)
CO2: 19 mmol/L — ABNORMAL LOW (ref 22–32)
Calcium: 9 mg/dL (ref 8.9–10.3)
Chloride: 108 mmol/L (ref 98–111)
Creatinine, Ser: 1.15 mg/dL — ABNORMAL HIGH (ref 0.44–1.00)
GFR, Estimated: 54 mL/min — ABNORMAL LOW (ref >60)
Glucose, Bld: 117 mg/dL — ABNORMAL HIGH (ref 70–99)
Potassium: 3.5 mmol/L (ref 3.5–5.1)
Sodium: 138 mmol/L (ref 135–145)

## 2020-06-25 LAB — GLUCOSE, CAPILLARY
Glucose-Capillary: 104 mg/dL — ABNORMAL HIGH (ref 70–99)
Glucose-Capillary: 105 mg/dL — ABNORMAL HIGH (ref 70–99)
Glucose-Capillary: 107 mg/dL — ABNORMAL HIGH (ref 70–99)
Glucose-Capillary: 110 mg/dL — ABNORMAL HIGH (ref 70–99)
Glucose-Capillary: 116 mg/dL — ABNORMAL HIGH (ref 70–99)
Glucose-Capillary: 126 mg/dL — ABNORMAL HIGH (ref 70–99)
Glucose-Capillary: 98 mg/dL (ref 70–99)

## 2020-06-25 LAB — SURGICAL PATHOLOGY

## 2020-06-25 LAB — C DIFFICILE QUICK SCREEN W PCR REFLEX
C Diff antigen: NEGATIVE
C Diff interpretation: NOT DETECTED
C Diff toxin: NEGATIVE

## 2020-06-25 MED ORDER — INSULIN ASPART 100 UNIT/ML ~~LOC~~ SOLN: 0.0000 [IU] | Freq: Four times a day (QID) | SUBCUTANEOUS | Status: DC

## 2020-06-25 MED ORDER — INSULIN ASPART 100 UNIT/ML ~~LOC~~ SOLN
0.0000 [IU] | Freq: Four times a day (QID) | SUBCUTANEOUS | Status: DC
  Administered 2020-06-25: 13:00:00 3 [IU] via SUBCUTANEOUS

## 2020-06-25 MED ORDER — PANTOPRAZOLE SODIUM 40 MG PO TBEC
40.0000 mg | DELAYED_RELEASE_TABLET | Freq: Every day | ORAL | Status: DC
  Administered 2020-06-25 – 2020-06-28 (×4): 40 mg via ORAL
  Filled 2020-06-25 (×4): qty 1

## 2020-06-25 MED ORDER — LEVOTHYROXINE SODIUM 100 MCG/5ML IV SOLN: 25.0000 ug | Freq: Every day | INTRAVENOUS | Status: DC

## 2020-06-25 MED ORDER — INSULIN ASPART 100 UNIT/ML ~~LOC~~ SOLN
0.0000 [IU] | Freq: Three times a day (TID) | SUBCUTANEOUS | Status: DC
  Administered 2020-06-26 – 2020-06-27 (×2): 3 [IU] via SUBCUTANEOUS
  Administered 2020-06-27: 13:00:00 4 [IU] via SUBCUTANEOUS
  Administered 2020-06-27: 18:00:00 11 [IU] via SUBCUTANEOUS
  Administered 2020-06-28: 3 [IU] via SUBCUTANEOUS
  Administered 2020-06-28: 13:00:00 7 [IU] via SUBCUTANEOUS

## 2020-06-25 NOTE — Progress Notes (Signed)
Inpatient Diabetes Program Recommendations  AACE/ADA: New Consensus Statement on Inpatient Glycemic Control (2015)  Target Ranges:  Prepandial:   less than 140 mg/dL      Peak postprandial:   less than 180 mg/dL (1-2 hours)      Critically ill patients:  140 - 180 mg/dL   Lab Results  Component Value Date   GLUCAP 126 (H) 06/25/2020   HGBA1C 8.2 (H) 06/11/2020    Review of Glycemic Control  Diabetes history: DM2 Outpatient Diabetes medications: Novolin [id number] tidwc, Janumet 50/1000mg  bid, Farxiga 10 mg QD Current orders for Inpatient glycemic control: Novolog 0-20 units Q6H  HgbA1C - 8.2%  Inpatient Diabetes Program Recommendations:     Continue Novolog 0-20 units Q6H. If FBS > 180 mg/dL, add Lantus 15 units Q24H When diet is advanced, will likely need meal coverage insulin (would begin with Novolog 5 units tidwc)  Spoke with pt and sister at bedside. Pt c/o being thirsty and wanting to drink something. Explained NPO status. RN to inquire about icechips for pt. Verified home dose of Novolin N and R tid. Endo is [place]. States she occasionally has a low at home, but blood sugars usually pretty good. Discussed HgbA1C of 8.2%.  Will follow glucose trends daily.  Thank you. Lorenda Peck, RD, LDN, CDE Inpatient Diabetes Coordinator [phone]

## 2020-06-25 NOTE — Progress Notes (Signed)
06/24/20 2137  Assess: MEWS Score  Temp (!) 101.6 F (38.7 C)  BP (!) 144/69  Pulse Rate (!) 110  Resp (!) 21  SpO2 100 %  O2 Device Room Air  Assess: MEWS Score  MEWS Temp 2  MEWS Systolic 0  MEWS Pulse 1  MEWS RR 1  MEWS LOC 0  MEWS Score 4  MEWS Score Color Red  Assess: if the MEWS score is Yellow or Red  Were vital signs taken at a resting state? Yes  Focused Assessment Change from prior assessment (see assessment flowsheet)  Early Detection of Sepsis Score *See Row Information* High  MEWS guidelines implemented *See Row Information* No, previously red, continue vital signs every 4 hours  Treat  Pain Scale 0-10  Pain Score 8  Pain Type Acute pain  Pain Location Abdomen  Pain Frequency Constant  Pain Onset Progressive  Patients Stated Pain Goal 4  Pain Intervention(s) Medication (See eMAR)  Multiple Pain Sites No  Take Vital Signs  Increase Vital Sign Frequency  Red: Q 1hr X 4 then Q 4hr X 4, if remains red, continue Q 4hrs  Escalate  MEWS: Escalate Red: discuss with charge nurse/RN and provider, consider discussing with RRT  Notify: Charge Nurse/RN  Name of Charge Nurse/RN Notified Bishnu, R  Date Charge Nurse/RN Notified 06/24/20  Time Charge Nurse/RN Notified 2200  Notify: Provider  Provider Name/Title [facility]  Date Provider Notified 06/24/20  Time Provider Notified 2200  Notification Type Page  Notification Reason Change in status  Response See new orders  Date of Provider Response 06/24/20  Time of Provider Response 2201  Document  Patient Outcome Stabilized after interventions  Progress note created (see row info) Yes     06/24/20 2137  Assess: MEWS Score  Temp (!) 101.6 F (38.7 C)  BP (!) 144/69  Pulse Rate (!) 110  Resp (!) 21  SpO2 100 %  O2 Device Room Air  Assess: MEWS Score  MEWS Temp 2  MEWS Systolic 0  MEWS Pulse 1  MEWS RR 1  MEWS LOC 0  MEWS Score 4  MEWS Score Color Red  Assess: if the MEWS score is Yellow or Red  Were  vital signs taken at a resting state? Yes  Focused Assessment Change from prior assessment (see assessment flowsheet)  Early Detection of Sepsis Score *See Row Information* High  MEWS guidelines implemented *See Row Information* No, previously red, continue vital signs every 4 hours  Treat  Pain Scale 0-10  Pain Score 8  Pain Type Acute pain  Pain Location Abdomen  Pain Frequency Constant  Pain Onset Progressive  Patients Stated Pain Goal 4  Pain Intervention(s) Medication (See eMAR)  Multiple Pain Sites No  Take Vital Signs  Increase Vital Sign Frequency  Red: Q 1hr X 4 then Q 4hr X 4, if remains red, continue Q 4hrs  Escalate  MEWS: Escalate Red: discuss with charge nurse/RN and provider, consider discussing with RRT  Notify: Charge Nurse/RN  Name of Charge Nurse/RN Notified Bishnu, R  Date Charge Nurse/RN Notified 06/24/20  Time Charge Nurse/RN Notified 2200  Notify: Provider  Provider Name/Title [facility]  Date Provider Notified 06/24/20  Time Provider Notified 2200  Notification Type Page  Notification Reason Change in status  Response See new orders  Date of Provider Response 06/24/20  Time of Provider Response 2201  Document  Patient Outcome Stabilized after interventions  Progress note created (see row info) Yes    Mews changed to  Red d/t increase temperature and HR, pt is alert, no signs of distress noted, provider on call paged with new order carried out; at 2337 temperature went down to 98.8; will continue to monitor pt.

## 2020-06-25 NOTE — Progress Notes (Signed)
Consult/PROGRESS NOTE    Christy Carpenter  PPJ:[id number] DOB: 28-Jun-1956 DOA: 06/23/2020 PCP: [name], MD    Chief Complaint  Patient presents with  . 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  Hospitalist service was consulted to help with diabetes management.  Patient just received IV Dilaudid and is slightly drowsy and a poor historian at time of hospitalist evaluation.  She denies current nausea or vomiting.  States that her pain is well controlled at this time.  She has had fevers and chills along with some nausea and vomiting over the last few days.   Assessment & Plan:   Principal Problem:   SIRS due to infectious process with acute organ dysfunction [facility]) Active Problems:   Uncontrolled type 2 diabetes mellitus with hyperglycemia (HCC)   Endometrial cancer (HCC)   Obesity (BMI 30.0-34.9)   SIRS (systemic inflammatory response syndrome) (HCC)   Hypothyroidism   Gastroesophageal reflux disease   Neuropathy   Pelvic fluid collection   Type 2 diabetes mellitus with hyperlipidemia (HCC)  1 diabetes mellitus type 2  Hemoglobin A1c 8.2 (06/11/2020).  Patient normally uses Novolin R 75 units 3 times a day, Novolin 75 units 3 times a day with Janumet and [place].  Patient noted to be n.p.o. and being advanced to a carb modified diet per primary service.  Will change CBGs to before meals and at bedtime.  Continue sliding scale insulin for now.  As oral intake improves may need to start long-acting insulin if CBGs start to get elevated.  Consult with diabetic coordinator.  2.  Pelvic fluid collection/possible pelvic  abscess Status post recent robotic hysterectomy, BSO sentinel lymph node biopsy and right ureterolysis on 06/20/2020 for grade 1 endometrial adenocarcinoma.  Patient currently on IV fluids IV Zosyn. Per primary GYN team.  3.  Hypothyroidism Place back on home dose IV Synthroid.  Once tolerating oral intake will transition back to home dose oral Synthroid.  Follow.  4.  Hyperlipidemia Continue to hold statin, fenofibrate.  Resume on discharge.  5.  Gastroesophageal reflux disease PPI.  6.  Neuropathy of both feet Once resumed on oral intake will resume home regimen Lyrica.   DVT prophylaxis: Heparin Code Status: Full Family Communication: Updated patient and sister at bedside. Disposition:   Status is: Inpatient    Dispo: The patient is from: Home              Anticipated d/c is to: Home              Anticipated d/c date is: Per primary team              Patient currently with significant abdominal pain, being managed by primary team.  Disposition per primary team.       Consultants:   [facility] hospitalist: Dr. Starla Link 06/24/2020  Procedures:   CT abdomen and pelvis 06/24/2020  CT angiogram chest 06/24/2020  Chest x-ray 06/23/2020    Antimicrobials:   IV cefepime 11/7/2021x1 dose  IV vancomycin 11/7/2021x1 dose  IV Zosyn 06/24/2020 >>>>>  IV Flagyl 11/7/2021x1 dose   Subjective: Patient laying in bed.  Complains of significant abdominal pain.  Lab at bedside to try to draw labs  as patient refused this morning as she was a difficult stick.  No chest pain.  No shortness of breath.  Objective: Vitals:   06/25/20 0137 06/25/20 0537 06/25/20 0937 06/25/20 1332  BP: 106/64 111/66 122/64 115/68  Pulse: 93 (!) 101 90 96  Resp: (!) 23 (!) 23 19 18   Temp: 98.9 F (37.2 C) 99.6 F (37.6 C) 98.9 F (37.2 C) 98.7 F (37.1 C)  TempSrc: Oral Oral Oral Oral  SpO2: 96% 97% 99% 98%  Weight:      Height:        Intake/Output Summary (Last 24 hours) at 06/25/2020  1623 Last data filed at 06/25/2020 1000 Gross per 24 hour  Intake 1125.96 ml  Output 600 ml  Net 525.96 ml   Filed Weights   06/23/20 2145  Weight: 87.6 kg    Examination:  General exam: Appears calm and comfortable  Respiratory system: Clear to auscultation. Respiratory effort normal. Cardiovascular system: S1 & S2 heard, RRR. No JVD, murmurs, rubs, gallops or clicks. No pedal edema. Gastrointestinal system: Abdomen is mildly distended, soft, positive bowel sounds, diffusely tender to palpation.  Central nervous system: Alert and oriented. No focal neurological deficits. Extremities: Symmetric 5 x 5 power. Skin: No rashes, lesions or ulcers Psychiatry: Judgement and insight appear normal. Mood & affect appropriate.     Data Reviewed: I have personally reviewed following labs and imaging studies  CBC: Recent Labs  Lab 06/21/20 0504 06/23/20 2147 06/24/20 1654 06/25/20 1118  WBC 9.4 8.3 12.6* 10.9*  NEUTROABS  --  7.4 10.5* 9.3*  HGB 10.5* 10.7* 9.9* 8.8*  HCT 30.9* 31.9* 30.2* 26.8*  MCV 89.3 88.4 92.4 91.5  PLT 199 191 178 725    Basic Metabolic Panel: Recent Labs  Lab 06/21/20 0504 06/23/20 2147 06/24/20 0800 06/25/20 1118  NA 135 135 136 138  K 4.0 3.5 3.8 3.5  CL 103 107 105 108  CO2 21* 18* 19* 19*  GLUCOSE 191* 160* 89 117*  BUN 18 18 19 19   CREATININE 1.04* 1.18* 1.17* 1.15*  CALCIUM 8.6* 8.7* 8.7* 9.0    GFR: Estimated Creatinine Clearance: 49.9 mL/min (A) (by C-G formula based on SCr of 1.15 mg/dL (H)).  Liver Function Tests: Recent Labs  Lab 06/23/20 2147  AST 25  ALT 24  ALKPHOS 118  BILITOT 0.8  PROT 7.5  ALBUMIN 3.5    CBG: Recent Labs  Lab 06/25/20 0056 06/25/20 0501 06/25/20 0756 06/25/20 0955 06/25/20 1216  GLUCAP 98 105* 104* 116* 126*     Recent Results (from the past 240 hour(s))  SARS CORONAVIRUS 2 (TAT 6-24 HRS) Nasopharyngeal Nasopharyngeal Swab     Status: None   Collection Time: 06/17/20  1:16 PM    Specimen: Nasopharyngeal Swab  Result Value Ref Range Status   SARS Coronavirus 2 NEGATIVE NEGATIVE Final    Comment: (NOTE) SARS-CoV-2 target nucleic acids are NOT DETECTED.  The SARS-CoV-2 RNA is generally detectable in upper and lower respiratory specimens during the acute phase of infection. Negative results do not preclude SARS-CoV-2 infection, do not rule out co-infections with other pathogens, and should not be used as the sole basis for treatment or other patient management decisions. Negative results must be combined with clinical observations, patient history, and epidemiological information. The expected result is Negative.  Fact Sheet for Patients: SugarRoll.be  Fact Sheet for Healthcare Providers: https://www.woods-mathews.com/  This test is not yet approved or cleared by the [place] FDA and  has been authorized for  detection and/or diagnosis of SARS-CoV-2 by FDA under an Emergency Use Authorization (EUA). This EUA will remain  in effect (meaning this test can be used) for the duration of the COVID-19 declaration under Se ction 564(b)(1) of the Act, 21 U.S.C. section 360bbb-3(b)(1), unless the authorization is terminated or revoked sooner.  Performed at [facility], [place] [address] [place], [place] [address]   Blood culture (routine x 2)     Status: None (Preliminary result)   Collection Time: 06/23/20  9:47 PM   Specimen: BLOOD  Result Value Ref Range Status   Specimen Description   Final    BLOOD LEFT ANTECUBITAL Performed at [facility] [address], [place], [place] [address]    Special Requests   Final    BOTTLES DRAWN AEROBIC AND ANAEROBIC Blood Culture adequate volume Performed at [facility] [address], [place], [place] [address]    Culture   Final    NO GROWTH 2 DAYS Performed at [facility] [address], [place], [place] [address]     Report Status PENDING  Incomplete  Respiratory Panel by RT PCR (Flu A&B, Covid) - Nasopharyngeal Swab     Status: None   Collection Time: 06/23/20  9:54 PM   Specimen: Nasopharyngeal Swab  Result Value Ref Range Status   SARS Coronavirus 2 by RT PCR NEGATIVE NEGATIVE Final    Comment: (NOTE) SARS-CoV-2 target nucleic acids are NOT DETECTED.  The SARS-CoV-2 RNA is generally detectable in upper respiratoy specimens during the acute phase of infection. The lowest concentration of SARS-CoV-2 viral copies this assay can detect is 131 copies/mL. A negative result does not preclude SARS-Cov-2 infection and should not be used as the sole basis for treatment or other patient management decisions. A negative result may occur with  improper specimen collection/handling, submission of specimen other than nasopharyngeal swab, presence of viral mutation(s) within the areas targeted by this assay, and inadequate number of viral copies (<131 copies/mL). A negative result must be combined with clinical observations, patient history, and epidemiological information. The expected result is Negative.  Fact Sheet for Patients:  PinkCheek.be  Fact Sheet for Healthcare Providers:  GravelBags.it  This test is no t yet approved or cleared by the [place] FDA and  has been authorized for detection and/or diagnosis of SARS-CoV-2 by FDA under an Emergency Use Authorization (EUA). This EUA will remain  in effect (meaning this test can be used) for the duration of the COVID-19 declaration under Section 564(b)(1) of the Act, 21 U.S.C. section 360bbb-3(b)(1), unless the authorization is terminated or revoked sooner.     Influenza A by PCR NEGATIVE NEGATIVE Final   Influenza B by PCR NEGATIVE NEGATIVE Final    Comment: (NOTE) The Xpert Xpress SARS-CoV-2/FLU/RSV assay is intended as an aid in  the diagnosis of influenza from Nasopharyngeal swab  specimens and  should not be used as a sole basis for treatment. Nasal washings and  aspirates are unacceptable for Xpert Xpress SARS-CoV-2/FLU/RSV  testing.  Fact Sheet for Patients: PinkCheek.be  Fact Sheet for Healthcare Providers: GravelBags.it  This test is not yet approved or cleared by the [place] FDA and  has been authorized for detection and/or diagnosis of SARS-CoV-2 by  FDA under an Emergency Use Authorization (EUA). This EUA will remain  in effect (meaning this test can be used) for the duration of the  Covid-19 declaration under Section 564(b)(1) of the Act, 21  U.S.C. section  360bbb-3(b)(1), unless the authorization is  terminated or revoked. Performed at [facility], [place] [address] [place], [place] [address]   Blood culture (routine x 2)     Status: None (Preliminary result)   Collection Time: 06/23/20 10:36 PM   Specimen: BLOOD  Result Value Ref Range Status   Specimen Description   Final    BLOOD LEFT ANTECUBITAL Performed at [facility] [address], [place], [place] [address]    Special Requests   Final    BOTTLES DRAWN AEROBIC AND ANAEROBIC Blood Culture adequate volume Performed at [facility] [address] [place], [place] [address]    Culture  Setup Time   Final    GRAM NEGATIVE RODS ANAEROBIC BOTTLE ONLY Organism ID to follow CRITICAL RESULT CALLED TO, READ BACK BY AND VERIFIED WITH: Melodye Ped PharmD 10:55 06/25/20 ([email or web address])    Culture   Final    NO GROWTH 2 DAYS Performed at [facility], [address] [place], [place] [address]    Report Status PENDING  Incomplete  Blood Culture ID Panel (Reflexed)     Status: Abnormal   Collection Time: 06/23/20 10:36 PM  Result Value Ref Range Status   Enterococcus faecalis NOT DETECTED NOT DETECTED Final   Enterococcus Faecium NOT DETECTED NOT DETECTED Final   Listeria  monocytogenes NOT DETECTED NOT DETECTED Final   Staphylococcus species NOT DETECTED NOT DETECTED Final   Staphylococcus aureus (BCID) NOT DETECTED NOT DETECTED Final   Staphylococcus epidermidis NOT DETECTED NOT DETECTED Final   Staphylococcus lugdunensis NOT DETECTED NOT DETECTED Final   Streptococcus species NOT DETECTED NOT DETECTED Final   Streptococcus agalactiae NOT DETECTED NOT DETECTED Final   Streptococcus pneumoniae NOT DETECTED NOT DETECTED Final   Streptococcus pyogenes NOT DETECTED NOT DETECTED Final   A.calcoaceticus-baumannii NOT DETECTED NOT DETECTED Final   Bacteroides fragilis DETECTED (A) NOT DETECTED Final    Comment: CRITICAL RESULT CALLED TO, READ BACK BY AND VERIFIED WITH: Melodye Ped PharmD 10:55 06/25/20 ([email or web address])    Enterobacterales NOT DETECTED NOT DETECTED Final   Enterobacter cloacae complex NOT DETECTED NOT DETECTED Final   Escherichia coli NOT DETECTED NOT DETECTED Final   Klebsiella aerogenes NOT DETECTED NOT DETECTED Final   Klebsiella oxytoca NOT DETECTED NOT DETECTED Final   Klebsiella pneumoniae NOT DETECTED NOT DETECTED Final   Proteus species NOT DETECTED NOT DETECTED Final   Salmonella species NOT DETECTED NOT DETECTED Final   Serratia marcescens NOT DETECTED NOT DETECTED Final   Haemophilus influenzae NOT DETECTED NOT DETECTED Final   Neisseria meningitidis NOT DETECTED NOT DETECTED Final   Pseudomonas aeruginosa NOT DETECTED NOT DETECTED Final   Stenotrophomonas maltophilia NOT DETECTED NOT DETECTED Final   Candida albicans NOT DETECTED NOT DETECTED Final   Candida auris NOT DETECTED NOT DETECTED Final   Candida glabrata NOT DETECTED NOT DETECTED Final   Candida krusei NOT DETECTED NOT DETECTED Final   Candida parapsilosis NOT DETECTED NOT DETECTED Final   Candida tropicalis NOT DETECTED NOT DETECTED Final   Cryptococcus neoformans/gattii NOT DETECTED NOT DETECTED Final    Comment: Performed at [facility], [place] [address],  [place], [place] [address]  Urine culture     Status: None   Collection Time: 06/24/20  1:09 AM   Specimen: In/Out Cath Urine  Result Value Ref Range Status   Specimen Description   Final    IN/OUT CATH URINE Performed at [facility]  [address] [place], [place] [address]    Special Requests   Final    NONE Performed at [facility], [place] [address] [place], [place] [address]    Culture   Final    NO GROWTH Performed at [facility], [place] [address], [place], [place] [address]    Report Status 06/25/2020 FINAL  Final         Radiology Studies: CT Angio Chest PE W and/or Wo Contrast  Result Date: 06/24/2020 CLINICAL DATA:  Abdominal pain, fever post operatively, recent robotic TAH BSO 06/20/2020 EXAM: CT ANGIOGRAPHY CHEST CT ABDOMEN AND PELVIS WITH CONTRAST TECHNIQUE: Multidetector CT imaging of the chest was performed using the standard protocol during bolus administration of intravenous contrast. Multiplanar CT image reconstructions and MIPs were obtained to evaluate the vascular anatomy. Multidetector CT imaging of the abdomen and pelvis was performed using the standard protocol during bolus administration of intravenous contrast. CONTRAST:  123mL OMNIPAQUE IOHEXOL 350 MG/ML SOLN COMPARISON:  CT chest 08/19/2017 pelvic ultrasound 04/10/2020 FINDINGS: CTA CHEST FINDINGS Cardiovascular: Satisfactory opacification the pulmonary arteries without large central or lobar filling defects. More distal evaluation limited by respiratory motion artifact. Central pulmonary arteries are normal caliber. Normal heart size. No pericardial effusion. Coronary artery calcifications are present. The aortic root is suboptimally assessed given cardiac pulsation artifact. The aorta is normal caliber. No acute luminal abnormality of the imaged aorta. No periaortic stranding or hemorrhage. Normal 3 vessel branching of the aortic arch. Proximal great vessels are  unremarkable. No major venous abnormalities. Mediastinum/Nodes: No mediastinal fluid or gas. Normal thyroid gland and thoracic inlet. No acute abnormality of the esophagus. Fairly significant posterior bowing of the trachea and central airways, greater than is typically expected for imaging during exhalation, could reflect some degree of tracheal bronchomalacia, correlate with clinical features. No worrisome mediastinal, hilar or axillary adenopathy. Lungs/Pleura: Evaluation limited by respiratory motion artifact. Low lung volumes likely accentuated by imaging during exhalation for the angiographic technique. No consolidation, features of edema, pneumothorax, or effusion. Tiny 3 mm nodule in the periphery of the left upper lobe, new from prior (4/28). No other concerning pulmonary nodules or masses. Musculoskeletal: Multilevel degenerative changes are present in the imaged portions of the spine. No acute osseous abnormality or suspicious osseous lesion. Worrisome chest wall lesions. Review of the MIP images confirms the above findings. CT ABDOMEN and PELVIS FINDINGS Hepatobiliary: Diffuse hepatic hypoattenuation compatible with hepatic steatosis. Sparing along the gallbladder fossa. Smooth liver surface contour. No worrisome focal liver lesions. Normal gallbladder and biliary tree. Pancreas: No pancreatic ductal dilatation or surrounding inflammatory changes. Spleen: Normal in size. No concerning splenic lesions. Adrenals/Urinary Tract: Normal adrenal glands. Kidneys are normally located with symmetric enhancement and excretion. No suspicious renal mass or urolithiasis. Asymmetric right urinary tract dilatation with mild urothelial thickening and periureteral hazy stranding which courses into the immediate vicinity of postsurgical changes present in the deep pelvis and detailed below. No visible obstructive urolithiasis is seen. Some fairly focal posterior bladder wall thickening. No intraluminal gas. Stomach/Bowel:  Distal esophagus, stomach and duodenal sweep are unremarkable. No small bowel wall thickening or dilatation. No evidence of obstruction. Appendix is not visualized. No focal inflammation the vicinity of the cecum to suggest an occult appendicitis. No proximal colonic thickening or dilatation. Rectosigmoid thickening is present in the vicinity of the postsurgical change with some perirectal fat stranding as well. Vascular/Lymphatic: Atherosclerotic calcifications within the abdominal aorta and branch vessels. No aneurysm or ectasia. No enlarged abdominopelvic lymph nodes.  Reproductive: Postsurgical changes from recent total abdominal hysterectomy and bilateral salpingo-oophorectomy with some lobular collection of air and fluid about the surgical site some of which is demonstrating some early peripheral rim enhancement which may reflect a developing abscess or collection with associated thickening of the adjacent colon and bladder. Largest component of this collection is present along the posterior bladder wall measuring 3.2 x 5.6 x 5.1 cm in maximal dimensions and an elongated configuration. Poorly discernible fat planes between the rectosigmoid and bladder wall and this collection. Other: Pelvic fluid collection, as above. Additional free fluid layering in the presacral space and pericolic gutters is favored to be redistributed. Some postsurgical changes are seen in the anterior abdominal wall including few foci of subcutaneous gas likely at the site of port placement. Some mild body wall edema as well. Some focal skin thickening and subcutaneous fat stranding along the anterior abdominal wall may also be related to injectable use such as heparin. Musculoskeletal: No acute osseous abnormality or suspicious osseous lesion. Multilevel degenerative changes are present in the imaged portions of the spine. Musculature is normal and symmetric. Review of the MIP images confirms the above findings. IMPRESSION: 1. No central  or lobar pulmonary artery emboli. More distal evaluation limited by respiratory motion artifact. 2. Postsurgical changes from recent total abdominal hysterectomy and bilateral salpingo-oophorectomy with a lobular collection of air and fluid about the surgical site/vaginal cuff some of which is demonstrating some early peripheral rim enhancement which may reflect a developing abscess with associated thickening of the adjacent colon and bladder. Injury of bowel bladder is difficult to fully exclude the absence of luminal contrast media. 3. Asymmetric right urinary tract dilatation with mild urothelial thickening and periureteral hazy stranding which courses into the vicinity of postsurgical changes present in the deep pelvis and collection above. No visible obstructive urolithiasis is seen. Findings could reflect inflammatory stricture secondary to the above process. Furthermore could correlate with urinalysis to exclude concomitant urinary tract infection. 4. New 3 mm nodule in the periphery of the left upper lobe. Consider three to six-month follow-up imaging given patient's history of known malignancy. 5. 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 6. Hepatic steatosis. These results were called by telephone at the time of interpretation on 06/24/2020 at 12:46 am to provider [facility] , who verbally acknowledged these results. Electronically Signed   By: Lovena Le M.D.   On: 06/24/2020 00:49   CT ABDOMEN PELVIS W CONTRAST  Result Date: 06/24/2020 CLINICAL DATA:  Abdominal pain, fever post operatively, recent robotic TAH BSO 06/20/2020 EXAM: CT ANGIOGRAPHY CHEST CT ABDOMEN AND PELVIS WITH CONTRAST TECHNIQUE: Multidetector CT imaging of the chest was performed using the standard protocol during bolus administration of intravenous  contrast. Multiplanar CT image reconstructions and MIPs were obtained to evaluate the vascular anatomy. Multidetector CT imaging of the abdomen and pelvis was performed using the standard protocol during bolus administration of intravenous contrast. CONTRAST:  131mL OMNIPAQUE IOHEXOL 350 MG/ML SOLN COMPARISON:  CT chest 08/19/2017 pelvic ultrasound 04/10/2020 FINDINGS: CTA CHEST FINDINGS Cardiovascular: Satisfactory opacification the pulmonary arteries without large central or lobar filling defects. More distal evaluation limited by respiratory motion artifact. Central pulmonary arteries are normal caliber. Normal heart size. No pericardial effusion. Coronary artery calcifications are present. The aortic root is suboptimally assessed given cardiac pulsation artifact. The aorta is normal caliber. No acute luminal abnormality  of the imaged aorta. No periaortic stranding or hemorrhage. Normal 3 vessel branching of the aortic arch. Proximal great vessels are unremarkable. No major venous abnormalities. Mediastinum/Nodes: No mediastinal fluid or gas. Normal thyroid gland and thoracic inlet. No acute abnormality of the esophagus. Fairly significant posterior bowing of the trachea and central airways, greater than is typically expected for imaging during exhalation, could reflect some degree of tracheal bronchomalacia, correlate with clinical features. No worrisome mediastinal, hilar or axillary adenopathy. Lungs/Pleura: Evaluation limited by respiratory motion artifact. Low lung volumes likely accentuated by imaging during exhalation for the angiographic technique. No consolidation, features of edema, pneumothorax, or effusion. Tiny 3 mm nodule in the periphery of the left upper lobe, new from prior (4/28). No other concerning pulmonary nodules or masses. Musculoskeletal: Multilevel degenerative changes are present in the imaged portions of the spine. No acute osseous abnormality or suspicious osseous lesion. Worrisome  chest wall lesions. Review of the MIP images confirms the above findings. CT ABDOMEN and PELVIS FINDINGS Hepatobiliary: Diffuse hepatic hypoattenuation compatible with hepatic steatosis. Sparing along the gallbladder fossa. Smooth liver surface contour. No worrisome focal liver lesions. Normal gallbladder and biliary tree. Pancreas: No pancreatic ductal dilatation or surrounding inflammatory changes. Spleen: Normal in size. No concerning splenic lesions. Adrenals/Urinary Tract: Normal adrenal glands. Kidneys are normally located with symmetric enhancement and excretion. No suspicious renal mass or urolithiasis. Asymmetric right urinary tract dilatation with mild urothelial thickening and periureteral hazy stranding which courses into the immediate vicinity of postsurgical changes present in the deep pelvis and detailed below. No visible obstructive urolithiasis is seen. Some fairly focal posterior bladder wall thickening. No intraluminal gas. Stomach/Bowel: Distal esophagus, stomach and duodenal sweep are unremarkable. No small bowel wall thickening or dilatation. No evidence of obstruction. Appendix is not visualized. No focal inflammation the vicinity of the cecum to suggest an occult appendicitis. No proximal colonic thickening or dilatation. Rectosigmoid thickening is present in the vicinity of the postsurgical change with some perirectal fat stranding as well. Vascular/Lymphatic: Atherosclerotic calcifications within the abdominal aorta and branch vessels. No aneurysm or ectasia. No enlarged abdominopelvic lymph nodes. Reproductive: Postsurgical changes from recent total abdominal hysterectomy and bilateral salpingo-oophorectomy with some lobular collection of air and fluid about the surgical site some of which is demonstrating some early peripheral rim enhancement which may reflect a developing abscess or collection with associated thickening of the adjacent colon and bladder. Largest component of this  collection is present along the posterior bladder wall measuring 3.2 x 5.6 x 5.1 cm in maximal dimensions and an elongated configuration. Poorly discernible fat planes between the rectosigmoid and bladder wall and this collection. Other: Pelvic fluid collection, as above. Additional free fluid layering in the presacral space and pericolic gutters is favored to be redistributed. Some postsurgical changes are seen in the anterior abdominal wall including few foci of subcutaneous gas likely at the site of port placement. Some mild body wall edema as well. Some focal skin thickening and subcutaneous fat stranding along the anterior abdominal wall may also be related to injectable use such as heparin. Musculoskeletal: No acute osseous abnormality or suspicious osseous lesion. Multilevel degenerative changes are present in the imaged portions of the spine. Musculature is normal and symmetric. Review of the MIP images confirms the above findings. IMPRESSION: 1. No central or lobar pulmonary artery emboli. More distal evaluation limited by respiratory motion artifact. 2. Postsurgical changes from recent total abdominal hysterectomy and bilateral salpingo-oophorectomy with a lobular collection of air and fluid about the  surgical site/vaginal cuff some of which is demonstrating some early peripheral rim enhancement which may reflect a developing abscess with associated thickening of the adjacent colon and bladder. Injury of bowel bladder is difficult to fully exclude the absence of luminal contrast media. 3. Asymmetric right urinary tract dilatation with mild urothelial thickening and periureteral hazy stranding which courses into the vicinity of postsurgical changes present in the deep pelvis and collection above. No visible obstructive urolithiasis is seen. Findings could reflect inflammatory stricture secondary to the above process. Furthermore could correlate with urinalysis to exclude concomitant urinary tract infection.  4. New 3 mm nodule in the periphery of the left upper lobe. Consider three to six-month follow-up imaging given patient's history of known malignancy. 5. 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 6. Hepatic steatosis. These results were called by telephone at the time of interpretation on 06/24/2020 at 12:46 am to provider [facility] , who verbally acknowledged these results. Electronically Signed   By: Lovena Le M.D.   On: 06/24/2020 00:49   DG Chest Port 1 View  Result Date: 06/23/2020 CLINICAL DATA:  Questionable sepsis EXAM: PORTABLE CHEST 1 VIEW COMPARISON:  CT 08/19/2017, radiograph 04/06/2020 FINDINGS: Accounting for body habitus, the lungs are clear. No consolidation, features of edema, pneumothorax, or effusion. Pulmonary vascularity is normally distributed. The cardiomediastinal contours are unremarkable. No acute osseous or soft tissue abnormality. Telemetry leads overlie the chest. IMPRESSION: No acute cardiopulmonary abnormality. Electronically Signed   By: Lovena Le M.D.   On: 06/23/2020 22:56        Scheduled Meds: . heparin  5,000 Units Subcutaneous Q8H  . insulin aspart  0-20 Units Subcutaneous TID WC  . [START ON 06/28/2020] levothyroxine  25 mcg Intravenous Daily  . [START ON 06/26/2020] pantoprazole  40 mg Oral Q0600   Continuous Infusions: . lactated ringers 125 mL/hr at 06/25/20 0721  . piperacillin-tazobactam (ZOSYN)  IV 3.375 g (06/25/20 1259)     LOS: 1 day    Time spent: 35 minutes    Irine Seal, MD [facility]   To contact the attending provider between 7A-7P or the covering provider during after hours 7P-7A, please log into the web site [email or web address] and access using universal [place] password for that web site. If you do not have the password, please  call the hospital operator.  06/25/2020, 4:23 PM

## 2020-06-25 NOTE — Progress Notes (Signed)
PHARMACY - PHYSICIAN COMMUNICATION CRITICAL VALUE ALERT - BLOOD CULTURE IDENTIFICATION (BCID)  Christy Carpenter is an 64 y.o. female who presented to [facility] on 06/23/2020 with post-op fever (s/p robotic hysterectomy, BSO sentinel lymph node biopsy and right ureterolysis on 06/20/20), SIRS, pelvic fluid collection.   Assessment: presumed pelvic abscess  Name of physician (or Provider) Contacted: Joylene John, NP  Current antibiotics: Zosyn  Changes to prescribed antibiotics recommended:  Patient is on recommended antibiotics - No changes needed  Results for orders placed or performed during the hospital encounter of 06/23/20  Blood Culture ID Panel (Reflexed) (Collected: 06/23/2020 10:36 PM)  Result Value Ref Range   Enterococcus faecalis NOT DETECTED NOT DETECTED   Enterococcus Faecium NOT DETECTED NOT DETECTED   Listeria monocytogenes NOT DETECTED NOT DETECTED   Staphylococcus species NOT DETECTED NOT DETECTED   Staphylococcus aureus (BCID) NOT DETECTED NOT DETECTED   Staphylococcus epidermidis NOT DETECTED NOT DETECTED   Staphylococcus lugdunensis NOT DETECTED NOT DETECTED   Streptococcus species NOT DETECTED NOT DETECTED   Streptococcus agalactiae NOT DETECTED NOT DETECTED   Streptococcus pneumoniae NOT DETECTED NOT DETECTED   Streptococcus pyogenes NOT DETECTED NOT DETECTED   A.calcoaceticus-baumannii NOT DETECTED NOT DETECTED   Bacteroides fragilis DETECTED (A) NOT DETECTED   Enterobacterales NOT DETECTED NOT DETECTED   Enterobacter cloacae complex NOT DETECTED NOT DETECTED   Escherichia coli NOT DETECTED NOT DETECTED   Klebsiella aerogenes NOT DETECTED NOT DETECTED   Klebsiella oxytoca NOT DETECTED NOT DETECTED   Klebsiella pneumoniae NOT DETECTED NOT DETECTED   Proteus species NOT DETECTED NOT DETECTED   Salmonella species NOT DETECTED NOT DETECTED   Serratia marcescens NOT DETECTED NOT DETECTED   Haemophilus influenzae NOT DETECTED NOT DETECTED   Neisseria meningitidis  NOT DETECTED NOT DETECTED   Pseudomonas aeruginosa NOT DETECTED NOT DETECTED   Stenotrophomonas maltophilia NOT DETECTED NOT DETECTED   Candida albicans NOT DETECTED NOT DETECTED   Candida auris NOT DETECTED NOT DETECTED   Candida glabrata NOT DETECTED NOT DETECTED   Candida krusei NOT DETECTED NOT DETECTED   Candida parapsilosis NOT DETECTED NOT DETECTED   Candida tropicalis NOT DETECTED NOT DETECTED   Cryptococcus neoformans/gattii NOT DETECTED NOT DETECTED    Luiz Ochoa 06/25/2020  10:58 AM

## 2020-06-25 NOTE — Progress Notes (Signed)
GYN Oncology Progress Note   Subjective: Patient reports not feeling well this am. Intermittent nausea. No emesis reported. States she has had an incontinent episode of diarrhea. Wanting something to drink. Reports feeling poorly last night when having fever. No pain reported this am. Vaginal drainage reported by nurse tech. Refusing to have labs per RN but states she will let labs be drawn if she can have something to drink. Sister at the bedside.   Objective: Vital signs in last 24 hours: Temp:  [97.4 F (36.3 C)-101.6 F (38.7 C)] 98.9 F (37.2 C) (11/09 0937) Pulse Rate:  [90-113] 90 (11/09 0937) Resp:  [16-26] 19 (11/09 0937) BP: (106-144)/(62-77) 122/64 (11/09 0937) SpO2:  [96 %-100 %] 99 % (11/09 0937) Last BM Date: 06/25/20  Intake/Output from previous day: 11/08 0701 - 11/09 0700 In: 2365.5 [P.O.:120; I.V.:2076; IV Piggyback:169.5] Out: 800 [Urine:800]  Physical Examination: General: alert, cooperative, no distress and appears uncomfortable Resp: clear to auscultation bilaterally Cardio: regular rate and rhythm, S1, S2 normal, no murmur, click, rub or gallop GI: incision: lap sites to the abdomen with dermabond without erythema or drainage and abdomen rounded, firm (similar to pre-op status), active bowel sounds, non-tympanic Extremities: extremities normal, atraumatic, no cyanosis or edema  Labs: WBC/Hgb/Hct/Plts:  12.6/9.9/30.2/178 (11/08 1654)    Assessment: 64 y.o. with grade 1 endometrial adenocarcinoma in the setting of obesity, type 2 poorly controlled diabetes mellitus, s/p robotic hysterectomy, BSO sentinel lymph node biopsy and right ureterolysis on 06/20/20. Now admitted with SIRS, a pelvic fluid collection, and GI symptoms concerning for ileus. After receipt of IV fluids and antibiotics, her vitals quickly normalized in the ER.   Pain:  Pain is well-controlled on PRN medications.  Heme: Last Hgb on 06/24/2020 was 9.9 with Hct 30.2.   ID: Currently on IV Zosyn  for presumed pelvic abscess/SIRS. WBC from 06/24/20 was 12.6. Urine culture from ED without growth. Blood culture ID panel from 06/23/20 positive for bacteroides fragilis.  CV: Mildly tachycardic at times. BP stable-Hx HTN. Hyzaar on hold given hypotension on admission. Will continue to monitor and order when appropriate.  GI:  Tolerating po: No: NPO. Antiemetics ordered as needed. CT CAP from  06/24/20:  IMPRESSION: 1. No central or lobar pulmonary artery emboli. More distal evaluation limited by respiratory motion artifact. 2. Postsurgical changes from recent total abdominal hysterectomy and bilateral salpingo-oophorectomy with a lobular collection of air and fluid about the surgical site/vaginal cuff some of which is demonstrating some early peripheral rim enhancement which may reflect a developing abscess with associated thickening of the adjacent colon and bladder. Injury of bowel bladder is difficult to fully exclude the absence of luminal contrast media. 3. Asymmetric right urinary tract dilatation with mild urothelial thickening and periureteral hazy stranding which courses into the vicinity of postsurgical changes present in the deep pelvis and collection above. No visible obstructive urolithiasis is seen. Findings could reflect inflammatory stricture secondary to the above process. Furthermore could correlate with urinalysis to exclude concomitant urinary tract infection. 4. New 3 mm nodule in the periphery of the left upper lobe. Consider three to six-month follow-up imaging given patient's history of known malignancy. 5. 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 6. Hepatic steatosis.  GU: Creatinine 1.17 on 06/24/20.    FEN: No critical values from 06/24/20.  Endo: Diabetes mellitus Type II, under fair control.  CBG:  CBG (last 3)  Recent Labs    06/25/20 0501 06/25/20 0756 06/25/20 0955  GLUCAP 105* 104* 116*    Prophylaxis: Heparin ordered Q8H.  Plan: Spoke with Dr. Eustaquio Maize have re-attempt at lab draw for CBC and Bmet Continue IV zosyn Appreciate Hospitalist consultation for glucose management Continue plan of care per Dr. Denman George   LOS: 1 day    Lumi Winslett D Merel Santoli 06/25/2020, 10:24 AM   Update: Martin Majestic back to the room and spoke with patient about the importance of getting lab work today for further evaluation of status. She states she is willing to have labs drawn but the lab has had difficulty drawing blood. RN notified to have lab reattempt.

## 2020-06-26 ENCOUNTER — Inpatient Hospital Stay: Payer: BC Managed Care – PPO | Admitting: Gynecologic Oncology

## 2020-06-26 DIAGNOSIS — E039 Hypothyroidism, unspecified: Secondary | ICD-10-CM | POA: Diagnosis not present

## 2020-06-26 DIAGNOSIS — E1165 Type 2 diabetes mellitus with hyperglycemia: Secondary | ICD-10-CM | POA: Diagnosis not present

## 2020-06-26 DIAGNOSIS — K219 Gastro-esophageal reflux disease without esophagitis: Secondary | ICD-10-CM | POA: Diagnosis not present

## 2020-06-26 DIAGNOSIS — G629 Polyneuropathy, unspecified: Secondary | ICD-10-CM | POA: Diagnosis not present

## 2020-06-26 DIAGNOSIS — E8770 Fluid overload, unspecified: Secondary | ICD-10-CM

## 2020-06-26 LAB — CBC WITH DIFFERENTIAL/PLATELET
Abs Immature Granulocytes: 0.05 10*3/uL (ref 0.00–0.07)
Basophils Absolute: 0 10*3/uL (ref 0.0–0.1)
Basophils Relative: 0 %
Eosinophils Absolute: 0.1 10*3/uL (ref 0.0–0.5)
Eosinophils Relative: 1 %
HCT: 24.5 % — ABNORMAL LOW (ref 36.0–46.0)
Hemoglobin: 8.4 g/dL — ABNORMAL LOW (ref 12.0–15.0)
Immature Granulocytes: 1 %
Lymphocytes Relative: 14 %
Lymphs Abs: 1.4 10*3/uL (ref 0.7–4.0)
MCH: 30 pg (ref 26.0–34.0)
MCHC: 34.3 g/dL (ref 30.0–36.0)
MCV: 87.5 fL (ref 80.0–100.0)
Monocytes Absolute: 0.5 10*3/uL (ref 0.1–1.0)
Monocytes Relative: 5 %
Neutro Abs: 8.1 10*3/uL — ABNORMAL HIGH (ref 1.7–7.7)
Neutrophils Relative %: 79 %
Platelets: 178 10*3/uL (ref 150–400)
RBC: 2.8 MIL/uL — ABNORMAL LOW (ref 3.87–5.11)
RDW: 13.5 % (ref 11.5–15.5)
WBC: 10.1 10*3/uL (ref 4.0–10.5)
nRBC: 0 % (ref 0.0–0.2)

## 2020-06-26 LAB — GLUCOSE, CAPILLARY
Glucose-Capillary: 111 mg/dL — ABNORMAL HIGH (ref 70–99)
Glucose-Capillary: 116 mg/dL — ABNORMAL HIGH (ref 70–99)
Glucose-Capillary: 109 mg/dL — ABNORMAL HIGH (ref 70–99)
Glucose-Capillary: 150 mg/dL — ABNORMAL HIGH (ref 70–99)
Glucose-Capillary: 189 mg/dL — ABNORMAL HIGH (ref 70–99)

## 2020-06-26 LAB — BASIC METABOLIC PANEL
Anion gap: 9 (ref 5–15)
BUN: 17 mg/dL (ref 8–23)
CO2: 18 mmol/L — ABNORMAL LOW (ref 22–32)
Calcium: 7.9 mg/dL — ABNORMAL LOW (ref 8.9–10.3)
Chloride: 107 mmol/L (ref 98–111)
Creatinine, Ser: 1.01 mg/dL — ABNORMAL HIGH (ref 0.44–1.00)
GFR, Estimated: >60 mL/min (ref >60)
Glucose, Bld: 119 mg/dL — ABNORMAL HIGH (ref 70–99)
Potassium: 3.2 mmol/L — ABNORMAL LOW (ref 3.5–5.1)
Sodium: 134 mmol/L — ABNORMAL LOW (ref 135–145)

## 2020-06-26 LAB — MAGNESIUM: Magnesium: 1.9 mg/dL (ref 1.7–2.4)

## 2020-06-26 MED ORDER — POTASSIUM CHLORIDE CRYS ER 20 MEQ PO TBCR
40.0000 meq | EXTENDED_RELEASE_TABLET | ORAL | Status: AC
  Administered 2020-06-26 (×2): 40 meq via ORAL
  Filled 2020-06-26 (×2): qty 2

## 2020-06-26 MED ORDER — LEVOTHYROXINE SODIUM 50 MCG PO TABS
50.0000 ug | ORAL_TABLET | Freq: Every day | ORAL | Status: DC
  Administered 2020-06-26 – 2020-06-28 (×3): 50 ug via ORAL
  Filled 2020-06-26 (×3): qty 1

## 2020-06-26 MED ORDER — FUROSEMIDE 10 MG/ML IJ SOLN
20.0000 mg | Freq: Once | INTRAMUSCULAR | Status: AC
  Administered 2020-06-26: 20 mg via INTRAVENOUS
  Filled 2020-06-26: qty 2

## 2020-06-26 MED ORDER — PREGABALIN 75 MG PO CAPS
150.0000 mg | ORAL_CAPSULE | Freq: Three times a day (TID) | ORAL | Status: DC
  Administered 2020-06-26 – 2020-06-28 (×7): 150 mg via ORAL
  Filled 2020-06-26 (×7): qty 2

## 2020-06-26 MED ORDER — POTASSIUM CHLORIDE 20 MEQ PO PACK
40.0000 meq | PACK | ORAL | Status: DC
  Administered 2020-06-26: 40 meq via ORAL
  Filled 2020-06-26 (×2): qty 2

## 2020-06-26 MED ORDER — SODIUM BICARBONATE 650 MG PO TABS
650.0000 mg | ORAL_TABLET | Freq: Two times a day (BID) | ORAL | Status: DC
  Administered 2020-06-26 – 2020-06-28 (×5): 650 mg via ORAL
  Filled 2020-06-26 (×5): qty 1

## 2020-06-26 NOTE — Progress Notes (Addendum)
Consult/PROGRESS NOTE    Christy Carpenter  DXI:[id number] DOB: August 25, 1955 DOA: 06/23/2020 PCP: [name], MD    Chief Complaint  Patient presents with  . 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  Hospitalist service was consulted to help with diabetes management.  Patient just received IV Dilaudid and is slightly drowsy and a poor historian at time of hospitalist evaluation.  She denies current nausea or vomiting.  States that her pain is well controlled at this time.  She has had fevers and chills along with some nausea and vomiting over the last few days.   Assessment & Plan:   Principal Problem:   SIRS due to infectious process with acute organ dysfunction [facility]) Active Problems:   Uncontrolled type 2 diabetes mellitus with hyperglycemia (HCC)   Endometrial cancer (HCC)   Obesity (BMI 30.0-34.9)   SIRS (systemic inflammatory response syndrome) (HCC)   Hypothyroidism   Gastroesophageal reflux disease   Neuropathy   Pelvic fluid collection   Type 2 diabetes mellitus with hyperlipidemia (HCC)  1 diabetes mellitus type 2  Hemoglobin A1c 8.2 (06/11/2020).  Patient normally uses Novolin R 75 units 3 times a day, Novolin 75 units 3 times a day with Janumet and [place].  Patient placed back on a carb modified diet per primary service.  Patient with a bout of emesis this morning.  Hemoglobin A1c 8.2 (06/11/2020).  CBG at 116 this morning.  Continue current regimen of sliding scale insulin.  As oral intake improves if worsening hyperglycemia with fasting blood glucose levels > 180 we will start Lantus 15 units daily.  Continue  sliding scale insulin.  Once patient is stable for discharge will recommend resumption of home regimen of insulin with close outpatient follow-up with endocrinologist.  Diabetes coordinator following.  Supportive care.    2.  Pelvic fluid collection/possible pelvic abscess Status post recent robotic hysterectomy, BSO sentinel lymph node biopsy and right ureterolysis on 06/20/2020 for grade 1 endometrial adenocarcinoma.  Patient currently on IV fluids IV Zosyn. Per primary GYN team.  3.  Hypothyroidism Discontinue IV Synthroid.  Place back on home dose oral Synthroid.   4.  Hyperlipidemia Continue to hold statin, fenofibrate.  Resume on discharge.  5.  Gastroesophageal reflux disease Continue PPI.   6.  Neuropathy of both feet Resume home regimen Lyrica.  7.  Dyspnea/? Volume overload Patient with some complaints of dyspnea slightly worse than her baseline.  Concern for possible volume overload.  Patient aggressively hydrated on admission.  Patient is positive for 465 cc however unsure how accurate this is.  Patient with some decreased breath sounds in the bases.  Patient with some trace lower extremity edema.  Current weight of 95.7 kg from 87.6 kg on admission.  Saline lock IV fluids.  We will give a dose of Lasix 20 mg IV every 12 hours x2 doses.  Strict I's and O's.  Daily weights.  Follow.  8.  Hypokalemia Replete.   DVT prophylaxis: Heparin Code Status: Full Family Communication: Updated patient and sister at bedside. Disposition:   Status is: Inpatient    Dispo: The patient is from: Home  Anticipated d/c is to: Home              Anticipated d/c date is: Per primary team              Patient currently with significant abdominal pain, being managed by primary team.  Disposition per primary team.       Consultants:   [facility] hospitalist: Dr. Starla Link 06/24/2020  Procedures:   CT abdomen and pelvis 06/24/2020  CT angiogram chest 06/24/2020  Chest x-ray  06/23/2020    Antimicrobials:   IV cefepime 11/7/2021x1 dose  IV vancomycin 11/7/2021x1 dose  IV Zosyn 06/24/2020 >>>>>  IV Flagyl 11/7/2021x1 dose   Subjective: Patient sitting up on the side of the bed.  Complaining of abdominal pain.  Stated she has had a bout of emesis this morning.  Some complaints of shortness of breath slightly worse than baseline.  Complaining of some chest congestion.  No chest pain.   Objective: Vitals:   06/25/20 1738 06/25/20 2118 06/26/20 0530 06/26/20 1300  BP: 124/63 140/67 134/65   Pulse: 97 (!) 103 91   Resp:  16 16   Temp: 98.6 F (37 C) 98.3 F (36.8 C) 98.6 F (37 C)   TempSrc: Oral Oral Oral   SpO2: 99% 97% 95%   Weight:    95.7 kg  Height:        Intake/Output Summary (Last 24 hours) at 06/26/2020 1311 Last data filed at 06/26/2020 1000 Gross per 24 hour  Intake 240 ml  Output 0 ml  Net 240 ml   Filed Weights   06/23/20 2145 06/26/20 1300  Weight: 87.6 kg 95.7 kg    Examination:  General exam: NAD Respiratory system: Decreased breath sounds in the bases.  No crackles.  No rhonchi.  Speaking in full sentences.  Normal respiratory effort.   Cardiovascular system: Regular rate rhythm no murmurs rubs or gallops.  No JVD.  Trace bilateral lower extremity edema. Gastrointestinal system: Abdomen mildly distended, soft, positive bowel sounds, tender to palpation in the lower abdominal region.   Central nervous system: Alert and oriented. No focal neurological deficits. Extremities: Symmetric 5 x 5 power. Skin: No rashes, lesions or ulcers Psychiatry: Judgement and insight appear normal. Mood & affect appropriate.     Data Reviewed: I have personally reviewed following labs and imaging studies  CBC: Recent Labs  Lab 06/21/20 0504 06/23/20 2147 06/24/20 1654 06/25/20 1118 06/26/20 0501  WBC 9.4 8.3 12.6* 10.9* 10.1  NEUTROABS  --  7.4 10.5* 9.3* 8.1*  HGB 10.5* 10.7* 9.9* 8.8* 8.4*  HCT 30.9* 31.9* 30.2* 26.8* 24.5*   MCV 89.3 88.4 92.4 91.5 87.5  PLT 199 191 178 173 408    Basic Metabolic Panel: Recent Labs  Lab 06/21/20 0504 06/23/20 2147 06/24/20 0800 06/25/20 1118 06/26/20 0501  NA 135 135 136 138 134*  K 4.0 3.5 3.8 3.5 3.2*  CL 103 107 105 108 107  CO2 21* 18* 19* 19* 18*  GLUCOSE 191* 160* 89 117* 119*  BUN 18 18 19 19 17   CREATININE 1.04* 1.18* 1.17* 1.15* 1.01*  CALCIUM 8.6* 8.7* 8.7* 9.0 7.9*  MG  --   --   --   --  1.9    GFR: Estimated Creatinine Clearance: 59.8 mL/min (A) (by C-G formula based on SCr of 1.01 mg/dL (H)).  Liver Function Tests: Recent Labs  Lab 06/23/20 2147  AST 25  ALT 24  ALKPHOS 118  BILITOT 0.8  PROT 7.5  ALBUMIN  3.5    CBG: Recent Labs  Lab 06/25/20 1732 06/25/20 2353 06/26/20 0528 06/26/20 0758 06/26/20 1151  GLUCAP 110* 107* 111* 116* 109*     Recent Results (from the past 240 hour(s))  SARS CORONAVIRUS 2 (TAT 6-24 HRS) Nasopharyngeal Nasopharyngeal Swab     Status: None   Collection Time: 06/17/20  1:16 PM   Specimen: Nasopharyngeal Swab  Result Value Ref Range Status   SARS Coronavirus 2 NEGATIVE NEGATIVE Final    Comment: (NOTE) SARS-CoV-2 target nucleic acids are NOT DETECTED.  The SARS-CoV-2 RNA is generally detectable in upper and lower respiratory specimens during the acute phase of infection. Negative results do not preclude SARS-CoV-2 infection, do not rule out co-infections with other pathogens, and should not be used as the sole basis for treatment or other patient management decisions. Negative results must be combined with clinical observations, patient history, and epidemiological information. The expected result is Negative.  Fact Sheet for Patients: SugarRoll.be  Fact Sheet for Healthcare Providers: https://www.woods-mathews.com/  This test is not yet approved or cleared by the [place] FDA and  has been authorized for detection and/or diagnosis of SARS-CoV-2  by FDA under an Emergency Use Authorization (EUA). This EUA will remain  in effect (meaning this test can be used) for the duration of the COVID-19 declaration under Se ction 564(b)(1) of the Act, 21 U.S.C. section 360bbb-3(b)(1), unless the authorization is terminated or revoked sooner.  Performed at [facility], [place] [address] [place], [place] [address]   Blood culture (routine x 2)     Status: None (Preliminary result)   Collection Time: 06/23/20  9:47 PM   Specimen: BLOOD  Result Value Ref Range Status   Specimen Description   Final    BLOOD LEFT ANTECUBITAL Performed at [facility] [address] [place], [place] [address]    Special Requests   Final    BOTTLES DRAWN AEROBIC AND ANAEROBIC Blood Culture adequate volume Performed at [facility] [address] [place], [place] [address]    Culture   Final    NO GROWTH 3 DAYS Performed at [facility], [place] [address], [place], [place] [address]    Report Status PENDING  Incomplete  Respiratory Panel by RT PCR (Flu A&B, Covid) - Nasopharyngeal Swab     Status: None   Collection Time: 06/23/20  9:54 PM   Specimen: Nasopharyngeal Swab  Result Value Ref Range Status   SARS Coronavirus 2 by RT PCR NEGATIVE NEGATIVE Final    Comment: (NOTE) SARS-CoV-2 target nucleic acids are NOT DETECTED.  The SARS-CoV-2 RNA is generally detectable in upper respiratoy specimens during the acute phase of infection. The lowest concentration of SARS-CoV-2 viral copies this assay can detect is 131 copies/mL. A negative result does not preclude SARS-Cov-2 infection and should not be used as the sole basis for treatment or other patient management decisions. A negative result may occur with  improper specimen collection/handling, submission of specimen other than nasopharyngeal swab, presence of viral mutation(s) within the areas targeted by this assay, and inadequate number of viral  copies (<131 copies/mL). A negative result must be combined with clinical observations, patient history, and epidemiological information. The expected result is Negative.  Fact Sheet for Patients:  PinkCheek.be  Fact Sheet for Healthcare Providers:  GravelBags.it  This test is no t yet approved or cleared by the [place] FDA and  has been authorized for detection and/or diagnosis of SARS-CoV-2 by  FDA under an Emergency Use Authorization (EUA). This EUA will remain  in effect (meaning this test can be used) for the duration of the COVID-19 declaration under Section 564(b)(1) of the Act, 21 U.S.C. section 360bbb-3(b)(1), unless the authorization is terminated or revoked sooner.     Influenza A by PCR NEGATIVE NEGATIVE Final   Influenza B by PCR NEGATIVE NEGATIVE Final    Comment: (NOTE) The Xpert Xpress SARS-CoV-2/FLU/RSV assay is intended as an aid in  the diagnosis of influenza from Nasopharyngeal swab specimens and  should not be used as a sole basis for treatment. Nasal washings and  aspirates are unacceptable for Xpert Xpress SARS-CoV-2/FLU/RSV  testing.  Fact Sheet for Patients: PinkCheek.be  Fact Sheet for Healthcare Providers: GravelBags.it  This test is not yet approved or cleared by the [place] FDA and  has been authorized for detection and/or diagnosis of SARS-CoV-2 by  FDA under an Emergency Use Authorization (EUA). This EUA will remain  in effect (meaning this test can be used) for the duration of the  Covid-19 declaration under Section 564(b)(1) of the Act, 21  U.S.C. section 360bbb-3(b)(1), unless the authorization is  terminated or revoked. Performed at [facility], [place] [address] [place], [place] [address]   Blood culture (routine x 2)     Status: None (Preliminary result)   Collection Time: 06/23/20 10:36 PM    Specimen: BLOOD  Result Value Ref Range Status   Specimen Description   Final    BLOOD LEFT ANTECUBITAL Performed at [facility] [address], [place], [place] [address]    Special Requests   Final    BOTTLES DRAWN AEROBIC AND ANAEROBIC Blood Culture adequate volume Performed at [facility] [address] [place], [place] [address]    Culture  Setup Time   Final    GRAM NEGATIVE RODS ANAEROBIC BOTTLE ONLY Organism ID to follow CRITICAL RESULT CALLED TO, READ BACK BY AND VERIFIED WITH: Melodye Ped PharmD 10:55 06/25/20 ([email or web address]) Performed at [facility], [address] [place], [place] [address]    Culture GRAM NEGATIVE RODS  Final   Report Status PENDING  Incomplete  Blood Culture ID Panel (Reflexed)     Status: Abnormal   Collection Time: 06/23/20 10:36 PM  Result Value Ref Range Status   Enterococcus faecalis NOT DETECTED NOT DETECTED Final   Enterococcus Faecium NOT DETECTED NOT DETECTED Final   Listeria monocytogenes NOT DETECTED NOT DETECTED Final   Staphylococcus species NOT DETECTED NOT DETECTED Final   Staphylococcus aureus (BCID) NOT DETECTED NOT DETECTED Final   Staphylococcus epidermidis NOT DETECTED NOT DETECTED Final   Staphylococcus lugdunensis NOT DETECTED NOT DETECTED Final   Streptococcus species NOT DETECTED NOT DETECTED Final   Streptococcus agalactiae NOT DETECTED NOT DETECTED Final   Streptococcus pneumoniae NOT DETECTED NOT DETECTED Final   Streptococcus pyogenes NOT DETECTED NOT DETECTED Final   A.calcoaceticus-baumannii NOT DETECTED NOT DETECTED Final   Bacteroides fragilis DETECTED (A) NOT DETECTED Final    Comment: CRITICAL RESULT CALLED TO, READ BACK BY AND VERIFIED WITH: Melodye Ped PharmD 10:55 06/25/20 ([email or web address])    Enterobacterales NOT DETECTED NOT DETECTED Final   Enterobacter cloacae complex NOT DETECTED NOT DETECTED Final   Escherichia coli NOT DETECTED NOT DETECTED Final   Klebsiella aerogenes  NOT DETECTED NOT DETECTED Final   Klebsiella oxytoca NOT DETECTED NOT DETECTED Final   Klebsiella pneumoniae NOT DETECTED NOT DETECTED Final   Proteus species NOT DETECTED NOT  DETECTED Final   Salmonella species NOT DETECTED NOT DETECTED Final   Serratia marcescens NOT DETECTED NOT DETECTED Final   Haemophilus influenzae NOT DETECTED NOT DETECTED Final   Neisseria meningitidis NOT DETECTED NOT DETECTED Final   Pseudomonas aeruginosa NOT DETECTED NOT DETECTED Final   Stenotrophomonas maltophilia NOT DETECTED NOT DETECTED Final   Candida albicans NOT DETECTED NOT DETECTED Final   Candida auris NOT DETECTED NOT DETECTED Final   Candida glabrata NOT DETECTED NOT DETECTED Final   Candida krusei NOT DETECTED NOT DETECTED Final   Candida parapsilosis NOT DETECTED NOT DETECTED Final   Candida tropicalis NOT DETECTED NOT DETECTED Final   Cryptococcus neoformans/gattii NOT DETECTED NOT DETECTED Final    Comment: Performed at [facility], [place] [address], [place], [place] [address]  Urine culture     Status: None   Collection Time: 06/24/20  1:09 AM   Specimen: In/Out Cath Urine  Result Value Ref Range Status   Specimen Description   Final    IN/OUT CATH URINE Performed at [facility] [address] [place], [place] [address]    Special Requests   Final    NONE Performed at [facility], [place] [address], [place], [place] [address]    Culture   Final    NO GROWTH Performed at [facility], [place] [address], [place], [place] [address]    Report Status 06/25/2020 FINAL  Final  C Difficile Quick Screen w PCR reflex     Status: None   Collection Time: 06/25/20  7:00 PM   Specimen: STOOL  Result Value Ref Range Status   C Diff antigen NEGATIVE NEGATIVE Final   C Diff toxin NEGATIVE NEGATIVE Final   C Diff interpretation No C. difficile detected.  Final    Comment: Performed at [facility], [place] [address], [place],  [place] [address]         Radiology Studies: No results found.      Scheduled Meds: . heparin  5,000 Units Subcutaneous Q8H  . insulin aspart  0-20 Units Subcutaneous TID WC  . levothyroxine  50 mcg Oral Q0600  . pantoprazole  40 mg Oral Daily  . potassium chloride  40 mEq Oral Q4H  . pregabalin  150 mg Oral TID  . sodium bicarbonate  650 mg Oral BID   Continuous Infusions: . lactated ringers 125 mL/hr at 06/26/20 1053  . piperacillin-tazobactam (ZOSYN)  IV 3.375 g (06/26/20 0519)     LOS: 2 days    Time spent: 35 minutes    Irine Seal, MD [facility]   To contact the attending provider between 7A-7P or the covering provider during after hours 7P-7A, please log into the web site [email or web address] and access using universal [place] password for that web site. If you do not have the password, please call the hospital operator.  06/26/2020, 1:11 PM

## 2020-06-26 NOTE — Progress Notes (Signed)
GYN Oncology Progress Note   Subjective: Patient reports having lower pelvic cramping similar to having a "bad period." It is hard for her to describe the discomfort in more detail. States she had chicken broth last pm and tolerated it. No nausea or emesis reported. States the diarrhea has slowed. Reporting intermittent shortness of breath that is "not new." She feels dizzy and weak when out of bed. Minimal pink vaginal discharge reported. No needs or concerns voiced.  Objective: Vital signs in last 24 hours: Temp:  [98.3 F (36.8 C)-98.9 F (37.2 C)] 98.6 F (37 C) (11/10 0530) Pulse Rate:  [90-103] 91 (11/10 0530) Resp:  [16-19] 16 (11/10 0530) BP: (115-140)/(63-68) 134/65 (11/10 0530) SpO2:  [95 %-99 %] 95 % (11/10 0530) Last BM Date: 06/25/20  Intake/Output from previous day: 11/09 0701 - 11/10 0700 In: -  Out: 300 [Urine:300]  Physical Examination: General: alert, cooperative, no distress and appears uncomfortable Resp: clear to auscultation bilaterally Cardio: regular rate and rhythm, S1, S2 normal, no murmur, click, rub or gallop GI: incision: lap sites to the abdomen with dermabond without erythema or drainage and abdomen rounded, firm (similar to pre-op status), active bowel sounds, non-tympanic Extremities: extremities normal, atraumatic, no cyanosis or edema  Labs: WBC/Hgb/Hct/Plts:  10.1/8.4/24.5/178 (11/10 0501) BUN/Cr/glu/ALT/AST/amyl/lip:  17/1.01/--/--/--/--/-- (11/10 0501)  Assessment: 64 y.o. with grade 1 endometrial adenocarcinoma in the setting of obesity, type 2 poorly controlled diabetes mellitus, s/p robotic hysterectomy, BSO sentinel lymph node biopsy and right ureterolysis on 06/20/20. Now admitted with SIRS, a pelvic fluid collection, and GI symptoms concerning for ileus. After receipt of IV fluids and antibiotics, her vitals quickly normalized in the ER.   Pain:  Pain is well-controlled on PRN medications.  Heme: Hgb 8.4 and Hct 24.5 this am- overall  stable with mild drop in Hgb related to hemodilution.   ID: Currently on IV Zosyn for presumed pelvic abscess/SIRS. WBC 10.1 this am and trending downward. Urine culture from ED without growth. Blood culture ID panel from 06/23/20 positive for bacteroides fragilis.  CV: Mildly tachycardic at times-improving. BP stable-Hx HTN. Hyzaar on hold given hypotension on admission. Will continue to monitor and order when appropriate.  GI:  Tolerating po: Yes, carb mod ordered but pt has not taken in solid food. Antiemetics ordered as needed. C diff negative-precautions discontinued. CT CAP from  06/24/20:  IMPRESSION: 1. No central or lobar pulmonary artery emboli. More distal evaluation limited by respiratory motion artifact. 2. Postsurgical changes from recent total abdominal hysterectomy and bilateral salpingo-oophorectomy with a lobular collection of air and fluid about the surgical site/vaginal cuff some of which is demonstrating some early peripheral rim enhancement which may reflect a developing abscess with associated thickening of the adjacent colon and bladder. Injury of bowel bladder is difficult to fully exclude the absence of luminal contrast media. 3. Asymmetric right urinary tract dilatation with mild urothelial thickening and periureteral hazy stranding which courses into the vicinity of postsurgical changes present in the deep pelvis and collection above. No visible obstructive urolithiasis is seen. Findings could reflect inflammatory stricture secondary to the above process. Furthermore could correlate with urinalysis to exclude concomitant urinary tract infection. 4. New 3 mm nodule in the periphery of the left upper lobe. Consider three to six-month follow-up imaging given patient's history of known malignancy. 5. 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 6. Hepatic  steatosis.  GU: Creatinine 1.01 on 06/24/20. Adequate urine output reported.    FEN: K+ 3.2 this am-replacement ordered per Hospitalist Team. Mag 1.9 and Ca+ 7.9  Endo: Diabetes mellitus Type II, under fair control.  CBG:  CBG (last 3)  Recent Labs    06/25/20 2353 06/26/20 0528 06/26/20 0758  GLUCAP 107* 111* 116*    Prophylaxis: Heparin ordered Q8H.  Plan: Continue IV antibiotics for another day. If she remains afebrile by tomorrow am, plan for discharge on oral antibiotics [facility] reordered Appreciate continued Hospitalist management for glucose management Continue plan of care per Dr. Denman George and Hospitalist Team   LOS: 2 days    Christy Carpenter 06/26/2020, 9:36 AM

## 2020-06-27 ENCOUNTER — Inpatient Hospital Stay (HOSPITAL_COMMUNITY): Payer: BC Managed Care – PPO

## 2020-06-27 ENCOUNTER — Inpatient Hospital Stay: Payer: Self-pay

## 2020-06-27 DIAGNOSIS — E1165 Type 2 diabetes mellitus with hyperglycemia: Secondary | ICD-10-CM | POA: Diagnosis not present

## 2020-06-27 DIAGNOSIS — G629 Polyneuropathy, unspecified: Secondary | ICD-10-CM | POA: Diagnosis not present

## 2020-06-27 DIAGNOSIS — E039 Hypothyroidism, unspecified: Secondary | ICD-10-CM | POA: Diagnosis not present

## 2020-06-27 DIAGNOSIS — K219 Gastro-esophageal reflux disease without esophagitis: Secondary | ICD-10-CM | POA: Diagnosis not present

## 2020-06-27 LAB — CBC WITH DIFFERENTIAL/PLATELET
Abs Immature Granulocytes: 0.06 10*3/uL (ref 0.00–0.07)
Basophils Absolute: 0 10*3/uL (ref 0.0–0.1)
Basophils Relative: 0 %
Eosinophils Absolute: 0.2 10*3/uL (ref 0.0–0.5)
Eosinophils Relative: 2 %
HCT: 28.7 % — ABNORMAL LOW (ref 36.0–46.0)
Hemoglobin: 9.6 g/dL — ABNORMAL LOW (ref 12.0–15.0)
Immature Granulocytes: 1 %
Lymphocytes Relative: 18 %
Lymphs Abs: 1.9 10*3/uL (ref 0.7–4.0)
MCH: 29.9 pg (ref 26.0–34.0)
MCHC: 33.4 g/dL (ref 30.0–36.0)
MCV: 89.4 fL (ref 80.0–100.0)
Monocytes Absolute: 0.7 10*3/uL (ref 0.1–1.0)
Monocytes Relative: 7 %
Neutro Abs: 7.7 10*3/uL (ref 1.7–7.7)
Neutrophils Relative %: 72 %
Platelets: 195 10*3/uL (ref 150–400)
RBC: 3.21 MIL/uL — ABNORMAL LOW (ref 3.87–5.11)
RDW: 14 % (ref 11.5–15.5)
WBC: 10.6 10*3/uL — ABNORMAL HIGH (ref 4.0–10.5)
nRBC: 0 % (ref 0.0–0.2)

## 2020-06-27 LAB — BASIC METABOLIC PANEL
Anion gap: 10 (ref 5–15)
BUN: 14 mg/dL (ref 8–23)
CO2: 21 mmol/L — ABNORMAL LOW (ref 22–32)
Calcium: 8 mg/dL — ABNORMAL LOW (ref 8.9–10.3)
Chloride: 103 mmol/L (ref 98–111)
Creatinine, Ser: 1.12 mg/dL — ABNORMAL HIGH (ref 0.44–1.00)
GFR, Estimated: 56 mL/min — ABNORMAL LOW (ref >60)
Glucose, Bld: 166 mg/dL — ABNORMAL HIGH (ref 70–99)
Potassium: 3.8 mmol/L (ref 3.5–5.1)
Sodium: 134 mmol/L — ABNORMAL LOW (ref 135–145)

## 2020-06-27 LAB — GLUCOSE, CAPILLARY
Glucose-Capillary: 142 mg/dL — ABNORMAL HIGH (ref 70–99)
Glucose-Capillary: 176 mg/dL — ABNORMAL HIGH (ref 70–99)
Glucose-Capillary: 283 mg/dL — ABNORMAL HIGH (ref 70–99)
Glucose-Capillary: 218 mg/dL — ABNORMAL HIGH (ref 70–99)

## 2020-06-27 LAB — CULTURE, BLOOD (ROUTINE X 2): Special Requests: ADEQUATE

## 2020-06-27 MED ORDER — LOSARTAN POTASSIUM 50 MG PO TABS
100.0000 mg | ORAL_TABLET | Freq: Every day | ORAL | Status: DC
  Administered 2020-06-27 – 2020-06-28 (×2): 100 mg via ORAL
  Filled 2020-06-27 (×2): qty 2

## 2020-06-27 MED ORDER — LOSARTAN POTASSIUM-HCTZ 100-25 MG PO TABS: 1.0000 | ORAL_TABLET | Freq: Every day | ORAL | Status: DC

## 2020-06-27 MED ORDER — AMITRIPTYLINE HCL 25 MG PO TABS
25.0000 mg | ORAL_TABLET | Freq: Every day | ORAL | Status: DC
  Administered 2020-06-27: 25 mg via ORAL
  Filled 2020-06-27: qty 1

## 2020-06-27 MED ORDER — IOHEXOL 9 MG/ML PO SOLN
1000.0000 mL | ORAL | Status: AC
  Administered 2020-06-27: 1000 mL via ORAL

## 2020-06-27 MED ORDER — OXYCODONE HCL 5 MG PO TABS
5.0000 mg | ORAL_TABLET | ORAL | Status: DC | PRN
  Administered 2020-06-27 – 2020-06-28 (×3): 5 mg via ORAL
  Filled 2020-06-27 (×4): qty 1

## 2020-06-27 MED ORDER — FUROSEMIDE 10 MG/ML IJ SOLN
40.0000 mg | Freq: Once | INTRAMUSCULAR | Status: AC
  Administered 2020-06-27: 14:00:00 40 mg via INTRAVENOUS
  Filled 2020-06-27: qty 4

## 2020-06-27 MED ORDER — FUROSEMIDE 20 MG PO TABS
20.0000 mg | ORAL_TABLET | Freq: Once | ORAL | Status: AC
  Administered 2020-06-27: 20 mg via ORAL
  Filled 2020-06-27: qty 1

## 2020-06-27 MED ORDER — IOHEXOL 300 MG/ML  SOLN
100.0000 mL | Freq: Once | INTRAMUSCULAR | Status: AC | PRN
  Administered 2020-06-27: 100 mL via INTRAVENOUS

## 2020-06-27 MED ORDER — HYDROCHLOROTHIAZIDE 25 MG PO TABS
25.0000 mg | ORAL_TABLET | Freq: Every day | ORAL | Status: DC
  Administered 2020-06-27 – 2020-06-28 (×2): 25 mg via ORAL
  Filled 2020-06-27 (×2): qty 1

## 2020-06-27 MED ORDER — IOHEXOL 9 MG/ML PO SOLN
ORAL | Status: AC
  Filled 2020-06-27: qty 1000

## 2020-06-27 MED ORDER — AMOXICILLIN-POT CLAVULANATE 875-125 MG PO TABS
1.0000 | ORAL_TABLET | Freq: Two times a day (BID) | ORAL | Status: DC
  Administered 2020-06-27 – 2020-06-28 (×3): 1 via ORAL
  Filled 2020-06-27 (×4): qty 1

## 2020-06-27 NOTE — Progress Notes (Signed)
Secure chat with Joylene John, NP regarding PICC order.  Aware that PICC will not be able to be placed right away and possibly won't be done until after 7pm.  Midline placement offered.  Per Elinor Parkinson, NP patient has IV access at this time and it will be okay to wait until that time.

## 2020-06-27 NOTE — Progress Notes (Addendum)
GYN Oncology Progress Note   Subjective: Patient reports slight improvement in lower abdominal cramping. Pressure in that area remains. She received two doses of IV dilaudid overnight for the discomfort. Reports episode of emesis after solid food yesterday and has not taken in anything solid since that time except for liquids. No nausea this am but reporting decreased appetite. Passing small amount of flatus. No bowel movement reported since episodes of diarrhea. Slight decrease in vaginal discharge and states it has an odor. Still reports dizziness when out of bed. Voiding without difficulty. Reporting decrease in shortness of breath after lasix administration yesterday. Denies chest pain. Discussed plan per Dr. Denman George for another dose of lasix and changing IV abx to Augmentin. No concerns voiced.  Objective: Vital signs in last 24 hours: Temp:  [98.8 F (37.1 C)-98.9 F (37.2 C)] 98.9 F (37.2 C) (11/11 0538) Pulse Rate:  [86-102] 86 (11/11 0538) Resp:  [16-18] 16 (11/11 0538) BP: (125-154)/(66-77) 143/77 (11/11 0538) SpO2:  [96 %-98 %] 96 % (11/11 0538) Weight:  [211 lb (95.7 kg)-212 lb 1.3 oz (96.2 kg)] 212 lb 1.3 oz (96.2 kg) (11/11 0538) Last BM Date: 06/25/20  Intake/Output from previous day: 11/10 0701 - 11/11 0700 In: 630 [P.O.:480; IV Piggyback:150] Out: 3650 [Urine:3650]  Physical Examination: General: alert, cooperative and no distress Resp: Slightly diminished in the bases Cardio: regular rate and rhythm, S1, S2 normal, no murmur, click, rub or gallop GI: incision: lap sites to the abdomen with dermabond without erythema or drainage and abdomen rounded, firm (similar to pre-op status), hypoactive bowel sounds, mildly tympanic in upper quadrants Extremities: BLE edema, pitting 1+  Labs: WBC/Hgb/Hct/Plts:  10.6/9.6/28.7/195 (11/11 0503) BUN/Cr/glu/ALT/AST/amyl/lip:  14/1.12/--/--/--/--/-- (11/11 0503)  Assessment: 64 y.o. with grade 1 endometrial adenocarcinoma in the  setting of obesity, type 2 poorly controlled diabetes mellitus, s/p robotic hysterectomy, BSO sentinel lymph node biopsy and right ureterolysis on 06/20/20. Now admitted with SIRS, a pelvic fluid collection, and GI symptoms concerning for ileus.   Pain:  Pain is well-controlled on PRN medications.  Heme: Hgb 9.6 and Hct 28.7 this am- overall stable.   ID: Currently on IV Zosyn for presumed pelvic abscess/SIRS-Plan for change to oral Augmentin. WBC 10.6 this am. Urine culture from ED without growth. Blood culture ID panel from 06/23/20 positive for bacteroides fragilis.  CV: Tachycardia improved. BP increasing-Hx HTN. Hyzaar reordered per Dr. Denman George.  GI:  Tolerating po: Liquids, emesis after solid food 06/26/20. Antiemetics ordered as needed. C diff negative-precautions discontinued. CT CAP from  06/24/20:  IMPRESSION: 1. No central or lobar pulmonary artery emboli. More distal evaluation limited by respiratory motion artifact. 2. Postsurgical changes from recent total abdominal hysterectomy and bilateral salpingo-oophorectomy with a lobular collection of air and fluid about the surgical site/vaginal cuff some of which is demonstrating some early peripheral rim enhancement which may reflect a developing abscess with associated thickening of the adjacent colon and bladder. Injury of bowel bladder is difficult to fully exclude the absence of luminal contrast media. 3. Asymmetric right urinary tract dilatation with mild urothelial thickening and periureteral hazy stranding which courses into the vicinity of postsurgical changes present in the deep pelvis and collection above. No visible obstructive urolithiasis is seen. Findings could reflect inflammatory stricture secondary to the above process. Furthermore could correlate with urinalysis to exclude concomitant urinary tract infection. 4. New 3 mm nodule in the periphery of the left upper lobe. Consider three to six-month follow-up imaging given patient's  history of known malignancy. 5. 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 6. Hepatic steatosis.  GU: Creatinine 1.12 this am. Adequate urine output reported. S/P lasix administration on 06/26/20 for ? volume overload.  FEN: K+ 3.8 this am from 3.2 yesterday-replacement ordered per Hospitalist Team. Mag 1.9 on 11/10 and Ca+ 8  Endo: Diabetes mellitus Type II, under fair control.  CBG:  CBG (last 3)  Recent Labs    06/26/20 1632 06/26/20 2212 06/27/20 0814  GLUCAP 150* 189* 142*    Prophylaxis: Heparin ordered Q8H.  Plan: Plan for discontinuation of IV antibiotic and start Augmentin PO. Additional dose of lasix ordered per Dr. Denman George Resume home medications- HTN med ordered, pt to try solid food this am Oxycodone ordered for pain PRN Ambulate in the halls minimum of 3-4 times Appreciate continued Hospitalist management Continue plan of care per Dr. Denman George and Hospitalist Team Plan for probable discharge tomorrow   LOS: 3 days    Christy Carpenter 06/27/2020, 9:21 AM

## 2020-06-27 NOTE — Progress Notes (Signed)
Consult/PROGRESS NOTE    Christy Carpenter  NUU:[id number] DOB: 25-May-1956 DOA: 06/23/2020 PCP: [name], MD    Chief Complaint  Patient presents with  . 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  Hospitalist service was consulted to help with diabetes management.  Patient just received IV Dilaudid and is slightly drowsy and a poor historian at time of hospitalist evaluation.  She denies current nausea or vomiting.  States that her pain is well controlled at this time.  She has had fevers and chills along with some nausea and vomiting over the last few days.   Assessment & Plan:   Principal Problem:   SIRS due to infectious process with acute organ dysfunction [facility]) Active Problems:   Uncontrolled type 2 diabetes mellitus with hyperglycemia (HCC)   Endometrial cancer (HCC)   Obesity (BMI 30.0-34.9)   SIRS (systemic inflammatory response syndrome) (HCC)   Hypothyroidism   Gastroesophageal reflux disease   Neuropathy   Pelvic fluid collection   Type 2 diabetes mellitus with hyperlipidemia (HCC)  1 diabetes mellitus type 2  Hemoglobin A1c 8.2 (06/11/2020).  Patient normally uses Novolin R 75 units 3 times a day, Novolin 75 units 3 times a day with Janumet and [place].  Patient placed back on a carb modified diet per primary service.  Patient with a bout of emesis this morning.  Hemoglobin A1c 8.2 (06/11/2020).  CBG at 142 this morning. Continue current regimen of sliding scale insulin.  As oral intake improves if worsening hyperglycemia with fasting blood glucose levels > 180 we will start Lantus 15 units daily.  Continue  sliding scale insulin.  Once patient is stable for discharge will recommend resumption of home regimen of insulin with close outpatient follow-up with endocrinologist.  Diabetes coordinator following.  Supportive care.    2.  Pelvic fluid collection/possible pelvic abscess Status post recent robotic hysterectomy, BSO sentinel lymph node biopsy and right ureterolysis on 06/20/2020 for grade 1 endometrial adenocarcinoma.  Patient currently was on IV Zosyn and transitioned to oral Augmentin today.  Patient with emesis early on today.  CT abdomen and pelvis to be ordered per primary team... Per primary GYN team.  3.  Hypothyroidism Patient transitioned from IV Synthroid to oral Synthroid.   4.  Hyperlipidemia Continue to hold statin, fenofibrate.  Resume on discharge.  5.  Gastroesophageal reflux disease Continue PPI.   6.  Neuropathy of both feet Continue Lyrica.  7.  Dyspnea/? Volume overload Patient with some complaints of dyspnea on 06/26/2020, slightly worse than her baseline.  Concern for possible volume overload.  Patient aggressively hydrated on admission.  Patient placed on Lasix 20 mg IV x2 doses yesterday with a urine output of 3.650 L over the past 24 hours.  Patient still volume overloaded on examination.  Patient received Lasix 20 mg oral x1 today.  We will give Lasix 40 mg IV x1.  Strict I's and O's.  Daily weights.   8.  Hypokalemia Repleted.  Potassium at 3.8.    DVT prophylaxis: Heparin Code Status: Full Family Communication: Updated patient and husband at bedside. Disposition:   Status is: Inpatient    Dispo: The patient is from: Home  Anticipated d/c is to: Home              Anticipated d/c date is: Per primary team              Patient currently with ongoing abdominal pain, being managed by primary team.  Volume overloaded on examination.  Disposition per primary team.         Consultants:   [facility] hospitalist: Dr. Starla Link 06/24/2020  Procedures:    CT abdomen and pelvis 06/24/2020  CT angiogram chest 06/24/2020  Chest x-ray 06/23/2020    Antimicrobials:   IV cefepime 11/7/2021x1 dose  IV vancomycin 11/7/2021x1 dose  IV Zosyn 06/24/2020 >>>>> 06/27/2020  IV Flagyl 11/7/2021x1 dose  Augmentin 06/27/2020>>>   Subjective: Patient just got off bedside commode.  States some improvement with shortness of breath after diuretics yesterday.  Has not had a bowel movement in 2 days.  Complaining of diffuse abdominal pain.  Hesitant to get PICC line placed.  States primary team concerned of possible ileus.  No chest pain.  Husband at bedside.  Patient stated had a bout of emesis today after trying oral intake.  Objective: Vitals:   06/26/20 1300 06/26/20 1418 06/26/20 2210 06/27/20 0538  BP:  125/66 (!) 154/69 (!) 143/77  Pulse:  89 (!) 102 86  Resp:  18 17 16   Temp:  98.8 F (37.1 C) 98.8 F (37.1 C) 98.9 F (37.2 C)  TempSrc:  Oral  Oral  SpO2:  98% 97% 96%  Weight: 95.7 kg   96.2 kg  Height:        Intake/Output Summary (Last 24 hours) at 06/27/2020 1333 Last data filed at 06/27/2020 1212 Gross per 24 hour  Intake 820 ml  Output 5301 ml  Net -4481 ml   Filed Weights   06/23/20 2145 06/26/20 1300 06/27/20 0538  Weight: 87.6 kg 95.7 kg 96.2 kg    Examination:  General exam: NAD Respiratory system: Some decreased breath sounds in the bases.  No rhonchi.  Speaking in full sentences.  Normal respiratory effort.   Cardiovascular system: RRR no murmurs rubs or gallops.  No JVD.  1+ bilateral lower extremity edema.  Gastrointestinal system: Abdomen mildly distended, soft, positive bowel sounds, diffuse tenderness to palpation. Central nervous system: Alert and oriented. No focal neurological deficits. Extremities: Symmetric 5 x 5 power. Skin: No rashes, lesions or ulcers Psychiatry: Judgement and insight appear normal. Mood & affect appropriate.     Data Reviewed: I have personally reviewed following labs and  imaging studies  CBC: Recent Labs  Lab 06/23/20 2147 06/24/20 1654 06/25/20 1118 06/26/20 0501 06/27/20 0503  WBC 8.3 12.6* 10.9* 10.1 10.6*  NEUTROABS 7.4 10.5* 9.3* 8.1* 7.7  HGB 10.7* 9.9* 8.8* 8.4* 9.6*  HCT 31.9* 30.2* 26.8* 24.5* 28.7*  MCV 88.4 92.4 91.5 87.5 89.4  PLT 191 178 173 178 353    Basic Metabolic Panel: Recent Labs  Lab 06/23/20 2147 06/24/20 0800 06/25/20 1118 06/26/20 0501 06/27/20 0503  NA 135 136 138 134* 134*  K 3.5 3.8 3.5 3.2* 3.8  CL 107 105 108 107 103  CO2 18* 19* 19* 18* 21*  GLUCOSE 160* 89 117* 119* 166*  BUN 18 19 19 17 14   CREATININE 1.18* 1.17* 1.15* 1.01* 1.12*  CALCIUM 8.7* 8.7* 9.0 7.9* 8.0*  MG  --   --   --  1.9  --     GFR: Estimated Creatinine Clearance: 54.1 mL/min (A) (by C-G formula based on SCr of 1.12  mg/dL (H)).  Liver Function Tests: Recent Labs  Lab 06/23/20 2147  AST 25  ALT 24  ALKPHOS 118  BILITOT 0.8  PROT 7.5  ALBUMIN 3.5    CBG: Recent Labs  Lab 06/26/20 1151 06/26/20 1632 06/26/20 2212 06/27/20 0814 06/27/20 1209  GLUCAP 109* 150* 189* 142* 176*     Recent Results (from the past 240 hour(s))  Blood culture (routine x 2)     Status: None (Preliminary result)   Collection Time: 06/23/20  9:47 PM   Specimen: BLOOD  Result Value Ref Range Status   Specimen Description   Final    BLOOD LEFT ANTECUBITAL Performed at [facility], [place] [address], [place], [place] [address]    Special Requests   Final    BOTTLES DRAWN AEROBIC AND ANAEROBIC Blood Culture adequate volume Performed at [facility] [address], [place], [place] [address]    Culture   Final    NO GROWTH 4 DAYS Performed at [facility], [place] [address], [place], [place] [address]    Report Status PENDING  Incomplete  Respiratory Panel by RT PCR (Flu A&B, Covid) - Nasopharyngeal Swab     Status: None   Collection Time: 06/23/20  9:54 PM   Specimen: Nasopharyngeal Swab  Result  Value Ref Range Status   SARS Coronavirus 2 by RT PCR NEGATIVE NEGATIVE Final    Comment: (NOTE) SARS-CoV-2 target nucleic acids are NOT DETECTED.  The SARS-CoV-2 RNA is generally detectable in upper respiratoy specimens during the acute phase of infection. The lowest concentration of SARS-CoV-2 viral copies this assay can detect is 131 copies/mL. A negative result does not preclude SARS-Cov-2 infection and should not be used as the sole basis for treatment or other patient management decisions. A negative result may occur with  improper specimen collection/handling, submission of specimen other than nasopharyngeal swab, presence of viral mutation(s) within the areas targeted by this assay, and inadequate number of viral copies (<131 copies/mL). A negative result must be combined with clinical observations, patient history, and epidemiological information. The expected result is Negative.  Fact Sheet for Patients:  PinkCheek.be  Fact Sheet for Healthcare Providers:  GravelBags.it  This test is no t yet approved or cleared by the [place] FDA and  has been authorized for detection and/or diagnosis of SARS-CoV-2 by FDA under an Emergency Use Authorization (EUA). This EUA will remain  in effect (meaning this test can be used) for the duration of the COVID-19 declaration under Section 564(b)(1) of the Act, 21 U.S.C. section 360bbb-3(b)(1), unless the authorization is terminated or revoked sooner.     Influenza A by PCR NEGATIVE NEGATIVE Final   Influenza B by PCR NEGATIVE NEGATIVE Final    Comment: (NOTE) The Xpert Xpress SARS-CoV-2/FLU/RSV assay is intended as an aid in  the diagnosis of influenza from Nasopharyngeal swab specimens and  should not be used as a sole basis for treatment. Nasal washings and  aspirates are unacceptable for Xpert Xpress SARS-CoV-2/FLU/RSV  testing.  Fact Sheet for  Patients: PinkCheek.be  Fact Sheet for Healthcare Providers: GravelBags.it  This test is not yet approved or cleared by the [place] FDA and  has been authorized for detection and/or diagnosis of SARS-CoV-2 by  FDA under an Emergency Use Authorization (EUA). This EUA will remain  in effect (meaning this test can be used) for the duration of the  Covid-19 declaration under Section 564(b)(1) of the Act, 21  U.S.C. section 360bbb-3(b)(1),  unless the authorization is  terminated or revoked. Performed at [facility], [place] [address], [place], [place] [address]   Blood culture (routine x 2)     Status: Abnormal   Collection Time: 06/23/20 10:36 PM   Specimen: BLOOD  Result Value Ref Range Status   Specimen Description   Final    BLOOD LEFT ANTECUBITAL Performed at [facility] [address] [place], [place] [address]    Special Requests   Final    BOTTLES DRAWN AEROBIC AND ANAEROBIC Blood Culture adequate volume Performed at [facility] [address], [place], [place] [address]    Culture  Setup Time   Final    GRAM NEGATIVE RODS ANAEROBIC BOTTLE ONLY Organism ID to follow CRITICAL RESULT CALLED TO, READ BACK BY AND VERIFIED WITH: Melodye Ped PharmD 10:55 06/25/20 ([email or web address])    Culture (A)  Final    BACTEROIDES FRAGILIS BETA LACTAMASE POSITIVE Performed at [facility], [address] [place], [place] [address]    Report Status 06/27/2020 FINAL  Final  Blood Culture ID Panel (Reflexed)     Status: Abnormal   Collection Time: 06/23/20 10:36 PM  Result Value Ref Range Status   Enterococcus faecalis NOT DETECTED NOT DETECTED Final   Enterococcus Faecium NOT DETECTED NOT DETECTED Final   Listeria monocytogenes NOT DETECTED NOT DETECTED Final   Staphylococcus species NOT DETECTED NOT DETECTED Final   Staphylococcus aureus (BCID) NOT DETECTED NOT DETECTED  Final   Staphylococcus epidermidis NOT DETECTED NOT DETECTED Final   Staphylococcus lugdunensis NOT DETECTED NOT DETECTED Final   Streptococcus species NOT DETECTED NOT DETECTED Final   Streptococcus agalactiae NOT DETECTED NOT DETECTED Final   Streptococcus pneumoniae NOT DETECTED NOT DETECTED Final   Streptococcus pyogenes NOT DETECTED NOT DETECTED Final   A.calcoaceticus-baumannii NOT DETECTED NOT DETECTED Final   Bacteroides fragilis DETECTED (A) NOT DETECTED Final    Comment: CRITICAL RESULT CALLED TO, READ BACK BY AND VERIFIED WITH: Melodye Ped PharmD 10:55 06/25/20 ([email or web address])    Enterobacterales NOT DETECTED NOT DETECTED Final   Enterobacter cloacae complex NOT DETECTED NOT DETECTED Final   Escherichia coli NOT DETECTED NOT DETECTED Final   Klebsiella aerogenes NOT DETECTED NOT DETECTED Final   Klebsiella oxytoca NOT DETECTED NOT DETECTED Final   Klebsiella pneumoniae NOT DETECTED NOT DETECTED Final   Proteus species NOT DETECTED NOT DETECTED Final   Salmonella species NOT DETECTED NOT DETECTED Final   Serratia marcescens NOT DETECTED NOT DETECTED Final   Haemophilus influenzae NOT DETECTED NOT DETECTED Final   Neisseria meningitidis NOT DETECTED NOT DETECTED Final   Pseudomonas aeruginosa NOT DETECTED NOT DETECTED Final   Stenotrophomonas maltophilia NOT DETECTED NOT DETECTED Final   Candida albicans NOT DETECTED NOT DETECTED Final   Candida auris NOT DETECTED NOT DETECTED Final   Candida glabrata NOT DETECTED NOT DETECTED Final   Candida krusei NOT DETECTED NOT DETECTED Final   Candida parapsilosis NOT DETECTED NOT DETECTED Final   Candida tropicalis NOT DETECTED NOT DETECTED Final   Cryptococcus neoformans/gattii NOT DETECTED NOT DETECTED Final    Comment: Performed at [facility], [place] [address], [place], [place] [address]  Urine culture     Status: None   Collection Time: 06/24/20  1:09 AM   Specimen: In/Out Cath Urine  Result Value Ref Range Status   Specimen  Description   Final    IN/OUT CATH URINE Performed at [facility] [address]  [place], [place] [address]    Special Requests   Final    NONE Performed at [facility], [place] [address] [place], [place] [address]    Culture   Final    NO GROWTH Performed at [facility], [place] [address], [place], [place] [address]    Report Status 06/25/2020 FINAL  Final  C Difficile Quick Screen w PCR reflex     Status: None   Collection Time: 06/25/20  7:00 PM   Specimen: STOOL  Result Value Ref Range Status   C Diff antigen NEGATIVE NEGATIVE Final   C Diff toxin NEGATIVE NEGATIVE Final   C Diff interpretation No C. difficile detected.  Final    Comment: Performed at [facility], [place] [address] [place], [place] [address]         Radiology Studies: [place] EKG SITE RITE  Result Date: 06/27/2020 If Site Rite image not attached, placement could not be confirmed due to current cardiac rhythm.       Scheduled Meds: . amitriptyline  25 mg Oral QHS  . amoxicillin-clavulanate  1 tablet Oral Q12H  . heparin  5,000 Units Subcutaneous Q8H  . losartan  100 mg Oral Daily   And  . hydrochlorothiazide  25 mg Oral Daily  . insulin aspart  0-20 Units Subcutaneous TID WC  . levothyroxine  50 mcg Oral Q0600  . pantoprazole  40 mg Oral Daily  . pregabalin  150 mg Oral TID  . sodium bicarbonate  650 mg Oral BID   Continuous Infusions:    LOS: 3 days    Time spent: 35 minutes    Irine Seal, MD [facility]   To contact the attending provider between 7A-7P or the covering provider during after hours 7P-7A, please log into the web site [email or web address] and access using universal [place] password for that web site. If you do not have the password, please call the hospital operator.  06/27/2020, 1:33 PM

## 2020-06-27 NOTE — Progress Notes (Signed)
GYN ONC Progress Note  Called patient to check on status. She is currently drinking her oral contrast for her CT scan. She states she has not thrown up since this am with breakfast. She was able to tolerate her afternoon oral medications. No concerns or needs voiced.

## 2020-06-27 NOTE — Progress Notes (Signed)
GYN ONC Progress Note  Patient resting in bed in no acute distress with husband at the bedside. She states she only took in 1/3 of breakfast and had an episode of emesis.  For lunch, she has taken in a bagel and broth and it has stayed down after 30 minutes. No nausea reported at this time. She states she took her 8 am meds and tolerated them well. She does not want to proceed with PICC line placement at this time. Discussed the role of the PICC line. After discussion, plan will be for the patient to proceed with the recommendation CT scan per Dr. Denman George to evaluate for any changes from her recent CT and evaluate for ureteral/bowel injury. Advised we would follow up with her after the results and discuss recommendations moving forward. She feels she can take the oral antibiotics at this time and would like to maintain current status (orders etc) until after CT scan and to see if she is able to tolerate her lunch. All questions answered.

## 2020-06-27 NOTE — Progress Notes (Signed)
NP secure chatted for PO pain med.

## 2020-06-28 DIAGNOSIS — G629 Polyneuropathy, unspecified: Secondary | ICD-10-CM | POA: Diagnosis not present

## 2020-06-28 DIAGNOSIS — R7881 Bacteremia: Secondary | ICD-10-CM

## 2020-06-28 DIAGNOSIS — K219 Gastro-esophageal reflux disease without esophagitis: Secondary | ICD-10-CM | POA: Diagnosis not present

## 2020-06-28 DIAGNOSIS — E1165 Type 2 diabetes mellitus with hyperglycemia: Secondary | ICD-10-CM | POA: Diagnosis not present

## 2020-06-28 DIAGNOSIS — E039 Hypothyroidism, unspecified: Secondary | ICD-10-CM | POA: Diagnosis not present

## 2020-06-28 HISTORY — DX: Bacteremia: R78.81

## 2020-06-28 LAB — CBC WITH DIFFERENTIAL/PLATELET
Abs Immature Granulocytes: 0.21 10*3/uL — ABNORMAL HIGH (ref 0.00–0.07)
Basophils Absolute: 0.1 10*3/uL (ref 0.0–0.1)
Basophils Relative: 1 %
Eosinophils Absolute: 0.3 10*3/uL (ref 0.0–0.5)
Eosinophils Relative: 3 %
HCT: 28.7 % — ABNORMAL LOW (ref 36.0–46.0)
Hemoglobin: 9.5 g/dL — ABNORMAL LOW (ref 12.0–15.0)
Immature Granulocytes: 2 %
Lymphocytes Relative: 31 %
Lymphs Abs: 3.7 10*3/uL (ref 0.7–4.0)
MCH: 29.1 pg (ref 26.0–34.0)
MCHC: 33.1 g/dL (ref 30.0–36.0)
MCV: 88 fL (ref 80.0–100.0)
Monocytes Absolute: 0.7 10*3/uL (ref 0.1–1.0)
Monocytes Relative: 6 %
Neutro Abs: 6.9 10*3/uL (ref 1.7–7.7)
Neutrophils Relative %: 57 %
Platelets: 239 10*3/uL (ref 150–400)
RBC: 3.26 MIL/uL — ABNORMAL LOW (ref 3.87–5.11)
RDW: 14.1 % (ref 11.5–15.5)
WBC: 11.9 10*3/uL — ABNORMAL HIGH (ref 4.0–10.5)
nRBC: 0.3 % — ABNORMAL HIGH (ref 0.0–0.2)

## 2020-06-28 LAB — BASIC METABOLIC PANEL
Anion gap: 12 (ref 5–15)
BUN: 13 mg/dL (ref 8–23)
CO2: 23 mmol/L (ref 22–32)
Calcium: 8.3 mg/dL — ABNORMAL LOW (ref 8.9–10.3)
Chloride: 103 mmol/L (ref 98–111)
Creatinine, Ser: 0.99 mg/dL (ref 0.44–1.00)
GFR, Estimated: >60 mL/min (ref >60)
Glucose, Bld: 185 mg/dL — ABNORMAL HIGH (ref 70–99)
Potassium: 3.5 mmol/L (ref 3.5–5.1)
Sodium: 138 mmol/L (ref 135–145)

## 2020-06-28 LAB — CULTURE, BLOOD (ROUTINE X 2)
Culture: NO GROWTH
Special Requests: ADEQUATE

## 2020-06-28 LAB — MAGNESIUM: Magnesium: 1.9 mg/dL (ref 1.7–2.4)

## 2020-06-28 LAB — GLUCOSE, CAPILLARY
Glucose-Capillary: 196 mg/dL — ABNORMAL HIGH (ref 70–99)
Glucose-Capillary: 242 mg/dL — ABNORMAL HIGH (ref 70–99)

## 2020-06-28 MED ORDER — HYDROCODONE-ACETAMINOPHEN 5-325 MG PO TABS
1.0000 | ORAL_TABLET | Freq: Four times a day (QID) | ORAL | 0 refills | Status: DC | PRN
Start: 2020-06-28 — End: 2020-07-08

## 2020-06-28 MED ORDER — AMOXICILLIN-POT CLAVULANATE 875-125 MG PO TABS: 1.0000 | ORAL_TABLET | Freq: Two times a day (BID) | ORAL | 0 refills | Status: DC

## 2020-06-28 MED ORDER — INSULIN GLARGINE 100 UNIT/ML ~~LOC~~ SOLN
8.0000 [IU] | Freq: Every day | SUBCUTANEOUS | Status: DC
  Administered 2020-06-28: 11:00:00 8 [IU] via SUBCUTANEOUS
  Filled 2020-06-28: qty 0.08

## 2020-06-28 MED ORDER — POTASSIUM CHLORIDE CRYS ER 20 MEQ PO TBCR
40.0000 meq | EXTENDED_RELEASE_TABLET | Freq: Once | ORAL | Status: AC
  Administered 2020-06-28: 40 meq via ORAL
  Filled 2020-06-28: qty 2

## 2020-06-28 MED ORDER — FUROSEMIDE 10 MG/ML IJ SOLN
40.0000 mg | Freq: Once | INTRAMUSCULAR | Status: AC
  Administered 2020-06-28: 40 mg via INTRAVENOUS
  Filled 2020-06-28: qty 4

## 2020-06-28 MED ORDER — AMOXICILLIN-POT CLAVULANATE 875-125 MG PO TABS: 1.0000 | ORAL_TABLET | Freq: Two times a day (BID) | ORAL | 0 refills | Status: AC

## 2020-06-28 NOTE — Discharge Instructions (Signed)
06/28/2020  Return to work: 4-6 weeks if applicable  Activity: 1. Be up and out of the bed during the day.  Take a nap if needed.  You may walk up steps but be careful and use the hand rail.  Stair climbing will tire you more than you think, you may need to stop part way and rest.   2. No lifting or straining for 6 weeks.  3. No driving for 1 week(s).  Do not drive if you are taking narcotic pain medicine.  4. Shower daily.  Use soap and water on your incision and pat dry; don't rub.  No tub baths until cleared by your surgeon.   5. No sexual activity and nothing in the vagina for 8 weeks.  6. You may experience a small amount of clear drainage from your incisions, which is normal.  If the drainage persists or increases, please call the office.  7. You may experience vaginal spotting after surgery or around the 6-8 week mark from surgery when the stitches at the top of the vagina begin to dissolve.  The spotting is normal but if you experience heavy bleeding, call our office.  8. Take Tylenol first for pain and only use narcotic pain medication for severe pain not relieved by the Tylenol.  Monitor your Tylenol intake to a max of 4,000 mg since you are taking hydrocodone/APAP for pain.  Diet: 1. Low sodium Heart Healthy Diet is recommended.  2. It is safe to use a laxative, such as Miralax or Colace, if you have difficulty moving your bowels. You can take Sennakot at bedtime every evening to keep bowel movements regular and to prevent constipation.    Wound Care: 1. Keep clean and dry.  Shower daily.  Reasons to call the Doctor:  Fever - Oral temperature greater than 100.4 degrees Fahrenheit  Foul-smelling vaginal discharge  Difficulty urinating  Nausea and vomiting  Increased pain at the site of the incision that is unrelieved with pain medicine.  Difficulty breathing with or without chest pain  New calf pain especially if only on one side  Sudden, continuing increased  vaginal bleeding with or without clots.   Contacts: For questions or concerns you should contact:  Dr. [name] at [phone]  Joylene John, NP at [phone]  After Hours: call [phone] and have the GYN Oncologist paged/contacted

## 2020-06-28 NOTE — Progress Notes (Signed)
Consult/PROGRESS NOTE    Christy Carpenter  NWG:[id number] DOB: November 21, 1955 DOA: 06/23/2020 PCP: [name], MD    Chief Complaint  Patient presents with  . 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  Hospitalist service was consulted to help with diabetes management.  Patient just received IV Dilaudid and is slightly drowsy and a poor historian at time of hospitalist evaluation.  She denies current nausea or vomiting.  States that her pain is well controlled at this time.  She has had fevers and chills along with some nausea and vomiting over the last few days.   Assessment & Plan:   Principal Problem:   SIRS due to infectious process with acute organ dysfunction [facility]) Active Problems:   Uncontrolled type 2 diabetes mellitus with hyperglycemia (HCC)   Endometrial cancer (HCC)   Obesity (BMI 30.0-34.9)   SIRS (systemic inflammatory response syndrome) (HCC)   Hypothyroidism   Gastroesophageal reflux disease   Neuropathy   Pelvic fluid collection   Type 2 diabetes mellitus with hyperlipidemia (HCC)  1 diabetes mellitus type 2  Hemoglobin A1c 8.2 (06/11/2020).  Patient normally uses Novolin R 75 units 3 times a day, Novolin 75 units 3 times a day with Janumet and [place].  Patient placed back on a carb modified diet per primary service.  Patient with a bout of emesis this morning.  Hemoglobin A1c 8.2 (06/11/2020).  CBG at 196 this morning.  Start Lantus.  Sliding scale insulin.  Resume home regimen of insulin on discharge.  Outpatient follow-up with PCP.    2.  Pelvic fluid collection/possible pelvic abscess Status post recent  robotic hysterectomy, BSO sentinel lymph node biopsy and right ureterolysis on 06/20/2020 for grade 1 endometrial adenocarcinoma.  Patient currently was on IV Zosyn and transitioned to oral Augmentin.  CT abdomen and pelvis done per primary team.  Per primary team.    3.  Hypothyroidism Continue Synthroid.    4.  Hyperlipidemia Continue to hold statin, fenofibrate.  Resume on discharge.  5.  Gastroesophageal reflux disease PPI.  6.  Neuropathy of both feet Continue Lyrica.  7.  Dyspnea/? Volume overload Patient with some complaints of dyspnea on 06/26/2020, slightly worse than her baseline.  Concern for possible volume overload.  Patient aggressively hydrated on admission.  Patient placed on IV Lasix with good urine output and clinical improvement.  Patient with a urine output of 4.3 L over the past 24 hours.  Patient is -6.375 L during this hospitalization.  We will give another dose of Lasix 40 mg IV x1 today.  Outpatient follow-up with PCP.   8.  Hypokalemia Repleted.  Potassium at 3.5.  Patient given a dose of oral potassium.   DVT prophylaxis: Heparin Code Status: Full Family Communication: Updated patient and daughter at bedside.  Disposition:   Status is: Inpatient    Dispo: The patient is from: Home              Anticipated d/c is to: Home              Anticipated d/c date is: Today              Patient to be discharged per primary team  today.         Consultants:   [facility] hospitalist: Dr. Starla Link 06/24/2020  Procedures:   CT abdomen and pelvis 06/24/2020  CT angiogram chest 06/24/2020  Chest x-ray 06/23/2020    Antimicrobials:   IV cefepime 11/7/2021x1 dose  IV vancomycin 11/7/2021x1 dose  IV Zosyn 06/24/2020 >>>>> 06/27/2020  IV Flagyl 11/7/2021x1 dose  Augmentin 06/27/2020>>>   Subjective: Patient sitting up at the side of the bed dressed.  States she is feeling better after diuretics.  Still feels some swelling in her lower extremities.  Stated  she tolerated oral diet.  States she is to be discharged today.  Objective: Vitals:   06/27/20 1410 06/27/20 2145 06/28/20 0535 06/28/20 0925  BP: 126/75 125/66 121/67   Pulse: 90 93 93   Resp: 20 20 20    Temp: 98.6 F (37 C) 98.7 F (37.1 C) 98.9 F (37.2 C)   TempSrc: Oral Oral Oral   SpO2: 96% 97% 96%   Weight:    91.9 kg  Height:        Intake/Output Summary (Last 24 hours) at 06/28/2020 1321 Last data filed at 06/28/2020 1229 Gross per 24 hour  Intake 700 ml  Output 3550 ml  Net -2850 ml   Filed Weights   06/26/20 1300 06/27/20 0538 06/28/20 0925  Weight: 95.7 kg 96.2 kg 91.9 kg    Examination:  General exam: NAD Respiratory system: Decreased breath sounds in the bases.  No crackles.  No rhonchi.  Speaking in full sentences.  Cardiovascular system: Regular rate rhythm no murmurs rubs or gallops.  No JVD.  1+ bilateral lower extremity edema.  Gastrointestinal system: Abdomen is less distended, soft, positive bowel sounds, decreased tenderness to palpation diffusely.   Central nervous system: Alert and oriented. No focal neurological deficits. Extremities: Symmetric 5 x 5 power. Skin: No rashes, lesions or ulcers Psychiatry: Judgement and insight appear normal. Mood & affect appropriate.     Data Reviewed: I have personally reviewed following labs and imaging studies  CBC: Recent Labs  Lab 06/24/20 1654 06/25/20 1118 06/26/20 0501 06/27/20 0503 06/28/20 0459  WBC 12.6* 10.9* 10.1 10.6* 11.9*  NEUTROABS 10.5* 9.3* 8.1* 7.7 6.9  HGB 9.9* 8.8* 8.4* 9.6* 9.5*  HCT 30.2* 26.8* 24.5* 28.7* 28.7*  MCV 92.4 91.5 87.5 89.4 88.0  PLT 178 173 178 195 191    Basic Metabolic Panel: Recent Labs  Lab 06/24/20 0800 06/25/20 1118 06/26/20 0501 06/27/20 0503 06/28/20 0459  NA 136 138 134* 134* 138  K 3.8 3.5 3.2* 3.8 3.5  CL 105 108 107 103 103  CO2 19* 19* 18* 21* 23  GLUCOSE 89 117* 119* 166* 185*  BUN 19 19 17 14 13   CREATININE 1.17* 1.15* 1.01* 1.12*  0.99  CALCIUM 8.7* 9.0 7.9* 8.0* 8.3*  MG  --   --  1.9  --  1.9    GFR: Estimated Creatinine Clearance: 59.6 mL/min (by C-G formula based on SCr of 0.99 mg/dL).  Liver Function Tests: Recent Labs  Lab 06/23/20 2147  AST 25  ALT 24  ALKPHOS 118  BILITOT 0.8  PROT 7.5  ALBUMIN 3.5    CBG: Recent Labs  Lab 06/27/20 1209 06/27/20 1657 06/27/20 2147 06/28/20 0748 06/28/20 1221  GLUCAP 176* 283* 218* 196* 242*     Recent Results (from the past 240 hour(s))  Blood culture (routine x 2)     Status: None   Collection Time: 06/23/20  9:47 PM   Specimen: BLOOD  Result Value Ref Range Status   Specimen Description   Final    BLOOD LEFT ANTECUBITAL Performed at [facility] [address] [place], [place] [address]    Special Requests   Final    BOTTLES DRAWN AEROBIC AND ANAEROBIC Blood Culture adequate volume Performed at [facility] [address], [place], [place] [address]    Culture   Final    NO GROWTH 5 DAYS Performed at [facility], [place] [address] [place], [place] [address]    Report Status 06/28/2020 FINAL  Final  Respiratory Panel by RT PCR (Flu A&B, Covid) - Nasopharyngeal Swab     Status: None   Collection Time: 06/23/20  9:54 PM   Specimen: Nasopharyngeal Swab  Result Value Ref Range Status   SARS Coronavirus 2 by RT PCR NEGATIVE NEGATIVE Final    Comment: (NOTE) SARS-CoV-2 target nucleic acids are NOT DETECTED.  The SARS-CoV-2 RNA is generally detectable in upper respiratoy specimens during the acute phase of infection. The lowest concentration of SARS-CoV-2 viral copies this assay can detect is 131 copies/mL. A negative result does not preclude SARS-Cov-2 infection and should not be used as the sole basis for treatment or other patient management decisions. A negative result may occur with  improper specimen collection/handling, submission of specimen other than nasopharyngeal swab, presence of viral  mutation(s) within the areas targeted by this assay, and inadequate number of viral copies (<131 copies/mL). A negative result must be combined with clinical observations, patient history, and epidemiological information. The expected result is Negative.  Fact Sheet for Patients:  PinkCheek.be  Fact Sheet for Healthcare Providers:  GravelBags.it  This test is no t yet approved or cleared by the [place] FDA and  has been authorized for detection and/or diagnosis of SARS-CoV-2 by FDA under an Emergency Use Authorization (EUA). This EUA will remain  in effect (meaning this test can be used) for the duration of the COVID-19 declaration under Section 564(b)(1) of the Act, 21 U.S.C. section 360bbb-3(b)(1), unless the authorization is terminated or revoked sooner.     Influenza A by PCR NEGATIVE NEGATIVE Final   Influenza B by PCR NEGATIVE NEGATIVE Final    Comment: (NOTE) The Xpert Xpress SARS-CoV-2/FLU/RSV assay is intended as an aid in  the diagnosis of influenza from Nasopharyngeal swab specimens and  should not be used as a sole basis for treatment. Nasal washings and  aspirates are unacceptable for Xpert Xpress SARS-CoV-2/FLU/RSV  testing.  Fact Sheet for Patients: PinkCheek.be  Fact Sheet for Healthcare Providers: GravelBags.it  This test is not yet approved or cleared by the [place] FDA and  has been authorized for detection and/or diagnosis of SARS-CoV-2 by  FDA under an Emergency Use Authorization (EUA). This EUA will remain  in effect (meaning this test can be used) for the duration of the  Covid-19 declaration under Section 564(b)(1) of the Act, 21  U.S.C. section 360bbb-3(b)(1), unless the authorization is  terminated or revoked. Performed at [facility], [place] [address] [place], [place] [address]   Blood culture (routine  x 2)     Status: Abnormal   Collection Time: 06/23/20 10:36 PM   Specimen: BLOOD  Result Value Ref Range Status   Specimen Description   Final    BLOOD LEFT ANTECUBITAL Performed at [facility] [address], [place], [place] [address]    Special Requests   Final    BOTTLES DRAWN AEROBIC AND  ANAEROBIC Blood Culture adequate volume Performed at [facility] [address], [place], [place] [address]    Culture  Setup Time   Final    GRAM NEGATIVE RODS ANAEROBIC BOTTLE ONLY Organism ID to follow CRITICAL RESULT CALLED TO, READ BACK BY AND VERIFIED WITH: Melodye Ped PharmD 10:55 06/25/20 ([email or web address])    Culture (A)  Final    BACTEROIDES FRAGILIS BETA LACTAMASE POSITIVE Performed at [facility], [address] [place], [place] [address]    Report Status 06/27/2020 FINAL  Final  Blood Culture ID Panel (Reflexed)     Status: Abnormal   Collection Time: 06/23/20 10:36 PM  Result Value Ref Range Status   Enterococcus faecalis NOT DETECTED NOT DETECTED Final   Enterococcus Faecium NOT DETECTED NOT DETECTED Final   Listeria monocytogenes NOT DETECTED NOT DETECTED Final   Staphylococcus species NOT DETECTED NOT DETECTED Final   Staphylococcus aureus (BCID) NOT DETECTED NOT DETECTED Final   Staphylococcus epidermidis NOT DETECTED NOT DETECTED Final   Staphylococcus lugdunensis NOT DETECTED NOT DETECTED Final   Streptococcus species NOT DETECTED NOT DETECTED Final   Streptococcus agalactiae NOT DETECTED NOT DETECTED Final   Streptococcus pneumoniae NOT DETECTED NOT DETECTED Final   Streptococcus pyogenes NOT DETECTED NOT DETECTED Final   A.calcoaceticus-baumannii NOT DETECTED NOT DETECTED Final   Bacteroides fragilis DETECTED (A) NOT DETECTED Final    Comment: CRITICAL RESULT CALLED TO, READ BACK BY AND VERIFIED WITH: Melodye Ped PharmD 10:55 06/25/20 ([email or web address])    Enterobacterales NOT DETECTED NOT DETECTED Final   Enterobacter cloacae complex  NOT DETECTED NOT DETECTED Final   Escherichia coli NOT DETECTED NOT DETECTED Final   Klebsiella aerogenes NOT DETECTED NOT DETECTED Final   Klebsiella oxytoca NOT DETECTED NOT DETECTED Final   Klebsiella pneumoniae NOT DETECTED NOT DETECTED Final   Proteus species NOT DETECTED NOT DETECTED Final   Salmonella species NOT DETECTED NOT DETECTED Final   Serratia marcescens NOT DETECTED NOT DETECTED Final   Haemophilus influenzae NOT DETECTED NOT DETECTED Final   Neisseria meningitidis NOT DETECTED NOT DETECTED Final   Pseudomonas aeruginosa NOT DETECTED NOT DETECTED Final   Stenotrophomonas maltophilia NOT DETECTED NOT DETECTED Final   Candida albicans NOT DETECTED NOT DETECTED Final   Candida auris NOT DETECTED NOT DETECTED Final   Candida glabrata NOT DETECTED NOT DETECTED Final   Candida krusei NOT DETECTED NOT DETECTED Final   Candida parapsilosis NOT DETECTED NOT DETECTED Final   Candida tropicalis NOT DETECTED NOT DETECTED Final   Cryptococcus neoformans/gattii NOT DETECTED NOT DETECTED Final    Comment: Performed at [facility], [place] [address], [place], [place] [address]  Urine culture     Status: None   Collection Time: 06/24/20  1:09 AM   Specimen: In/Out Cath Urine  Result Value Ref Range Status   Specimen Description   Final    IN/OUT CATH URINE Performed at [facility] [address] [place], [place] [address]    Special Requests   Final    NONE Performed at [facility], [place] [address], [place], [place] [address]    Culture   Final    NO GROWTH Performed at [facility], [place] [address], [place], [place] [address]    Report Status 06/25/2020 FINAL  Final  C Difficile Quick Screen w PCR reflex     Status: None   Collection Time: 06/25/20  7:00 PM   Specimen: STOOL  Result Value Ref Range  Status   C Diff antigen NEGATIVE NEGATIVE Final   C Diff toxin NEGATIVE NEGATIVE Final   C Diff interpretation No C. difficile  detected.  Final    Comment: Performed at [facility], [place] [address] [place], [place] [address]         Radiology Studies: CT ABDOMEN PELVIS W CONTRAST  Result Date: 06/27/2020 CLINICAL DATA:  Nausea, emesis, suspected ileus, re-evaluate pelvic fluid collection status post TAH-BSO, evaluate for ureteral injury EXAM: CT ABDOMEN AND PELVIS WITH CONTRAST TECHNIQUE: Multidetector CT imaging of the abdomen and pelvis was performed using the standard protocol following bolus administration of intravenous contrast. CONTRAST:  113mL OMNIPAQUE IOHEXOL 300 MG/ML SOLN, additional oral enteric contrast COMPARISON:  06/24/2020 FINDINGS: Lower chest: Small right pleural effusion. Hepatobiliary: No solid liver abnormality is seen. No gallstones, gallbladder wall thickening, or biliary dilatation. Pancreas: Unremarkable. No pancreatic ductal dilatation or surrounding inflammatory changes. Spleen: Normal in size without significant abnormality. Adrenals/Urinary Tract: Adrenal glands are unremarkable. There is mild right hydronephrosis, similar to prior examination. The distal ureters are poorly opacified, however there is minimal excretion of contrast into the urinary bladder and no contrast identified external to the urinary tract. Mildly distended urinary bladder. Stomach/Bowel: Stomach is within normal limits. Appendix appears normal. No evidence of bowel wall thickening, distention, or inflammatory changes. Vascular/Lymphatic: Aortic atherosclerosis. No enlarged abdominal or pelvic lymph nodes. Reproductive: Status post hysterectomy. No significant interval change in an air and fluid collection near the vaginal cuff, posterior to the bladder and closely abutting the adjacent mid sigmoid colon, measuring approximately 5.0 x 2.8 cm (series 7, image 56). Other: Anasarca.  No abdominopelvic ascites. Musculoskeletal: No acute or significant osseous findings. IMPRESSION: 1. No significant interval  change in an air and fluid collection near the vaginal cuff, posterior to the bladder and closely abutting the adjacent mid sigmoid colon, measuring approximately 5.0 x 2.8 cm. Findings are concerning for postoperative abscess. 2. There is mild right hydronephrosis, similar to prior examination. The distal ureters are poorly opacified, however there is minimal excretion of contrast into the urinary bladder and no contrast identified external to the urinary tract. 3. Anasarca. 4. Small right pleural effusion. 5.  Aortic Atherosclerosis (ICD10-I70.0). Electronically Signed   By: Eddie Candle M.D.   On: 06/27/2020 19:44   [place] EKG SITE RITE  Result Date: 06/27/2020 If Site Rite image not attached, placement could not be confirmed due to current cardiac rhythm.       Scheduled Meds: . amitriptyline  25 mg Oral QHS  . amoxicillin-clavulanate  1 tablet Oral Q12H  . furosemide  40 mg Intravenous Once  . heparin  5,000 Units Subcutaneous Q8H  . losartan  100 mg Oral Daily   And  . hydrochlorothiazide  25 mg Oral Daily  . insulin aspart  0-20 Units Subcutaneous TID WC  . insulin glargine  8 Units Subcutaneous Daily  . levothyroxine  50 mcg Oral Q0600  . pantoprazole  40 mg Oral Daily  . pregabalin  150 mg Oral TID  . sodium bicarbonate  650 mg Oral BID   Continuous Infusions:    LOS: 4 days    Time spent: 35 minutes    Irine Seal, MD [facility]   To contact the attending provider between 7A-7P or the covering provider during after hours 7P-7A, please log into the web site [email or web address] and access using universal [place] password for that web site. If you do not have the password, please call  the hospital operator.  06/28/2020, 1:21 PM

## 2020-06-28 NOTE — Progress Notes (Signed)
Discharge instructions given to pt and all questions were answered.  

## 2020-06-28 NOTE — Discharge Summary (Addendum)
Physician Discharge Summary  Patient ID: Christy Carpenter MRN: [id number] DOB/AGE: 62-Apr-1957 62 y.o.  Admit date: 06/23/2020 Discharge date: 07/03/2020  Admission Diagnoses: Severe Sepsis from bacteremia present on admission  Discharge Diagnoses:  Principal Problem:   SIRS (systemic inflammatory response syndrome) (HCC) Active Problems:   Uncontrolled type 2 diabetes mellitus with hyperglycemia (HCC)   Endometrial cancer (HCC)   Obesity (BMI 30.0-34.9)   SIRS due to infectious process with acute organ dysfunction ([place])   Hypothyroidism   Gastroesophageal reflux disease   Neuropathy   Pelvic fluid collection   Type 2 diabetes mellitus with hyperlipidemia ([place])   Bacteremia   Discharged Condition:  The patient is in good condition and stable for discharge.    Hospital Course: Christy Carpenter is a 64 year old female who presented to the Emergency Room on 06/23/2020 with complaints of fever, emesis, abdominal pain. In the ER, she was noted to have tachycardia, dyspnea with conversation, abdominal tenderness. A CT scan revealed no central or lobar pulmonary artery emboli. 2. Postsurgical changes from recent total abdominal hysterectomy and bilateral salpingo-oophorectomy with a lobular collection of air and fluid about the surgical site/vaginal cuff some of which is demonstrating some early peripheral rim enhancement which may reflect a developing abscess with associated thickening of the adjacent colon and bladder. Injury of bowel bladder is difficult to fully exclude the absence of luminal contrast media. 3. Asymmetric right urinary tract dilatation with mild urothelial thickening and periureteral hazy stranding which courses into the vicinity of postsurgical changes present in the deep pelvis and collection above. No visible obstructi ve urolithiasis is seen. Findings could reflect inflammatory stricture secondary to the above process. Furthermore could correlate with urinalysis to exclude concomitant  urinary tract infection. 4. New 3 mm nodule in the periphery of the left upper lobe. Consider three to six-month follow-up imaging given patient's history of known malignancy. 5. 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 6. Hepatic steatosis. Her vital signs improved with resuscitation and IV antibiotics. Blood cultures obtained in the ER were positive for bacteroides fragilis. Her admitting diagnosis was severe sepsis from bacteremia present on admission. She was started on IV zosyn for 4 days and then switched to Augmentin. Her WBC count trended downward. She received three doses of lasix during her hospitalization with significant diuresis and improvement in her symptoms of dyspnea. A repeat CT scan was ordered on 06/27/20 to further evaluate her pelvic pain and follow up for any changes from the previous CT since she was having episodes of emesis as well. The CT AP showed no significant changes from the previous scan. Her nausea and emesis resolved and she was tolerated a solid diet and passing flatus. She was discharged home on Augmentin, oral pain medications.     Consults: Hospitalist  Significant Diagnostic Studies: Labs, CT scans  Treatments: IV antibiotics, IV resuscitation, Diuretics  Discharge Exam: Blood pressure 133/67, pulse 95, temperature 98.5 F (36.9 C), temperature source Oral, resp. rate 17, height 5' (1.524 m), weight 202 lb 8 oz (91.9 kg), SpO2 97 %. General appearance: alert, cooperative and no distress Resp: clear to auscultation bilaterally Cardio: regular rate and rhythm, S1, S2 normal, no murmur, click, rub or gallop GI: active bowel sounds, abdomen round Extremities: BLE edema improving Incision/Wound: Abdomen lap incisions to the abdomen with dermabond without erythema or drainage. Mild  ecchymosis noted  around left lateral incision with no active drainage.  Disposition: Discharge disposition: 01-Home or Self Care      Discharge Instructions     Call MD for:  difficulty breathing, headache or visual disturbances   Complete by: As directed    Call MD for:  extreme fatigue   Complete by: As directed    Call MD for:  hives   Complete by: As directed    Call MD for:  persistant dizziness or light-headedness   Complete by: As directed    Call MD for:  persistant nausea and vomiting   Complete by: As directed    Call MD for:  redness, tenderness, or signs of infection (pain, swelling, redness, odor or green/yellow discharge around incision site)   Complete by: As directed    Call MD for:  severe uncontrolled pain   Complete by: As directed    Call MD for:  temperature >100.4   Complete by: As directed    Diet - low sodium heart healthy   Complete by: As directed    Driving Restrictions   Complete by: As directed    No driving for 1 week from surgery.  Do not take narcotics and drive.   Increase activity slowly   Complete by: As directed    Lifting restrictions   Complete by: As directed    No lifting greater than 10 lbs for 6 weeks from surgery.   Sexual Activity Restrictions   Complete by: As directed    No sexual activity, nothing in the vagina, for 8 weeks.      Allergies as of 06/28/2020       Reactions   Byetta 10 Mcg Pen [exenatide] Itching   Januvia [sitagliptin]    Yeast infections    Invokana [canagliflozin] Other (See Comments)   Yeast infections        Medication List     STOP taking these medications    oxyCODONE 5 MG immediate release tablet Commonly known as: Oxy IR/ROXICODONE       TAKE these medications    amitriptyline 25 MG tablet Commonly known as: ELAVIL Take 25 mg by mouth at bedtime.   amoxicillin-clavulanate 875-125 MG tablet Commonly known as: AUGMENTIN Take 1 tablet by mouth every 12 (twelve) hours for 10  days.   atorvastatin 40 MG tablet Commonly known as: LIPITOR Take 1 tablet (40 mg total) by mouth daily at 6 PM.   Farxiga 10 MG Tabs tablet Generic drug: dapagliflozin propanediol Take 10 mg by mouth daily.   fenofibrate micronized 134 MG capsule Commonly known as: LOFIBRA Take 134 mg by mouth daily.   FreeStyle Libre 2 Sensor Misc   HYDROcodone-acetaminophen 5-325 MG tablet Commonly known as: NORCO/VICODIN Take 1 tablet by mouth every 6 (six) hours as needed for severe pain. Do not take and drive   Janumet XR 82-4235 MG Tb24 Generic drug: SitaGLIPtin-MetFORMIN HCl Take 1 tablet by mouth 2 (two) times daily.   levothyroxine 50 MCG tablet Commonly known as: SYNTHROID Take 50 mcg by mouth daily before breakfast.   losartan-hydrochlorothiazide 100-25 MG tablet Commonly known as: HYZAAR Take 1 tablet by mouth daily.   NovoLIN N FlexPen 100 UNIT/ML Kiwkpen Generic drug: Insulin NPH (Human) (Isophane) Inject 75 Units into the skin 3 (three) times daily.   NovoLIN R FlexPen 100 UNIT/ML Sopn Generic drug: Insulin Regular Human Inject 75 Units into the skin 3 (three) times daily.   OneTouch Ultra test strip Generic drug: glucose blood 1 each  3 (three) times daily.   pregabalin 150 MG capsule Commonly known as: LYRICA Take 150 mg by mouth 3 (three) times daily.   senna-docusate 8.6-50 MG tablet Commonly known as: Senokot-S Take 2 tablets by mouth at bedtime. For AFTER surgery, do not take if having diarrhea        Follow-up Information     [name], MD. Schedule an appointment as soon as possible for a visit in 2 week(s).   Specialty: Family Medicine Contact information: [place] [address] [phone]         [name], MD Follow up on 07/08/2020.   Specialty: Gynecologic Oncology Why: at 1:45pm at the [facility]. Contact information: [place] [address] [phone]                  Greater than thirty minutes were spend for face to face discharge instructions and discharge orders/summary in EPIC.   Signed: Dorothyann Gibbs 07/03/2020, 1:35 PM

## 2020-06-28 NOTE — Progress Notes (Signed)
GYN Oncology Progress Note   Subjective: Patient reports feeling better this am. No nausea or emesis reported since yesterday am. Passing flatus. No BM since episodes of diarrhea three days ago. States she has not taken in a large amt of solid food due to decreased appetite for the hospital food since she had an episode of emesis. Her sister is going to bring her something for breakfast. She states her shortness of breath has improved. She has been voiding large amounts without difficulty. She feels her lower extremity edema is improving. No chest pain reported. Continues to have minimal amount of pink vaginal discharge with an odor. No fevers or chills reported but states she was sweaty during the night. The lower abdominal cramping is improving which she correlates with oxycodone use. CT findings discussed. No concerns voiced.  Objective: Vital signs in last 24 hours: Temp:  [98.6 F (37 C)-98.9 F (37.2 C)] 98.9 F (37.2 C) (11/12 0535) Pulse Rate:  [90-93] 93 (11/12 0535) Resp:  [20] 20 (11/12 0535) BP: (121-126)/(66-75) 121/67 (11/12 0535) SpO2:  [96 %-97 %] 96 % (11/12 0535) Weight:  [202 lb 8 oz (91.9 kg)] 202 lb 8 oz (91.9 kg) (11/12 0925) Last BM Date: 06/25/20  Intake/Output from previous day: 11/11 0701 - 11/12 0700 In: 5 [P.O.:940] Out: 4301 [Urine:4300; Emesis/NG output:1]  Physical Examination: General: alert, cooperative and no distress Resp: clear to auscultation bilaterally Cardio: regular rate and rhythm, S1, S2 normal, no murmur, click, rub or gallop GI: incision: lap sites to the abdomen with dermabond without erythema or drainage and abdomen rounded, firm (similar to pre-op status), hypoactive bowel sounds, non-tympanic Extremities: BLE edema improving  Labs: WBC/Hgb/Hct/Plts:  11.9/9.5/28.7/239 (11/12 0459) BUN/Cr/glu/ALT/AST/amyl/lip:  13/0.99/--/--/--/--/-- (11/12 2505)  Assessment: 64 y.o. with grade 1 endometrial adenocarcinoma in the setting of obesity,  type 2 poorly controlled diabetes mellitus, s/p robotic hysterectomy, BSO sentinel lymph node biopsy and right ureterolysis on 06/20/20. Now admitted with SIRS, a pelvic fluid collection, and GI symptoms concerning for ileus.   Pain:  Pain is well-controlled on PRN medications.  Heme: Hgb 9.5 and Hct 28.7 this am- overall stable.   ID: Currently on Augmentin for presumed pelvic abscess/SIRS. WBC 11.9 this am. Urine culture from ED without growth. Blood culture ID panel from 06/23/20 positive for bacteroides fragilis.  CV: Tachycardia improved. BP increasing-Hx HTN. Hyzaar reordered per Dr. Denman George.  GI:  Tolerating po: Yes, emesis after solid food 06/26/20. Antiemetics ordered as needed. C diff negative-precautions discontinued. Repeat CT from 06/27/20 showed no significant interval change in the pelvic fluid collection, mild right hydronephrosis unchanged. No evidence of ureteral injury. CT CAP from 06/24/20:  IMPRESSION: 1. No central or lobar pulmonary artery emboli. More distal evaluation limited by respiratory motion artifact. 2. Postsurgical changes from recent total abdominal hysterectomy and bilateral salpingo-oophorectomy with a lobular collection of air and fluid about the surgical site/vaginal cuff some of which is demonstrating some early peripheral rim enhancement which may reflect a developing abscess with associated thickening of the adjacent colon and bladder. Injury of bowel bladder is difficult to fully exclude the absence of luminal contrast media. 3. Asymmetric right urinary tract dilatation with mild urothelial thickening and periureteral hazy stranding which courses into the vicinity of postsurgical changes present in the deep pelvis and collection above. No visible obstructive urolithiasis is seen. Findings could reflect inflammatory stricture secondary to the above process. Furthermore could correlate with urinalysis to exclude concomitant urinary tract infection. 4. New 3 mm nodule  in  the periphery of the left upper lobe. Consider three to six-month follow-up imaging given patient's history of known malignancy. 5. 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 6. Hepatic steatosis.  GU: Creatinine 0.99 this am-baseline. Adequate urine output reported. S/P lasix administration on 06/26/20 for ? volume overload.  FEN: K+ 3.5 this am from 3.8 yesterday-replacement ordered per Hospitalist Team on 11/10. Mag 1.9 on 11/10.  Endo: Diabetes mellitus Type II, under fair control.  CBG:  CBG (last 3)  Recent Labs    06/27/20 1657 06/27/20 2147 06/28/20 0748  GLUCAP 283* 218* 196*    Prophylaxis: Heparin ordered Q8H.  Plan: Weigh pt this am Dr. Berline Lopes to see patient later today Plan for discharge later today on Augmentin Resuming diabetic medications-per Hospitalist Team  Continue plan of care per Dr. Denman George and Hospitalist Team   LOS: 4 days    Christy Carpenter 06/28/2020, 11:28 AM

## 2020-07-01 ENCOUNTER — Encounter (HOSPITAL_COMMUNITY): Payer: Self-pay | Admitting: Gynecologic Oncology

## 2020-07-01 ENCOUNTER — Telehealth: Payer: Self-pay

## 2020-07-01 NOTE — Telephone Encounter (Addendum)
Ms Mcgahan states that she is tired.  She is up and ambulating.She is eating and drinking well.  She is not getting in 64 of fluid a day and is eating very lightly as she is afraid to vomit as she did after surgery. Told her to drink at least (8) 8 oz glasses of water a day and increase diet to soft and more solid food. She is passing gas.  She has had 1 small soft stool.  She is taking the senokot-S 2 tabs at hs.  Told He that Melissa Cross,NP  Said to add a capful of Miralax bid  To help move bowels. Afebrile. No shaking chills. Incisions are D&I. Her pain in her abdomen is 4/10.  She is using Ibuprofen and tylenol intermittently.  She has not used the hydrocodone 5-325 mg as she is concerned about slowing bowels down.  Her last dose of any type of pain medication was  ibuprofen last night.  Instructed her to use the Ibuprofen q 8 hours  alt with tylenol every 6 hours. Taking non narcotic pain medicine regularly  may help to decrease pain. Pt aware of office number [phone] to call if she has any questions or concerns.

## 2020-07-08 ENCOUNTER — Inpatient Hospital Stay: Payer: BC Managed Care – PPO | Attending: Gynecologic Oncology | Admitting: Gynecologic Oncology

## 2020-07-08 ENCOUNTER — Encounter: Payer: Self-pay | Admitting: Gynecologic Oncology

## 2020-07-08 ENCOUNTER — Inpatient Hospital Stay: Payer: BC Managed Care – PPO

## 2020-07-08 ENCOUNTER — Other Ambulatory Visit: Payer: Self-pay

## 2020-07-08 VITALS — BP 119/75 | HR 100 | Temp 97.3°F | Resp 18 | Wt 189.4 lb

## 2020-07-08 DIAGNOSIS — C541 Malignant neoplasm of endometrium: Secondary | ICD-10-CM

## 2020-07-08 DIAGNOSIS — A419 Sepsis, unspecified organism: Secondary | ICD-10-CM | POA: Diagnosis not present

## 2020-07-08 DIAGNOSIS — N179 Acute kidney failure, unspecified: Secondary | ICD-10-CM

## 2020-07-08 DIAGNOSIS — R6520 Severe sepsis without septic shock: Secondary | ICD-10-CM | POA: Diagnosis not present

## 2020-07-08 DIAGNOSIS — Z794 Long term (current) use of insulin: Secondary | ICD-10-CM | POA: Insufficient documentation

## 2020-07-08 DIAGNOSIS — E119 Type 2 diabetes mellitus without complications: Secondary | ICD-10-CM | POA: Insufficient documentation

## 2020-07-08 DIAGNOSIS — Z79899 Other long term (current) drug therapy: Secondary | ICD-10-CM | POA: Diagnosis not present

## 2020-07-08 DIAGNOSIS — E7800 Pure hypercholesterolemia, unspecified: Secondary | ICD-10-CM | POA: Insufficient documentation

## 2020-07-08 DIAGNOSIS — Z90722 Acquired absence of ovaries, bilateral: Secondary | ICD-10-CM | POA: Insufficient documentation

## 2020-07-08 DIAGNOSIS — I10 Essential (primary) hypertension: Secondary | ICD-10-CM | POA: Insufficient documentation

## 2020-07-08 DIAGNOSIS — Z90710 Acquired absence of both cervix and uterus: Secondary | ICD-10-CM | POA: Diagnosis not present

## 2020-07-08 DIAGNOSIS — Z7189 Other specified counseling: Secondary | ICD-10-CM

## 2020-07-08 LAB — BASIC METABOLIC PANEL
Anion gap: 13 (ref 5–15)
BUN: 23 mg/dL (ref 8–23)
CO2: 21 mmol/L — ABNORMAL LOW (ref 22–32)
Calcium: 9.7 mg/dL (ref 8.9–10.3)
Chloride: 102 mmol/L (ref 98–111)
Creatinine, Ser: 1.23 mg/dL — ABNORMAL HIGH (ref 0.44–1.00)
GFR, Estimated: 50 mL/min — ABNORMAL LOW (ref >60)
Glucose, Bld: 138 mg/dL — ABNORMAL HIGH (ref 70–99)
Potassium: 4.6 mmol/L (ref 3.5–5.1)
Sodium: 136 mmol/L (ref 135–145)

## 2020-07-08 LAB — CBC WITH DIFFERENTIAL (CANCER CENTER ONLY)
Abs Immature Granulocytes: 0.01 10*3/uL (ref 0.00–0.07)
Basophils Absolute: 0.1 10*3/uL (ref 0.0–0.1)
Basophils Relative: 1 %
Eosinophils Absolute: 0.1 10*3/uL (ref 0.0–0.5)
Eosinophils Relative: 2 %
HCT: 31.5 % — ABNORMAL LOW (ref 36.0–46.0)
Hemoglobin: 10.6 g/dL — ABNORMAL LOW (ref 12.0–15.0)
Immature Granulocytes: 0 %
Lymphocytes Relative: 41 %
Lymphs Abs: 3.3 10*3/uL (ref 0.7–4.0)
MCH: 28.9 pg (ref 26.0–34.0)
MCHC: 33.7 g/dL (ref 30.0–36.0)
MCV: 85.8 fL (ref 80.0–100.0)
Monocytes Absolute: 0.6 10*3/uL (ref 0.1–1.0)
Monocytes Relative: 7 %
Neutro Abs: 3.9 10*3/uL (ref 1.7–7.7)
Neutrophils Relative %: 49 %
Platelet Count: 363 10*3/uL (ref 150–400)
RBC: 3.67 MIL/uL — ABNORMAL LOW (ref 3.87–5.11)
RDW: 13.2 % (ref 11.5–15.5)
WBC Count: 7.9 10*3/uL (ref 4.0–10.5)
nRBC: 0 % (ref 0.0–0.2)

## 2020-07-08 MED ORDER — AMOXICILLIN-POT CLAVULANATE 875-125 MG PO TABS: 1.0000 | ORAL_TABLET | Freq: Two times a day (BID) | ORAL | 0 refills | Status: DC

## 2020-07-08 MED ORDER — TRAMADOL HCL 50 MG PO TABS: 50.0000 mg | ORAL_TABLET | Freq: Four times a day (QID) | ORAL | 0 refills | Status: DC | PRN

## 2020-07-08 NOTE — Patient Instructions (Signed)
Dr Denman George wants to see you back in 1 month to check on how the top of the vagina is healing. She is checking blood work today. She is prescribing your antibiotic for another 2 weeks. She is prescribing a pain medication for severe pains.  Please avoid intercourse for another 2 months.  Please work to keep your sugars well controlled as this can contribute to the infection getting worse.

## 2020-07-08 NOTE — Progress Notes (Signed)
Follow-up Note: Gyn-Onc  Consult was requested by Dr. Benjie Karvonen for the evaluation of Christy Carpenter 64 y.o. female  CC:  Chief Complaint  Patient presents with  . Endometrial cancer [facility])    Assessment/Plan:  Christy Carpenter  is a 64 y.o.  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  1/ endometrial cancer:  Pathology revealed low risk factors for recurrence, therefore no adjuvant therapy is recommended according to NCCN guidelines.  I discussed risk for recurrence and typical symptoms encouraged her to notify us of these should they develop between visits.  I recommend she have follow-up every 6 months for 5 years in accordance with NCCN guidelines. Those visits should include symptom assessment, physical exam and pelvic examination. Pap smears are not indicated or recommended in the routine surveillance of endometrial cancer.  2/ pelvic fluid collection: She continues have symptoms of drainage from this pelvic abscess and her vaginal cuff appears somewhat necrotic on examination. I am recommending persistent use of Augmentin antibiotics and we will reevaluate the cuff at 4 weeks. Based on my evaluation today, there is no tissue to debride or reapproximate on examination. She does not appear septic. Her heart rate is back to baseline what it was when I first met this patient in July. I have prescribed her Augmentin for an additional 2 weeks and tramadol for her pain symptoms.  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  The patient reported a 1 year history of postmenopausal bleeding. She attributed this bleeding to having a retained IUD. She did not have an OBGYN provider and so was not being evaluated. She reported only seeing her endocrinologist for surveillance of her diabetes on a regular basis. She has a PCP, however  does not see that provider at regularly scheduled visits. She brought attention to the symptom of postmenopausal spotting to her endocrinologist on 01/19/20 who recommended OBGYN work up.  The patient saw Dr Benjie Karvonen on January 29, 2020 and a Pap smear was performed which was cytologically normal and negative for high-risk HPV.  Additionally she underwent ultrasound evaluation of the endometrium at that date which revealed a uterus of normal size, the endometrium is heterogeneous and thickened with vascularity and measured 27.3 mm.  A Lippe's Loop IUD was noted.  It was within the endometrial cavity.  There were normal ovaries and no adnexal masses.  She declined endometrial biopsy on that same day and returned at a separate visit on February 14, 2020 for endometrial sampling with a Pipelle biopsy.  This revealed FIGO grade 1 endometrioid adenocarcinoma.  The patient reported a past history of type 2 diabetes mellitus for which she takes insulin.  She is managed by an endocrinologist for this whom she sees regularly.  Her last documented HbA1c was 13% in January 2021.  At that time she was regularly having fasting blood glucoses in the 400-500 range.  Alterations were made in her insulin regimen and the patient began observing fasting blood glucoses in the 200s.  She rarely has blood glucoses less than 200 even in the fasting timeframe. Her surgery was initially delayed due to very poor diabetes control (HbAc1 of 10.7%) and her cancer was treated medically with Megace. She was prescribed megace which facilitated increased food consumption due to appetite stimulation.  Her HbA1c was rechecked in  September and had increased to 11.1%. However, her endocrinologist prescribed a freestyle libre continuous blood glucose monitoring system and with this she has stopped having spikes in blood glucose that were too high to read (>600). According to the patient, these were happening regularly before September, 2021.   Interval  Hx:  On June 20, 2020 she underwent robotic assisted total hysterectomy, BSO, sentinel lymph node biopsy, right ureterolysis. Intraoperative findings were significant for a 10 cm uterus with no gross extrauterine disease. Extreme visceral adiposity making visualization of pelvic strip structures limited. There was evidence of stage IV endometriosis with a right ovarian chocolate cyst densely adherent to the right uterosacral ligament and the rectovaginal septum with adherence to the posterior cervix. 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 Surgery was challenging due to morbid obesity and dense adhesions from prior infection or endometriosis.  Final pathology revealed a FIGO grade 1 endometrioid endometrial adenocarcinoma. The tumor measured 1.5 cm. There was one of 28 mm (less than one half) myometrial invasion. There was no LVSI. The cervix and adnexa were negative for metastatic disease. The sentinel lymph nodes were negative for metastatic disease. MMR testing was normal, MSI testing was stable. She was characterizes FIGO stage Ia grade 1 endometrioid endometrial adenocarcinoma with low risk features for recurrence. In accordance with NCCN guidelines no adjuvant therapy was recommended.Marland Kitchen  She was admitted on POD 3 for bacteremia and sepsis. A small (3cm) fluid collection was identified at the vaginal cuff which was not rim enhancing, not amenable to drainage and did not increase in size over a 5 day period. No other clear source of infection was noted. She was treated with empiric antibiotics and discharged on oral antibiotics when she had remained afebrile with a normalizing WBC.  After discharge she reported feeling persistently somewhat unwell though not as sick as she had been prior to admission. She reported the development of drainage of foul-smelling fluid from the vagina. She reported right SIJ pain worse with sitting and  walking. This radiated around to the pelvis. She denied lower extremity neurologic symptoms.  Current Meds:  Outpatient Encounter Medications as of 07/08/2020  Medication Sig  . amitriptyline (ELAVIL) 25 MG tablet Take 25 mg by mouth at bedtime.  Marland Kitchen amoxicillin-clavulanate (AUGMENTIN) 875-125 MG tablet Take 1 tablet by mouth every 12 (twelve) hours for 10 days.  Marland Kitchen atorvastatin (LIPITOR) 40 MG tablet Take 1 tablet (40 mg total) by mouth daily at 6 PM.  . Continuous Blood Gluc Sensor (FREESTYLE LIBRE 2 SENSOR) MISC   . dapagliflozin propanediol (FARXIGA) 10 MG TABS tablet Take 10 mg by mouth daily.   . fenofibrate micronized (LOFIBRA) 134 MG capsule Take 134 mg by mouth daily.  Marland Kitchen ibuprofen (ADVIL) 200 MG tablet Take 200-400 mg by mouth every 6 (six) hours as needed.  Marland Kitchen levothyroxine (SYNTHROID) 50 MCG tablet Take 50 mcg by mouth daily before breakfast.  . losartan-hydrochlorothiazide (HYZAAR) 100-25 MG tablet Take 1 tablet by mouth daily.  Marland Kitchen NOVOLIN N FLEXPEN 100 UNIT/ML Kiwkpen Inject 75 Units into the skin 3 (three) times daily.   Marland Kitchen NOVOLIN R FLEXPEN 100 UNIT/ML SOPN Inject 75 Units into the skin 3 (three) times daily.   . SitaGLIPtin-MetFORMIN HCl (JANUMET XR) 50-1000 MG TB24 Take 1 tablet by mouth 2 (two) times daily.  Marland Kitchen amoxicillin-clavulanate (AUGMENTIN) 875-125 MG tablet Take 1 tablet by mouth 2 (two) times daily.  . pregabalin (LYRICA) 150 MG capsule Take 150 mg by mouth  3 (three) times daily.  (Patient not taking: Reported on 07/08/2020)  . senna-docusate (SENOKOT-S) 8.6-50 MG tablet Take 2 tablets by mouth at bedtime. For AFTER surgery, do not take if having diarrhea (Patient not taking: Reported on 07/08/2020)  . traMADol (ULTRAM) 50 MG tablet Take 1 tablet (50 mg total) by mouth every 6 (six) hours as needed for severe pain.  . [DISCONTINUED] HYDROcodone-acetaminophen (NORCO/VICODIN) 5-325 MG tablet Take 1 tablet by mouth every 6 (six) hours as needed for severe pain. Do not take and  drive  . [DISCONTINUED] ONETOUCH ULTRA test strip 1 each 3 (three) times daily.   No facility-administered encounter medications on file as of 07/08/2020.    Allergy:  Allergies  Allergen Reactions  . Byetta 10 Mcg Pen [Exenatide] Itching  . Januvia [Sitagliptin]     Yeast infections   . Invokana [Canagliflozin] Other (See Comments)    Yeast infections    Social Hx:   Social History   Socioeconomic History  . Marital status: Married    Spouse name: Not on file  . Number of children: Not on file  . Years of education: Not on file  . Highest education level: Not on file  Occupational History  . Not on file  Tobacco Use  . Smoking status: Never Smoker  . Smokeless tobacco: Never Used  Vaping Use  . Vaping Use: Never used  Substance and Sexual Activity  . Alcohol use: No  . Drug use: No  . Sexual activity: Not on file  Other Topics Concern  . Not on file  Social History Narrative   Lives home with spouse.  Education Masters Degree.  Works at [employer].  2 Children.   Caffeine 1 cup coffee daily.    Social Determinants of Health   Financial Resource Strain:   . Difficulty of Paying Living Expenses: Not on file  Food Insecurity:   . Worried About Charity fundraiser in the Last Year: Not on file  . Ran Out of Food in the Last Year: Not on file  Transportation Needs:   . Lack of Transportation (Medical): Not on file  . Lack of Transportation (Non-Medical): Not on file  Physical Activity:   . Days of Exercise per Week: Not on file  . Minutes of Exercise per Session: Not on file  Stress:   . Feeling of Stress : Not on file  Social Connections:   . Frequency of Communication with Friends and Family: Not on file  . Frequency of Social Gatherings with Friends and Family: Not on file  . Attends Religious Services: Not on file  . Active Member of Clubs or Organizations: Not on file  . Attends Archivist Meetings: Not on file  . Marital Status: Not on file   Intimate Partner Violence:   . Fear of Current or Ex-Partner: Not on file  . Emotionally Abused: Not on file  . Physically Abused: Not on file  . Sexually Abused: Not on file    Past Surgical Hx:  Past Surgical History:  Procedure Laterality Date  . HAND LIGAMENT RECONSTRUCTION    . ROBOTIC ASSISTED TOTAL HYSTERECTOMY WITH BILATERAL SALPINGO OOPHERECTOMY N/A 06/20/2020   Procedure: XI ROBOTIC ASSISTED TOTAL HYSTERECTOMY WITH BILATERAL SALPINGO OOPHORECTOMY, RIGHT URETERAL LYSIS;  Surgeon: [name], MD;  Location: [facility];  Service: Gynecology;  Laterality: N/A;  . SENTINEL NODE BIOPSY N/A 06/20/2020   Procedure: SENTINEL NODE BIOPSY;  Surgeon: [name], MD;  Location: [facility];  Service: Gynecology;  Laterality: N/A;    Past Medical Hx:  Past Medical History:  Diagnosis Date  . Cancer ([place])    endometreal ut  . Diabetes mellitus   . GERD (gastroesophageal reflux disease)   . Hypercholesterolemia   . Hypertension   . Neuropathy of both feet   . Plantar fasciitis    Left foot    Past Gynecological History:  See HPI No LMP recorded. Patient is postmenopausal.  Family Hx:  Family History  Problem Relation Age of Onset  . Diabetes Mellitus II Mother   . Stroke Mother   . Hypertension Mother   . Stroke Brother   . Diabetes Mellitus II Brother   . Hypertension Brother   . Cancer Paternal Aunt        ovarian    Review of Systems:  Constitutional  Feels well,    ENT Normal appearing ears and nares bilaterally Skin/Breast  No rash, sores, jaundice, itching, dryness Cardiovascular  No chest pain, shortness of breath, or edema  Pulmonary  No cough or wheeze.  Gastro Intestinal  No nausea, vomitting, or diarrhoea. No bright red blood per rectum, no abdominal pain, change in bowel movement, or constipation.  Genito Urinary  No frequency, urgency, dysuria, no bleeding Musculo Skeletal  No myalgia, arthralgia, joint swelling or pain  Neurologic  No weakness,  numbness, change in gait,  Psychology  No depression, anxiety, insomnia.   Vitals:  Blood pressure 119/75, pulse 100, temperature (!) 97.3 F (36.3 C), temperature source Tympanic, resp. rate 18, weight 189 lb 6.4 oz (85.9 kg), SpO2 99 %.  Physical Exam: WD in NAD Neck  Supple NROM, without any enlargements.  Lymph Node Survey No cervical supraclavicular or inguinal adenopathy Cardiovascular  Pulse normal rate, regularity and rhythm. S1 and S2 normal.  Lungs  Clear to auscultation bilateraly, without wheezes/crackles/rhonchi. Good air movement.  Skin  No rash/lesions/breakdown  Psychiatry  Alert and oriented to person, place, and time  Abdomen  Normoactive bowel sounds, abdomen soft, non-tender and obese without evidence of hernia. Back No CVA tenderness Genito Urinary  Vulva/vagina: Normal external female genitalia.  No lesions. No discharge or bleeding. Vaginal cuff necrotic vaginal cuff with fibrinous tissue. Friability with touching with a Q-tip. Palpably intact the potentially some defect in the mucosal surface. No evisceration. No palpable residual endometrial cancer. Rectal  deferred.  Extremities  No bilateral cyanosis, clubbing or edema.   30 minutes of direct face to face counseling time was spent with the patient. This included discussion about prognosis, therapy recommendations and postoperative side effects and are beyond the scope of routine postoperative care.   Thereasa Solo, MD  07/08/2020, 2:29 PM

## 2020-07-09 ENCOUNTER — Telehealth: Payer: Self-pay

## 2020-07-09 NOTE — Telephone Encounter (Signed)
Told Christy Carpenter that her WBC count is normal now at 7.9. Her Hgb is improving and is at 10.6.  Her kidney function is elevated at 1.23. She needs to drink at least 64 oz of water a day to stay hydrated. Since her kidney function is elevated,she needs to decrease the use of the ibuprofen for pain per Melissa Cross,NP. Pt verbalized understanding.

## 2020-08-12 NOTE — Progress Notes (Signed)
Follow-up Note: Gyn-Onc  Consult was requested by Dr. Juliene Pina for the evaluation of Christy Carpenter 64 y.o. female  CC:  Chief Complaint  Patient presents with  . Endometrial cancer [name])    Assessment/Plan:  Christy Carpenter  is a 64 y.o.  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  1/ endometrial cancer:  Pathology revealed low risk factors for recurrence, therefore no adjuvant therapy is recommended according to NCCN guidelines.  I discussed risk for recurrence and typical symptoms encouraged her to notify us of these should they develop between visits.  I recommend she have follow-up every 6 months for 5 years in accordance with NCCN guidelines. Those visits should include symptom assessment, physical exam and pelvic examination. Pap smears are not indicated or recommended in the routine surveillance of endometrial cancer.  2/ history of postop pelvic fluid collection: clinical resolution on exam with well healed vaginal cuff.  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  The patient reported a 1 year history of postmenopausal bleeding. She attributed this bleeding to having a retained IUD. She did not have an OBGYN provider and so was not being evaluated. She reported only seeing her endocrinologist for surveillance of her diabetes on a regular basis. She has a PCP, however does not see that provider at regularly scheduled visits. She brought attention to the symptom of postmenopausal spotting to her endocrinologist on 01/19/20 who recommended OBGYN work up.  The patient saw Dr Juliene Pina on January 29, 2020 and a Pap smear was performed which was cytologically normal and negative for high-risk HPV.  Additionally she underwent ultrasound evaluation of the endometrium at that date which revealed a uterus of normal size, the  endometrium is heterogeneous and thickened with vascularity and measured 27.3 mm.  A Lippe's Loop IUD was noted.  It was within the endometrial cavity.  There were normal ovaries and no adnexal masses.  She declined endometrial biopsy on that same day and returned at a separate visit on February 14, 2020 for endometrial sampling with a Pipelle biopsy.  This revealed FIGO grade 1 endometrioid adenocarcinoma.  Her surgery was initially delayed due to very poor diabetes control (HbAc1 of 10.7%) and her cancer was treated medically with Megace. She was prescribed megace which facilitated increased food consumption due to appetite stimulation.  Her HbA1c was rechecked in September and had increased to 11.1%. However, her endocrinologist prescribed a freestyle libre continuous blood glucose monitoring system and with this she has stopped having spikes in blood glucose that were too high to read (>600). According to the patient, these were happening regularly before September, 2021.   On June 20, 2020 she underwent robotic assisted total hysterectomy, BSO, sentinel lymph node biopsy, right ureterolysis. Intraoperative findings were significant for a 10 cm uterus with no gross extrauterine disease. Extreme visceral adiposity making visualization of pelvic strip structures limited. There was evidence of stage IV endometriosis with a right ovarian chocolate cyst densely adherent to the right uterosacral ligament and the rectovaginal septum with adherence to the posterior cervix. 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 Surgery was challenging due to morbid obesity and dense adhesions from prior infection or endometriosis.  Final pathology revealed a FIGO grade 1 endometrioid endometrial adenocarcinoma. The tumor measured 1.5 cm.  There was one of 28 mm (less than one half) myometrial invasion. There was no LVSI. The cervix and adnexa were negative  for metastatic disease. The sentinel lymph nodes were negative for metastatic disease. MMR testing was normal, MSI testing was stable. She was characterizes FIGO stage Ia grade 1 endometrioid endometrial adenocarcinoma with low risk features for recurrence. In accordance with NCCN guidelines no adjuvant therapy was recommended.Christy Carpenter  She was admitted on POD 3 for bacteremia and sepsis. A small (3cm) fluid collection was identified at the vaginal cuff which was not rim enhancing, not amenable to drainage and did not increase in size over a 5 day period. No other clear source of infection was noted. She was treated with empiric antibiotics and discharged on oral antibiotics when she had remained afebrile with a normalizing WBC.  Interval Hx:  She had some vaginal cuff separation/non healing cuff at the postop check. She reported that drainage and discharge had resolved. She continued to report shortness of breath on exertion. Mild.  She has new symptoms of a left thumb trigger finger.    Current Meds:  Outpatient Encounter Medications as of 08/15/2020  Medication Sig  . amitriptyline (ELAVIL) 25 MG tablet Take 25 mg by mouth at bedtime.  Christy Carpenter amoxicillin-clavulanate (AUGMENTIN) 875-125 MG tablet Take 1 tablet by mouth 2 (two) times daily.  Christy Carpenter atorvastatin (LIPITOR) 40 MG tablet Take 1 tablet (40 mg total) by mouth daily at 6 PM.  . Continuous Blood Gluc Sensor (FREESTYLE LIBRE 2 SENSOR) MISC   . dapagliflozin propanediol (FARXIGA) 10 MG TABS tablet Take 10 mg by mouth daily.   . fenofibrate micronized (LOFIBRA) 134 MG capsule Take 134 mg by mouth daily.  Christy Carpenter ibuprofen (ADVIL) 200 MG tablet Take 200-400 mg by mouth every 6 (six) hours as needed.  Christy Carpenter levothyroxine (SYNTHROID) 50 MCG tablet Take 50 mcg by mouth daily before breakfast.  . losartan-hydrochlorothiazide (HYZAAR) 100-25 MG tablet Take 1 tablet by mouth daily.  Christy Carpenter NOVOLIN N FLEXPEN 100 UNIT/ML Kiwkpen Inject 75 Units into the skin 3 (three) times  daily.   Christy Carpenter NOVOLIN R FLEXPEN 100 UNIT/ML SOPN Inject 75 Units into the skin 3 (three) times daily.   . pregabalin (LYRICA) 150 MG capsule Take 150 mg by mouth 3 (three) times daily.  (Patient not taking: Reported on 07/08/2020)  . senna-docusate (SENOKOT-S) 8.6-50 MG tablet Take 2 tablets by mouth at bedtime. For AFTER surgery, do not take if having diarrhea (Patient not taking: Reported on 07/08/2020)  . SitaGLIPtin-MetFORMIN HCl (JANUMET XR) 50-1000 MG TB24 Take 1 tablet by mouth 2 (two) times daily.  . traMADol (ULTRAM) 50 MG tablet Take 1 tablet (50 mg total) by mouth every 6 (six) hours as needed for severe pain.   No facility-administered encounter medications on file as of 08/15/2020.    Allergy:  Allergies  Allergen Reactions  . Byetta 10 Mcg Pen [Exenatide] Itching  . Januvia [Sitagliptin]     Yeast infections   . Invokana [Canagliflozin] Other (See Comments)    Yeast infections    Social Hx:   Social History   Socioeconomic History  . Marital status: Married    Spouse name: Not on file  . Number of children: Not on file  . Years of education: Not on file  . Highest education level: Not on file  Occupational History  . Not on file  Tobacco Use  . Smoking status: Never Smoker  . Smokeless tobacco: Never Used  Vaping Use  . Vaping  Use: Never used  Substance and Sexual Activity  . Alcohol use: No  . Drug use: No  . Sexual activity: Not on file  Other Topics Concern  . Not on file  Social History Narrative   Lives home with spouse.  Education Masters Degree.  Works at [employer].  2 Children.   Caffeine 1 cup coffee daily.    Social Determinants of Health   Financial Resource Strain: Not on file  Food Insecurity: Not on file  Transportation Needs: Not on file  Physical Activity: Not on file  Stress: Not on file  Social Connections: Not on file  Intimate Partner Violence: Not on file    Past Surgical Hx:  Past Surgical History:  Procedure Laterality Date  .  HAND LIGAMENT RECONSTRUCTION    . ROBOTIC ASSISTED TOTAL HYSTERECTOMY WITH BILATERAL SALPINGO OOPHERECTOMY N/A 06/20/2020   Procedure: XI ROBOTIC ASSISTED TOTAL HYSTERECTOMY WITH BILATERAL SALPINGO OOPHORECTOMY, RIGHT URETERAL LYSIS;  Surgeon: [name], MD;  Location: [facility];  Service: Gynecology;  Laterality: N/A;  . SENTINEL NODE BIOPSY N/A 06/20/2020   Procedure: SENTINEL NODE BIOPSY;  Surgeon: [name], MD;  Location: [facility];  Service: Gynecology;  Laterality: N/A;    Past Medical Hx:  Past Medical History:  Diagnosis Date  . Cancer ([place])    endometreal ut  . Diabetes mellitus   . GERD (gastroesophageal reflux disease)   . Hypercholesterolemia   . Hypertension   . Neuropathy of both feet   . Plantar fasciitis    Left foot    Past Gynecological History:  See HPI No LMP recorded. Patient is postmenopausal.  Family Hx:  Family History  Problem Relation Age of Onset  . Diabetes Mellitus II Mother   . Stroke Mother   . Hypertension Mother   . Stroke Brother   . Diabetes Mellitus II Brother   . Hypertension Brother   . Cancer Paternal Aunt        ovarian    Review of Systems:  Constitutional  Feels well,    ENT Normal appearing ears and nares bilaterally Skin/Breast  No rash, sores, jaundice, itching, dryness Cardiovascular  No chest pain, shortness of breath, or edema  Pulmonary  No cough or wheeze.  Gastro Intestinal  No nausea, vomitting, or diarrhoea. No bright red blood per rectum, no abdominal pain, change in bowel movement, or constipation.  Genito Urinary  No frequency, urgency, dysuria, no bleeding Musculo Skeletal  No myalgia, arthralgia, joint swelling or pain  Neurologic  No weakness, numbness, change in gait,  Psychology  No depression, anxiety, insomnia.   Vitals:  Blood pressure 135/87, pulse (!) 111, temperature (!) 97.3 F (36.3 C), temperature source Tympanic, resp. rate 18, height 5' (1.524 m), weight 192 lb 3.2 oz (87.2 kg), SpO2 100  %.  Physical Exam: WD in NAD Neck  Supple NROM, without any enlargements.  Lymph Node Survey No cervical supraclavicular or inguinal adenopathy Cardiovascular  Pulse normal rate, regularity and rhythm. S1 and S2 normal.  Lungs  Clear to auscultation bilateraly, without wheezes/crackles/rhonchi. Good air movement.  Skin  No rash/lesions/breakdown  Psychiatry  Alert and oriented to person, place, and time  Abdomen  Normoactive bowel sounds, abdomen soft, non-tender and obese without evidence of hernia. Back No CVA tenderness Genito Urinary  Vulva/vagina: Normal external female genitalia.  No lesions. No discharge or bleeding. Vaginal cuff necrotic vaginal cuff with fibrinous tissue. In tact cuff with no friability. No palpable residual endometrial cancer. Rectal  deferred.  Extremities  No bilateral cyanosis, clubbing or edema.   Thereasa Solo, MD  08/15/2020, 3:57 PM

## 2020-08-15 ENCOUNTER — Encounter: Payer: Self-pay | Admitting: Gynecologic Oncology

## 2020-08-15 ENCOUNTER — Inpatient Hospital Stay: Payer: BC Managed Care – PPO | Attending: Gynecologic Oncology | Admitting: Gynecologic Oncology

## 2020-08-15 ENCOUNTER — Other Ambulatory Visit: Payer: Self-pay

## 2020-08-15 VITALS — BP 135/87 | HR 111 | Temp 97.3°F | Resp 18 | Ht 60.0 in | Wt 192.2 lb

## 2020-08-15 DIAGNOSIS — C541 Malignant neoplasm of endometrium: Secondary | ICD-10-CM | POA: Insufficient documentation

## 2020-08-15 DIAGNOSIS — Z90722 Acquired absence of ovaries, bilateral: Secondary | ICD-10-CM | POA: Diagnosis not present

## 2020-08-15 DIAGNOSIS — Z90710 Acquired absence of both cervix and uterus: Secondary | ICD-10-CM | POA: Insufficient documentation

## 2020-08-15 NOTE — Patient Instructions (Signed)
Dr Andrey Farmer recommends discussing your trigger finger with your hand surgeon.  Dr Andrey Farmer recommends follow-up with her in 6 months to monitor the cancer.  Please notify Dr Andrey Farmer at phone number [phone] if you notice vaginal bleeding, new pelvic or abdominal pains, bloating, feeling full easy, or a change in bladder or bowel function.   If your shortness of breath becomes worse, then notify Dr Andrey Farmer and she will order a chest xray or lab test to check for your blood levels.

## 2020-09-05 ENCOUNTER — Other Ambulatory Visit: Payer: Self-pay | Admitting: Gynecologic Oncology

## 2020-09-05 ENCOUNTER — Telehealth: Payer: Self-pay | Admitting: *Deleted

## 2020-09-05 DIAGNOSIS — C541 Malignant neoplasm of endometrium: Secondary | ICD-10-CM

## 2020-09-05 DIAGNOSIS — R1084 Generalized abdominal pain: Secondary | ICD-10-CM

## 2020-09-05 NOTE — Telephone Encounter (Signed)
  Melissa Cross,NP said a CT scan of the abdomen and pelvis. The question is how soon can we get it esp with the weekend coming up etc and with her pain at a 9 out of 10 with contrast. She may need some labs before. I would gauge whether u feel like her pain is that severe and whether she would need to go to the ER if it is that bad.  Ms Jester does not want to go to ED. She wants CT scheduled and will call tomorrow to schedule labs if the weather is not bad.   Reviewed with Dr. Denman George as well and She put in order for CT scan and B-met for kidney function prior to CT.

## 2020-09-05 NOTE — Progress Notes (Signed)
Patient having abdominal pain. S/p hysterectomy for endometrial cancer. Will order CT to evaluate for recurrent disease vs persistent abscess as a source.  [name]

## 2020-09-05 NOTE — Telephone Encounter (Signed)
Patient called the office stated " I am having pain in my lower bilateral area for the last three weeks. I have tried tylenol, ibuprofen and gas-x with no relief. My pain level is at a 9. The patient is sharp and dull but constant; with some tingling. I have no vaginal bleeding, change in my bowels or bladder, fever or bloating. It is worse at night. I do get a fullness feeling easy with eating and sometimes that makes it worse." Explained that I would give the message to the nurse and she will call her bck.

## 2020-09-06 NOTE — Telephone Encounter (Signed)
LM for Christy Carpenter to call back to the office at [phone] to discuss the CT scan appointment on Wednesday 09-11-20 at [place]

## 2020-09-09 ENCOUNTER — Telehealth: Payer: Self-pay

## 2020-09-09 NOTE — Telephone Encounter (Signed)
Told ms Maricle that CT Scan was set up for Wednesday 09-11-20 at [facility]. She needs to come in for labs prior. Ms Ruffino stated that since last Thursday she has hd some vomiting and diarrhea.  She has taken imodium AD and her abdomen feels much better Afebrile. She would like to cancel the scan at this time and she will call back if her symptoms increase. CT Scan cancelled as requested by Ms Simeon Craft.

## 2020-09-11 ENCOUNTER — Ambulatory Visit (HOSPITAL_BASED_OUTPATIENT_CLINIC_OR_DEPARTMENT_OTHER): Payer: BC Managed Care – PPO

## 2020-09-27 ENCOUNTER — Emergency Department (HOSPITAL_COMMUNITY)
Admission: EM | Admit: 2020-09-27 | Discharge: 2020-09-28 | Disposition: A | Discharge status: Home / Self Care | Payer: BC Managed Care – PPO | Attending: Emergency Medicine | Admitting: Emergency Medicine

## 2020-09-27 ENCOUNTER — Emergency Department (HOSPITAL_COMMUNITY): Payer: BC Managed Care – PPO

## 2020-09-27 ENCOUNTER — Encounter (HOSPITAL_COMMUNITY): Payer: Self-pay

## 2020-09-27 ENCOUNTER — Other Ambulatory Visit: Payer: Self-pay

## 2020-09-27 DIAGNOSIS — Z794 Long term (current) use of insulin: Secondary | ICD-10-CM | POA: Insufficient documentation

## 2020-09-27 DIAGNOSIS — R1084 Generalized abdominal pain: Secondary | ICD-10-CM | POA: Diagnosis not present

## 2020-09-27 DIAGNOSIS — E1165 Type 2 diabetes mellitus with hyperglycemia: Secondary | ICD-10-CM | POA: Insufficient documentation

## 2020-09-27 DIAGNOSIS — Z7984 Long term (current) use of oral hypoglycemic drugs: Secondary | ICD-10-CM | POA: Diagnosis not present

## 2020-09-27 DIAGNOSIS — M549 Dorsalgia, unspecified: Secondary | ICD-10-CM | POA: Insufficient documentation

## 2020-09-27 DIAGNOSIS — Z90711 Acquired absence of uterus with remaining cervical stump: Secondary | ICD-10-CM | POA: Insufficient documentation

## 2020-09-27 DIAGNOSIS — E039 Hypothyroidism, unspecified: Secondary | ICD-10-CM | POA: Diagnosis not present

## 2020-09-27 DIAGNOSIS — K219 Gastro-esophageal reflux disease without esophagitis: Secondary | ICD-10-CM | POA: Insufficient documentation

## 2020-09-27 DIAGNOSIS — Z79899 Other long term (current) drug therapy: Secondary | ICD-10-CM | POA: Insufficient documentation

## 2020-09-27 DIAGNOSIS — E1140 Type 2 diabetes mellitus with diabetic neuropathy, unspecified: Secondary | ICD-10-CM | POA: Diagnosis not present

## 2020-09-27 DIAGNOSIS — R1032 Left lower quadrant pain: Secondary | ICD-10-CM | POA: Diagnosis present

## 2020-09-27 DIAGNOSIS — Z859 Personal history of malignant neoplasm, unspecified: Secondary | ICD-10-CM | POA: Insufficient documentation

## 2020-09-27 DIAGNOSIS — I10 Essential (primary) hypertension: Secondary | ICD-10-CM | POA: Insufficient documentation

## 2020-09-27 LAB — CBC
HCT: 33.4 % — ABNORMAL LOW (ref 36.0–46.0)
Hemoglobin: 11.4 g/dL — ABNORMAL LOW (ref 12.0–15.0)
MCH: 29.2 pg (ref 26.0–34.0)
MCHC: 34.1 g/dL (ref 30.0–36.0)
MCV: 85.6 fL (ref 80.0–100.0)
Platelets: 212 10*3/uL (ref 150–400)
RBC: 3.9 MIL/uL (ref 3.87–5.11)
RDW: 13.7 % (ref 11.5–15.5)
WBC: 5 10*3/uL (ref 4.0–10.5)
nRBC: 0 % (ref 0.0–0.2)

## 2020-09-27 LAB — COMPREHENSIVE METABOLIC PANEL
ALT: 26 U/L (ref 0–44)
AST: 31 U/L (ref 15–41)
Albumin: 4 g/dL (ref 3.5–5.0)
Alkaline Phosphatase: 57 U/L (ref 38–126)
Anion gap: 12 (ref 5–15)
BUN: 23 mg/dL (ref 8–23)
CO2: 22 mmol/L (ref 22–32)
Calcium: 9.4 mg/dL (ref 8.9–10.3)
Chloride: 104 mmol/L (ref 98–111)
Creatinine, Ser: 0.97 mg/dL (ref 0.44–1.00)
GFR, Estimated: >60 mL/min (ref >60)
Glucose, Bld: 230 mg/dL — ABNORMAL HIGH (ref 70–99)
Potassium: 4.1 mmol/L (ref 3.5–5.1)
Sodium: 138 mmol/L (ref 135–145)
Total Bilirubin: 0.6 mg/dL (ref 0.3–1.2)
Total Protein: 8 g/dL (ref 6.5–8.1)

## 2020-09-27 LAB — LIPASE, BLOOD: Lipase: 36 U/L (ref 11–51)

## 2020-09-27 MED ORDER — OXYCODONE-ACETAMINOPHEN 5-325 MG PO TABS
2.0000 | ORAL_TABLET | Freq: Once | ORAL | Status: AC
  Administered 2020-09-28: 2 via ORAL
  Filled 2020-09-27: qty 2

## 2020-09-27 MED ORDER — IOHEXOL 300 MG/ML  SOLN
100.0000 mL | Freq: Once | INTRAMUSCULAR | Status: AC | PRN
  Administered 2020-09-27: 100 mL via INTRAVENOUS

## 2020-09-27 NOTE — ED Triage Notes (Signed)
Pt arrived via walk in, c/o left sided flank pain since November after hysterectomy.  Denies any n/v or diarrhea.

## 2020-09-27 NOTE — Discharge Instructions (Addendum)
All the results in the ER are normal, labs and imaging. We are not sure what is causing your symptoms. The workup in the ER is not complete, and is limited to screening for life threatening and emergent conditions only, so please see your primary care doctor or Dr. Denman George.

## 2020-09-27 NOTE — ED Notes (Signed)
Pt said she doesn't need to use the bathroom and she doesn't think she will be able to.

## 2020-09-27 NOTE — ED Provider Notes (Signed)
[facility] DEPT Provider Note   CSN: [id number] Arrival date & time: 09/27/20  1128     History Chief Complaint  Patient presents with  . Flank Pain    Christy Carpenter is a 65 y.o. female.  HPI    65 year old female comes in a chief complaint of flank pain. In November, patient had total hysterectomy.  Subsequently she developed intra-abdominal abscess and sepsis.  Patient did not get abscess drained, she was receiving IV antibiotics.  She reports that since her discharge she has never felt normal.  She has continued to have lower quadrant abdominal pain, back pain.  More recently the pain is gotten more intense.  She had an outpatient CT ordered, but she was unable to follow-up because of poor weather.  Review of system is negative for any fevers, chills.  No vaginal discharge or bleeding.  Past Medical History:  Diagnosis Date  . Cancer ([place])    endometreal ut  . Diabetes mellitus   . GERD (gastroesophageal reflux disease)   . Hypercholesterolemia   . Hypertension   . Neuropathy of both feet   . Plantar fasciitis    Left foot    Patient Active Problem List   Diagnosis Date Noted  . Bacteremia 06/28/2020  . Hypothyroidism   . Gastroesophageal reflux disease   . Neuropathy   . Pelvic fluid collection   . Type 2 diabetes mellitus with hyperlipidemia ([place])   . SIRS due to infectious process with acute organ dysfunction ([place]) 06/24/2020  . SIRS (systemic inflammatory response syndrome) ([place]) 06/24/2020  . 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 Endometriosis   . Retroperitoneal fibrosis   . Diabetic neuropathy ([place]) 02/23/2020  . Endometrial cancer ([place]) 02/23/2020  . Obesity (BMI 30.0-34.9) 02/23/2020  . Chest pain 08/19/2017  . Uncontrolled type 2 diabetes mellitus with hyperglycemia ([place]) 08/19/2017  . Essential hypertension 08/19/2017  . Hypercholesterolemia     Past Surgical History:  Procedure Laterality Date  . HAND  LIGAMENT RECONSTRUCTION    . ROBOTIC ASSISTED TOTAL HYSTERECTOMY WITH BILATERAL SALPINGO OOPHERECTOMY N/A 06/20/2020   Procedure: XI ROBOTIC ASSISTED TOTAL HYSTERECTOMY WITH BILATERAL SALPINGO OOPHORECTOMY, RIGHT URETERAL LYSIS;  Surgeon: [name], MD;  Location: [facility];  Service: Gynecology;  Laterality: N/A;  . SENTINEL NODE BIOPSY N/A 06/20/2020   Procedure: SENTINEL NODE BIOPSY;  Surgeon: [name], MD;  Location: [facility];  Service: Gynecology;  Laterality: N/A;     OB History   No obstetric history on file.     Family History  Problem Relation Age of Onset  . Diabetes Mellitus II Mother   . Stroke Mother   . Hypertension Mother   . Stroke Brother   . Diabetes Mellitus II Brother   . Hypertension Brother   . Cancer Paternal Aunt        ovarian    Social History   Tobacco Use  . Smoking status: Never Smoker  . Smokeless tobacco: Never Used  Vaping Use  . Vaping Use: Never used  Substance Use Topics  . Alcohol use: No  . Drug use: No    Home Medications Prior to Admission medications   Medication Sig Start Date End Date Taking? Authorizing Provider  amitriptyline (ELAVIL) 25 MG tablet Take 25 mg by mouth at bedtime.    [provider]  amoxicillin-clavulanate (AUGMENTIN) 875-125 MG tablet Take 1 tablet by mouth 2 (two) times daily. 07/08/20   [name], MD  atorvastatin (LIPITOR) 40 MG tablet Take  1 tablet (40 mg total) by mouth daily at 6 PM. 08/20/17   Kerney Elbe, DO  Continuous Blood Gluc Sensor (FREESTYLE LIBRE 2 SENSOR) MISC  06/13/20   [provider]  dapagliflozin propanediol (FARXIGA) 10 MG TABS tablet Take 10 mg by mouth daily.     [provider]  fenofibrate micronized (LOFIBRA) 134 MG capsule Take 134 mg by mouth daily.    [provider]  ibuprofen (ADVIL) 200 MG tablet Take 200-400 mg by mouth every 6 (six) hours as needed.    [provider]  levothyroxine (SYNTHROID) 50 MCG tablet Take 50 mcg by  mouth daily before breakfast.    [provider]  losartan-hydrochlorothiazide (HYZAAR) 100-25 MG tablet Take 1 tablet by mouth daily.    [provider]  NOVOLIN N FLEXPEN 100 UNIT/ML Kiwkpen Inject 75 Units into the skin 3 (three) times daily.  04/17/20   [provider]  NOVOLIN R FLEXPEN 100 UNIT/ML SOPN Inject 75 Units into the skin 3 (three) times daily.  04/17/20   [provider]  pregabalin (LYRICA) 150 MG capsule Take 150 mg by mouth 3 (three) times daily.  Patient not taking: Reported on 07/08/2020    [provider]  senna-docusate (SENOKOT-S) 8.6-50 MG tablet Take 2 tablets by mouth at bedtime. For AFTER surgery, do not take if having diarrhea Patient not taking: Reported on 07/08/2020 06/21/20   Joylene John D, NP  SitaGLIPtin-MetFORMIN HCl (JANUMET XR) 50-1000 MG TB24 Take 1 tablet by mouth 2 (two) times daily.    [provider]  traMADol (ULTRAM) 50 MG tablet Take 1 tablet (50 mg total) by mouth every 6 (six) hours as needed for severe pain. 07/08/20   [name], MD    Allergies    Byetta 10 mcg pen [exenatide], Dapagliflozin, Januvia [sitagliptin], and Invokana [canagliflozin]  Review of Systems   Review of Systems  Constitutional: Positive for activity change.  Respiratory: Negative for shortness of breath.   Cardiovascular: Negative for chest pain.  Gastrointestinal: Positive for abdominal pain.  Allergic/Immunologic: Negative for immunocompromised state.  Hematological: Does not bruise/bleed easily.  All other systems reviewed and are negative.   Physical Exam Updated Vital Signs BP (!) 146/79 (BP Location: Right Arm)   Pulse 80   Temp 98.2 F (36.8 C) (Oral)   Resp 15   SpO2 99%   Physical Exam Vitals and nursing note reviewed.  Constitutional:      Appearance: She is well-developed.  HENT:     Head: Normocephalic and atraumatic.  Eyes:     Extraocular Movements: EOM normal.  Cardiovascular:      Rate and Rhythm: Normal rate.  Pulmonary:     Effort: Pulmonary effort is normal.  Abdominal:     General: Bowel sounds are normal.     Tenderness: There is abdominal tenderness.     Comments: Lower quadrant abdominal tenderness with flank tenderness.  Musculoskeletal:     Cervical back: Normal range of motion and neck supple.  Skin:    General: Skin is warm and dry.  Neurological:     Mental Status: She is alert and oriented to person, place, and time.     ED Results / Procedures / Treatments   Labs (all labs ordered are listed, but only abnormal results are displayed) Labs Reviewed  COMPREHENSIVE METABOLIC PANEL - Abnormal; Notable for the following components:      Result Value   Glucose, Bld 230 (*)    All  other components within normal limits  CBC - Abnormal; Notable for the following components:   Hemoglobin 11.4 (*)    HCT 33.4 (*)    All other components within normal limits  LIPASE, BLOOD  URINALYSIS, ROUTINE W REFLEX MICROSCOPIC    EKG None  Radiology CT ABDOMEN PELVIS W CONTRAST  Result Date: 09/27/2020 CLINICAL DATA:  Previously seen postoperative abscess post TAH-BSO, now with lower abdominal, left flank and back pain. EXAM: CT ABDOMEN AND PELVIS WITH CONTRAST TECHNIQUE: Multidetector CT imaging of the abdomen and pelvis was performed using the standard protocol following bolus administration of intravenous contrast. CONTRAST:  13mL OMNIPAQUE IOHEXOL 300 MG/ML  SOLN COMPARISON:  CT 06/27/2020 FINDINGS: Lower chest: Resolution of the previously seen basilar pleural effusions. Minimal atelectatic changes in the otherwise clear lung bases. Normal heart size. No pericardial effusion. Coronary artery calcifications are present. Hepatobiliary: Diffuse hepatic hypoattenuation compatible with hepatic steatosis. Sparing is seen along the gallbladder fossa. Normal gallbladder and biliary tree without visible calcified gallstone, biliary dilatation or pericholecystic  inflammation. Pancreas: No pancreatic ductal dilatation or surrounding inflammatory changes. Spleen: Normal in size. No concerning splenic lesions. Adrenals/Urinary Tract: Normal adrenal glands. Kidneys are normally located with symmetric enhancementand excretion. Slight asymmetric dilatation of the right collecting system including at the pelvis and mid ureter, is in a similar configuration to prior albeit without acute findings at this time. Similar to priors. Few subcentimeter hypoattenuating foci in the kidneys too small to fully characterize on CT imaging but statistically likely benign. No suspicious renal lesion, urolithiasis or hydronephrosis. Urinary bladder is unremarkable for the degree of distention. Stomach/Bowel: Distal esophagus, stomach and duodenum are unremarkable. No small bowel thickening or dilatation. Normal appendix coils about the cecal tip. Moderate colonic stool burden. Redundancy of the transverse and sigmoid colon, similar to priors. No evidence of obstruction or volvulus. Vascular/Lymphatic: Atherosclerotic calcifications within the abdominal aorta and branch vessels. No aneurysm or ectasia. No enlarged abdominopelvic lymph nodes. Reproductive: Prior total abdominal hysterectomy and bilateral salpingo oophorectomy. Solitary focus of gas is seen within the vaginal cuff. No complex residual abscess or collection is seen along the operative line. No worrisome adnexal lesions are present. Other: No abdominopelvic free air or fluid. No bowel containing hernias. Small fat containing umbilical hernia. Mild posterior body wall edema. Mild subcutaneous stranding and skin thickening along the anterior abdominal wall likely related to prior check double use. Musculoskeletal: 1 grade 1 anterolisthesis L4 on 5 without spondylolysis. Multilevel discogenic and facet degenerative changes are present maximal L4-S1. Disc bulging and facet arthropathic changes at these levels result in some mild to  moderate canal stenosis and moderate to severe bilateral foraminal narrowing. Additional calcified right central disc protrusion L1-2 with mild resulting canal stenosis and right foraminal narrowing. More mild degenerative changes seen elsewhere IMPRESSION: 1. Prior total abdominal hysterectomy and bilateral salpingo oophorectomy. No complex residual abscess or collection is seen at the operative site. A solitary focus of gas is seen within the vaginal canal below the vaginal cuff, nonspecific and often a physiologic finding. Correlate with pelvic exam as clinically warranted. 2. Minimal residual dilatation of the right urinary tract without features of gross obstructive uropathy at this time. 3. Hepatic steatosis. 4. Moderate colonic stool burden with redundant transverse and sigmoid colon, could correlate for symptoms of constipation. 5. Multilevel degenerative changes of the lumbar spine, maximal L4-S1, as described above. 6. Resolution of the previously seen basilar pleural effusions. 7. Aortic Atherosclerosis (ICD10-I70.0). Electronically Signed   By: March Rummage  [facility] M.D.   On: 09/27/2020 22:52    Procedures Procedures   Medications Ordered in ED Medications  iohexol (OMNIPAQUE) 300 MG/ML solution 100 mL (100 mLs Intravenous Contrast Given 09/27/20 2226)    ED Course  I have reviewed the triage vital signs and the nursing notes.  Pertinent labs & imaging results that were available during my care of the patient were reviewed by me and considered in my medical decision making (see chart for details).    MDM Rules/Calculators/A&P                          64 year old female comes in a chief complaint of worsening abdominal pain.  She is postop from total hysterectomy which was complicated with intra-abdominal abscess.  The abscess was never drained, it appears that she got better with IV antibiotics.  Given that she is having worsening abdominal pain, we will get a CT scan to ensure there is no  seroma, walled off infection/abscess, hematoma.  If CT is negative will discharge.  Final Clinical Impression(s) / ED Diagnoses Final diagnoses:  Generalized abdominal pain    Rx / DC Orders ED Discharge Orders    None       Varney Biles, MD 09/27/20 [phone]

## 2020-09-28 NOTE — ED Notes (Signed)
Patient and spouse said "this is inhumane that this room is this cold." Patient and spouse were provided with multiple warm blankets. Patient also upset that she could not take home the unopened pain medication that she was receiving before discharge. Patient agreed to taking the pain medication before discharge.

## 2020-12-02 ENCOUNTER — Encounter (HOSPITAL_COMMUNITY): Payer: Self-pay

## 2020-12-02 ENCOUNTER — Ambulatory Visit (HOSPITAL_COMMUNITY)
Admission: EM | Admit: 2020-12-02 | Discharge: 2020-12-02 | Disposition: A | Discharge status: Home / Self Care | Payer: BC Managed Care – PPO

## 2020-12-02 ENCOUNTER — Other Ambulatory Visit: Payer: Self-pay

## 2020-12-02 DIAGNOSIS — M545 Low back pain, unspecified: Secondary | ICD-10-CM | POA: Diagnosis not present

## 2020-12-02 DIAGNOSIS — S46812A Strain of other muscles, fascia and tendons at shoulder and upper arm level, left arm, initial encounter: Secondary | ICD-10-CM | POA: Diagnosis not present

## 2020-12-02 HISTORY — DX: Ileus, unspecified: K56.7

## 2020-12-02 MED ORDER — TIZANIDINE HCL 2 MG PO TABS: 2.0000 mg | ORAL_TABLET | Freq: Three times a day (TID) | ORAL | 0 refills | Status: DC | PRN

## 2020-12-02 MED ORDER — MELOXICAM 7.5 MG PO TABS: 7.5000 mg | ORAL_TABLET | Freq: Every day | ORAL | 0 refills | Status: DC
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
## 2021-02-03 ENCOUNTER — Telehealth: Payer: Self-pay

## 2021-02-03 ENCOUNTER — Inpatient Hospital Stay: Payer: BC Managed Care – PPO | Admitting: Gynecologic Oncology

## 2021-02-03 NOTE — Telephone Encounter (Signed)
LM for Christy Carpenter stating that the after hours message was received to cancel today's appointment. Requested that she call back to the office to reschedule at her convince.

## 2021-02-10 ENCOUNTER — Other Ambulatory Visit: Payer: Self-pay | Admitting: Family Medicine

## 2021-02-10 ENCOUNTER — Ambulatory Visit
Admission: RE | Admit: 2021-02-10 | Discharge: 2021-02-10 | Disposition: A | Discharge status: Home / Self Care | Payer: BC Managed Care – PPO | Source: Ambulatory Visit | Attending: Family Medicine | Admitting: Family Medicine

## 2021-02-10 DIAGNOSIS — M5442 Lumbago with sciatica, left side: Secondary | ICD-10-CM

## 2021-03-19 ENCOUNTER — Ambulatory Visit (HOSPITAL_BASED_OUTPATIENT_CLINIC_OR_DEPARTMENT_OTHER): Payer: BC Managed Care – PPO | Admitting: Physical Therapy

## 2021-03-26 ENCOUNTER — Ambulatory Visit (HOSPITAL_BASED_OUTPATIENT_CLINIC_OR_DEPARTMENT_OTHER): Payer: BC Managed Care – PPO | Attending: Orthopedic Surgery | Admitting: Physical Therapy

## 2021-03-26 ENCOUNTER — Encounter (HOSPITAL_BASED_OUTPATIENT_CLINIC_OR_DEPARTMENT_OTHER): Payer: Self-pay | Admitting: Physical Therapy

## 2021-03-26 ENCOUNTER — Other Ambulatory Visit: Payer: Self-pay

## 2021-03-26 DIAGNOSIS — M25552 Pain in left hip: Secondary | ICD-10-CM | POA: Insufficient documentation

## 2021-03-26 DIAGNOSIS — M545 Low back pain, unspecified: Secondary | ICD-10-CM | POA: Diagnosis present

## 2021-03-26 DIAGNOSIS — R296 Repeated falls: Secondary | ICD-10-CM | POA: Diagnosis not present

## 2021-03-26 DIAGNOSIS — R2689 Other abnormalities of gait and mobility: Secondary | ICD-10-CM | POA: Diagnosis not present
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
## 2021-04-02 ENCOUNTER — Ambulatory Visit (HOSPITAL_BASED_OUTPATIENT_CLINIC_OR_DEPARTMENT_OTHER): Payer: BC Managed Care – PPO | Admitting: Physical Therapy

## 2021-04-02 ENCOUNTER — Encounter (HOSPITAL_BASED_OUTPATIENT_CLINIC_OR_DEPARTMENT_OTHER): Payer: Self-pay | Admitting: Physical Therapy

## 2021-04-02 ENCOUNTER — Other Ambulatory Visit: Payer: Self-pay

## 2021-04-02 DIAGNOSIS — M25552 Pain in left hip: Secondary | ICD-10-CM

## 2021-04-02 DIAGNOSIS — M545 Low back pain, unspecified: Secondary | ICD-10-CM | POA: Diagnosis not present

## 2021-04-02 DIAGNOSIS — R2689 Other abnormalities of gait and mobility: Secondary | ICD-10-CM
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
## 2021-04-08 ENCOUNTER — Ambulatory Visit (HOSPITAL_BASED_OUTPATIENT_CLINIC_OR_DEPARTMENT_OTHER): Payer: BC Managed Care – PPO | Admitting: Physical Therapy

## 2021-04-08 ENCOUNTER — Other Ambulatory Visit: Payer: Self-pay

## 2021-04-08 ENCOUNTER — Encounter (HOSPITAL_BASED_OUTPATIENT_CLINIC_OR_DEPARTMENT_OTHER): Payer: Self-pay | Admitting: Physical Therapy

## 2021-04-08 DIAGNOSIS — M545 Low back pain, unspecified: Secondary | ICD-10-CM

## 2021-04-08 DIAGNOSIS — M25552 Pain in left hip: Secondary | ICD-10-CM

## 2021-04-08 DIAGNOSIS — R2689 Other abnormalities of gait and mobility: Secondary | ICD-10-CM

## 2021-04-08 NOTE — Therapy (Signed)
[facility] [address] [place], [place], [address] Phone: [phone]   Fax:  [phone]  Physical Therapy Treatment  Patient Details  Name: Christy Carpenter MRN: [id number] Date of Birth: 30-Jul-1956 Referring Provider (PT): Nelson Chimes PA-C   Encounter Date: 04/08/2021   PT End of Session - 04/08/21 1153     Visit Number 3    Number of Visits 16    Date for PT Re-Evaluation 04/30/21    Authorization Type MVA: BCBS    PT Start Time 1150    PT Stop Time 1228    PT Time Calculation (min) 38 min    Activity Tolerance Patient tolerated treatment well    Behavior During Therapy [facility] for tasks assessed/performed             Past Medical History:  Diagnosis Date   Cancer ([place])    endometreal ut   Diabetes mellitus    GERD (gastroesophageal reflux disease)    Hypercholesterolemia    Hypertension    Ileus ([place])    Neuropathy of both feet    Plantar fasciitis    Left foot    Past Surgical History:  Procedure Laterality Date   HAND LIGAMENT RECONSTRUCTION     ROBOTIC ASSISTED TOTAL HYSTERECTOMY WITH BILATERAL SALPINGO OOPHERECTOMY N/A 06/20/2020   Procedure: XI ROBOTIC ASSISTED TOTAL HYSTERECTOMY WITH BILATERAL SALPINGO OOPHORECTOMY, RIGHT URETERAL LYSIS;  Surgeon: [name], MD;  Location: [facility];  Service: Gynecology;  Laterality: N/A;   SENTINEL NODE BIOPSY N/A 06/20/2020   Procedure: SENTINEL NODE BIOPSY;  Surgeon: [name], MD;  Location: [facility];  Service: Gynecology;  Laterality: N/A;    There were no vitals filed for this visit.   Subjective Assessment - 04/08/21 1152     Subjective pain in Lt buttock. stretches give a little relief for a bit.    Currently in Pain? Yes    Pain Score 7     Pain Location Buttocks    Pain Orientation Left    Pain Descriptors / Indicators Aching               Entered pool via stairs with bil HR, depth up to 4', temp.96 deg  AquaticREHABdocumentation: Hydrostatic  pressure also supports joints by unweighting joint load by at least 50 % in 3-4 feet depth water. 80% in chest to neck deep water. and Water will allow for reduced gait deviation due to reduced joint loading through buoyancy to help patient improve posture without excess stress and pain   walking with noodle- fwd, lateral, retro long leg circles- holding side of pool alt knee flexion with focus on hips weight shifting Straight arm press downs with water dumbbells& GHJ flx/ex without dumbbells for core engagement Pull off of handrails: to chair, L stretch Gastroc stretch edge of step 3x30s Gentle trunk rotation- hands on water dumbbells                     PT Short Term Goals - 03/26/21 1538       PT SHORT TERM GOAL #1   Title Patient will improve pain to 5/10 in low back when standing.    Baseline 8/10    Time 3    Period Weeks    Status New    Target Date 04/16/21      PT SHORT TERM GOAL #2   Title Patient will increase strength of right anf left hip flexion to 4-/5.    Baseline  3/5 left hip, 3+/5    Time 3    Period Weeks    Status New    Target Date 04/16/21      PT SHORT TERM GOAL #3   Title Patient will show increase lumbar range of motion to 50% of normal limits.    Baseline 30% within functional limits for lumbar rotation in both right and left.    Time 3    Period Weeks    Status New    Target Date 04/16/21      PT SHORT TERM GOAL #4   Title Patient will have understanding of inital HEP in order to complete at home.    Baseline Verbal cueing and tactile cueing for initial HEP    Time 3    Period Weeks    Status New    Target Date 04/16/21               PT Long Term Goals - 03/26/21 1546       PT LONG TERM GOAL #1   Title Patient will report increase strength in bilateral hip strength to 4/5 in order to stand for more than 45 mins to cook a meal with less breaks.    Baseline 3+/5 right hip flexion, 3/5 left hip flexion, 4-/5 hip abd,  3+/5 hip add    Time 8    Period Weeks    Status New    Target Date 05/21/21      PT LONG TERM GOAL #2   Title Patient will improve berg balance test to reduce risk of falls with ambulation and transfer.    Time 8    Period Weeks    Status New    Target Date 05/21/21      PT LONG TERM GOAL #3   Title Patient will improve lumbar range of motion to 75% WFL in order to perform activites of daily living safely.    Baseline 30%WFL for right and left lumbar rotation.    Time 8    Period Weeks    Status New    Target Date 05/21/21      PT LONG TERM GOAL #4   Title Patient will show full understanding of final HEP and aquatic HEP in order to perform independently.    Baseline initial HEP    Time 8    Period Weeks    Status New    Target Date 05/21/21                   Plan - 04/08/21 1228     Clinical Impression Statement pt reported immediate decr in sensation of pain upon walking in pool stating she just does not feel the pain the same way as when she is out of the water. focused on general mobility to decr guarding against motion. Did feel dizzy after exiting the pool- reports her blood sugars have been over 600 for the last 3 days but has not called her MD yet- I encouraged her to do so.    PT Treatment/Interventions ADLs/Self Care Home Management;Gait training;Stair training;Functional mobility training;Therapeutic activities;Therapeutic exercise;Balance training;Neuromuscular re-education;Aquatic Therapy;Electrical Stimulation;Patient/family education;Manual techniques;Passive range of motion;Dry needling;Energy conservation;Joint Manipulations;Traction;Spinal Manipulations    PT Next Visit Plan aquatics    PT Home Exercise Plan Hesch method - self correction for Lt post inom, standing glute set every 20 mins, [id number]    Consulted and Agree with Plan of Care Patient  Patient will benefit from skilled therapeutic intervention in order to improve the  following deficits and impairments:  Abnormal gait, Decreased coordination, Decreased range of motion, Difficulty walking, Pain, Decreased activity tolerance, Decreased balance, Decreased endurance, Decreased strength, Postural dysfunction, Improper body mechanics, Increased fascial restricitons, Increased muscle spasms, Impaired perceived functional ability  Visit Diagnosis: Bilateral low back pain without sciatica, unspecified chronicity  Pain in left hip  Other abnormalities of gait and mobility     Problem List Patient Active Problem List   Diagnosis Date Noted   Bacteremia 06/28/2020   Hypothyroidism    Gastroesophageal reflux disease    Neuropathy    Pelvic fluid collection    Type 2 diabetes mellitus with hyperlipidemia (HCC)    SIRS due to infectious process with acute organ dysfunction ([place]) 06/24/2020   SIRS (systemic inflammatory response syndrome) ([place]) 06/24/2020   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 Katrece Roediger PT, DPT 04/08/21 12:33 PM  [facility] [place], [place], [address] Phone: [phone]   Fax:  [phone]  Name: Christy Carpenter MRN: [id number] Date of Birth: [birth year]

## 2021-04-10 ENCOUNTER — Ambulatory Visit (HOSPITAL_BASED_OUTPATIENT_CLINIC_OR_DEPARTMENT_OTHER): Payer: BC Managed Care – PPO | Admitting: Physical Therapy

## 2021-04-15 ENCOUNTER — Ambulatory Visit (HOSPITAL_BASED_OUTPATIENT_CLINIC_OR_DEPARTMENT_OTHER): Payer: BC Managed Care – PPO | Admitting: Physical Therapy

## 2021-04-15 ENCOUNTER — Other Ambulatory Visit: Payer: Self-pay

## 2021-04-15 ENCOUNTER — Encounter (HOSPITAL_BASED_OUTPATIENT_CLINIC_OR_DEPARTMENT_OTHER): Payer: Self-pay | Admitting: Physical Therapy

## 2021-04-15 DIAGNOSIS — M545 Low back pain, unspecified: Secondary | ICD-10-CM

## 2021-04-15 DIAGNOSIS — R2689 Other abnormalities of gait and mobility: Secondary | ICD-10-CM

## 2021-04-15 DIAGNOSIS — M25552 Pain in left hip: Secondary | ICD-10-CM

## 2021-04-15 NOTE — Therapy (Signed)
[facility] [address] [place], [place], [address] Phone: [phone]   Fax:  [phone]  Physical Therapy Treatment  Patient Details  Name: Christy Carpenter MRN: [id number] Date of Birth: Jun 28, 1956 Referring Provider (PT): Nelson Chimes PA-C   Encounter Date: 04/15/2021   PT End of Session - 04/15/21 1148     Visit Number 4    Number of Visits 16    Date for PT Re-Evaluation 04/30/21    Authorization Type MVA: BCBS    PT Start Time 1148    PT Stop Time 1228    PT Time Calculation (min) 40 min    Activity Tolerance Patient tolerated treatment well;Patient limited by pain    Behavior During Therapy [facility] for tasks assessed/performed             Past Medical History:  Diagnosis Date   Cancer ([place])    endometreal ut   Diabetes mellitus    GERD (gastroesophageal reflux disease)    Hypercholesterolemia    Hypertension    Ileus ([place])    Neuropathy of both feet    Plantar fasciitis    Left foot    Past Surgical History:  Procedure Laterality Date   HAND LIGAMENT RECONSTRUCTION     ROBOTIC ASSISTED TOTAL HYSTERECTOMY WITH BILATERAL SALPINGO OOPHERECTOMY N/A 06/20/2020   Procedure: XI ROBOTIC ASSISTED TOTAL HYSTERECTOMY WITH BILATERAL SALPINGO OOPHORECTOMY, RIGHT URETERAL LYSIS;  Surgeon: [name], MD;  Location: [facility];  Service: Gynecology;  Laterality: N/A;   SENTINEL NODE BIOPSY N/A 06/20/2020   Procedure: SENTINEL NODE BIOPSY;  Surgeon: [name], MD;  Location: [facility];  Service: Gynecology;  Laterality: N/A;    There were no vitals filed for this visit.   Subjective Assessment - 04/15/21 1155     Subjective The knee is not so bad but the hip joint (pointing in groin) hurts when I pull it up and go out to the side. Had to sit on the bench outside when walking from the parking lot.    Currently in Pain? Yes    Pain Score 4    in pool   Pain Location Hip    Pain Orientation Left    Aggravating Factors   walking, hip flexion, hip abd             Pt entered pool via stairs using Rt hand rail, depth up to 4', tep 90-94 deg.     AquaticREHABdocumentation: Water will allow for reduced gait deviation due to reduced joint loading through buoyancy to help patient improve posture without excess stress and pain. , Hydrostatic pressure also supports joints by unweighting joint load by at least 50 % in 3-4 feet depth water. 80% in chest to neck deep water., and Water will allow for reduced gait deviation due to reduced joint loading through buoyancy to help patient improve posture without excess stress and pain     walking with water bar- fwd, lateral, retro float HS stretch to glut set to press down alt knee flexion with focus on hips weight shifting Pull off of handrails: to chair, L stretch Seated pifiormis stretch Seated marching & clams                    PT Short Term Goals - 03/26/21 1538       PT SHORT TERM GOAL #1   Title Patient will improve pain to 5/10 in low back when standing.    Baseline 8/10  Time 3    Period Weeks    Status New    Target Date 04/16/21      PT SHORT TERM GOAL #2   Title Patient will increase strength of right anf left hip flexion to 4-/5.    Baseline 3/5 left hip, 3+/5    Time 3    Period Weeks    Status New    Target Date 04/16/21      PT SHORT TERM GOAL #3   Title Patient will show increase lumbar range of motion to 50% of normal limits.    Baseline 30% within functional limits for lumbar rotation in both right and left.    Time 3    Period Weeks    Status New    Target Date 04/16/21      PT SHORT TERM GOAL #4   Title Patient will have understanding of inital HEP in order to complete at home.    Baseline Verbal cueing and tactile cueing for initial HEP    Time 3    Period Weeks    Status New    Target Date 04/16/21               PT Long Term Goals - 03/26/21 1546       PT LONG TERM GOAL #1   Title Patient  will report increase strength in bilateral hip strength to 4/5 in order to stand for more than 45 mins to cook a meal with less breaks.    Baseline 3+/5 right hip flexion, 3/5 left hip flexion, 4-/5 hip abd, 3+/5 hip add    Time 8    Period Weeks    Status New    Target Date 05/21/21      PT LONG TERM GOAL #2   Title Patient will improve berg balance test to reduce risk of falls with ambulation and transfer.    Time 8    Period Weeks    Status New    Target Date 05/21/21      PT LONG TERM GOAL #3   Title Patient will improve lumbar range of motion to 75% WFL in order to perform activites of daily living safely.    Baseline 30%WFL for right and left lumbar rotation.    Time 8    Period Weeks    Status New    Target Date 05/21/21      PT LONG TERM GOAL #4   Title Patient will show full understanding of final HEP and aquatic HEP in order to perform independently.    Baseline initial HEP    Time 8    Period Weeks    Status New    Target Date 05/21/21                   Plan - 04/15/21 1230     Clinical Impression Statement possible DN at next visit but pt wants to think about it- will call to change her next appointment to [place] if she chooses to do so. Limited hip ER for piriformis stretch reporting significant pulling on post hip. Is seeing Dr Nelva Bush soon but no injection due to blood sugars. Has hydrocodone but denies taking due to lack of change in pain levels with it.    PT Treatment/Interventions ADLs/Self Care Home Management;Gait training;Stair training;Functional mobility training;Therapeutic activities;Therapeutic exercise;Balance training;Neuromuscular re-education;Aquatic Therapy;Electrical Stimulation;Patient/family education;Manual techniques;Passive range of motion;Dry needling;Energy conservation;Joint Manipulations;Traction;Spinal Manipulations    PT Next Visit Plan DN if she wants to  or con gentle mobility in water, review STGs    PT Home Exercise Plan Hesch  method - self correction for Lt post inom, standing glute set every 20 mins, [id number]    Consulted and Agree with Plan of Care Patient             Patient will benefit from skilled therapeutic intervention in order to improve the following deficits and impairments:  Abnormal gait, Decreased coordination, Decreased range of motion, Difficulty walking, Pain, Decreased activity tolerance, Decreased balance, Decreased endurance, Decreased strength, Postural dysfunction, Improper body mechanics, Increased fascial restricitons, Increased muscle spasms, Impaired perceived functional ability  Visit Diagnosis: Bilateral low back pain without sciatica, unspecified chronicity  Pain in left hip  Other abnormalities of gait and mobility     Problem List Patient Active Problem List   Diagnosis Date Noted   Bacteremia 06/28/2020   Hypothyroidism    Gastroesophageal reflux disease    Neuropathy    Pelvic fluid collection    Type 2 diabetes mellitus with hyperlipidemia (HCC)    SIRS due to infectious process with acute organ dysfunction ([place]) 06/24/2020   SIRS (systemic inflammatory response syndrome) ([place]) 06/24/2020   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) 02/23/2020   Endometrial cancer ([place]) 02/23/2020   Obesity (BMI 30.0-34.9) 02/23/2020   Chest pain 08/19/2017   Uncontrolled type 2 diabetes mellitus with hyperglycemia ([place]) 08/19/2017   Essential hypertension 08/19/2017   Hypercholesterolemia    Cephas Revard C. Siah Steely PT, DPT 04/15/21 12:55 PM   [facility] [place], [place], [address] Phone: [phone]   Fax:  [phone]  Name: Christy Carpenter MRN: [id number] Date of Birth: [birth year]

## 2021-04-17 ENCOUNTER — Encounter (HOSPITAL_BASED_OUTPATIENT_CLINIC_OR_DEPARTMENT_OTHER): Payer: Self-pay | Admitting: Physical Therapy

## 2021-04-17 ENCOUNTER — Other Ambulatory Visit: Payer: Self-pay

## 2021-04-17 ENCOUNTER — Ambulatory Visit (HOSPITAL_BASED_OUTPATIENT_CLINIC_OR_DEPARTMENT_OTHER): Payer: BC Managed Care – PPO | Attending: Orthopedic Surgery | Admitting: Physical Therapy

## 2021-04-17 DIAGNOSIS — R296 Repeated falls: Secondary | ICD-10-CM | POA: Insufficient documentation

## 2021-04-17 DIAGNOSIS — R2689 Other abnormalities of gait and mobility: Secondary | ICD-10-CM | POA: Insufficient documentation

## 2021-04-17 DIAGNOSIS — M545 Low back pain, unspecified: Secondary | ICD-10-CM | POA: Insufficient documentation

## 2021-04-17 DIAGNOSIS — M25552 Pain in left hip: Secondary | ICD-10-CM | POA: Diagnosis present

## 2021-04-17 NOTE — Therapy (Signed)
[facility] [address] [place], [place], [address] Phone: [phone]   Fax:  [phone]  Physical Therapy Treatment  Patient Details  Name: Christy Carpenter MRN: [id number] Date of Birth: 07-Jan-1956 Referring Provider (PT): Nelson Chimes PA-C   Encounter Date: 04/17/2021   PT End of Session - 04/17/21 1116     Visit Number 5    Number of Visits 16    Date for PT Re-Evaluation 04/30/21    Authorization Type MVA: BCBS    PT Start Time 1118    PT Stop Time 1200    PT Time Calculation (min) 42 min    Activity Tolerance Patient tolerated treatment well;Patient limited by pain    Behavior During Therapy [facility] for tasks assessed/performed             Past Medical History:  Diagnosis Date   Cancer ([place])    endometreal ut   Diabetes mellitus    GERD (gastroesophageal reflux disease)    Hypercholesterolemia    Hypertension    Ileus ([place])    Neuropathy of both feet    Plantar fasciitis    Left foot    Past Surgical History:  Procedure Laterality Date   HAND LIGAMENT RECONSTRUCTION     ROBOTIC ASSISTED TOTAL HYSTERECTOMY WITH BILATERAL SALPINGO OOPHERECTOMY N/A 06/20/2020   Procedure: XI ROBOTIC ASSISTED TOTAL HYSTERECTOMY WITH BILATERAL SALPINGO OOPHORECTOMY, RIGHT URETERAL LYSIS;  Surgeon: [name], MD;  [facility]: [facility];  Service: Gynecology;  Laterality: N/A;   SENTINEL NODE BIOPSY N/A 06/20/2020   Procedure: SENTINEL NODE BIOPSY;  Surgeon: [name], MD;  Location: [facility];  Service: Gynecology;  Laterality: N/A;    There were no vitals filed for this visit.   Subjective Assessment - 04/17/21 1347     Subjective "My hip is bad, haven't really gotten any relief throughout the therapy so far"                                         PT Short Term Goals - 03/26/21 1538       PT SHORT TERM GOAL #1   Title Patient will improve pain to 5/10 in low back when standing.    Baseline  8/10    Time 3    Period Weeks    Status New    Target Date 04/16/21      PT SHORT TERM GOAL #2   Title Patient will increase strength of right anf left hip flexion to 4-/5.    Baseline 3/5 left hip, 3+/5    Time 3    Period Weeks    Status New    Target Date 04/16/21      PT SHORT TERM GOAL #3   Title Patient will show increase lumbar range of motion to 50% of normal limits.    Baseline 30% within functional limits for lumbar rotation in both right and left.    Time 3    Period Weeks    Status New    Target Date 04/16/21      PT SHORT TERM GOAL #4   Title Patient will have understanding of inital HEP in order to complete at home.    Baseline Verbal cueing and tactile cueing for initial HEP    Time 3    Period Weeks    Status New    Target Date 04/16/21  PT Long Term Goals - 03/26/21 1546       PT LONG TERM GOAL #1   Title Patient will report increase strength in bilateral hip strength to 4/5 in order to stand for more than 45 mins to cook a meal with less breaks.    Baseline 3+/5 right hip flexion, 3/5 left hip flexion, 4-/5 hip abd, 3+/5 hip add    Time 8    Period Weeks    Status New    Target Date 05/21/21      PT LONG TERM GOAL #2   Title Patient will improve berg balance test to reduce risk of falls with ambulation and transfer.    Time 8    Period Weeks    Status New    Target Date 05/21/21      PT LONG TERM GOAL #3   Title Patient will improve lumbar range of motion to 75% WFL in order to perform activites of daily living safely.    Baseline 30%WFL for right and left lumbar rotation.    Time 8    Period Weeks    Status New    Target Date 05/21/21      PT LONG TERM GOAL #4   Title Patient will show full understanding of final HEP and aquatic HEP in order to perform independently.    Baseline initial HEP    Time 8    Period Weeks    Status New    Target Date 05/21/21           Pt seen for aquatic therapy today.  Treatment  took place in water 3.25-4.8 ft in depth at the [facility] pool. Temp of water was 91.  Pt entered/exited the pool via stairs step to pattern independently with bilat rail.   Warm up: forward, backward and side stepping/walking cues for increased step length, increased speed, hand placement to increase resistance X 8 widths   Seated Stretching: gastroc, hamstrings, hip ER's  Strengthening and ROM exercises; Flutter kick 3 x 25 seconds, add/abd 2 x 25 seconds -hip hinges pushing kick board x 12 - core rotation pushing kick board R/L x 10 -marching 2 x 10 -sit to stand x 10 VC, TC and demonstration for proper execution  Standing Strengthening;-squats 2 x 10  -Kick baord push downs 2 x 10 -Kick board rotation gentle 50% of range x 10 R/L  Water walking forward lunges x 6 widths holding to noodle  Pt requires buoyancy for support and to offload joints with strengthening exercises. Viscosity of the water is needed for resistance of strengthening; water current perturbations provides challenge to standing balance unsupported, requiring increased core activation.        Plan - 04/17/21 1349     Clinical Impression Statement Pt encouraged to have DN.  No change in condition.  Attempted hip ER stretching lle but not tolerated. Pt appears half asleep throughout session. Toleration to activity fair. Focused on gentle mobility seated and standing, gentle core strengthening    PT Treatment/Interventions ADLs/Self Care Home Management;Gait training;Stair training;Functional mobility training;Therapeutic activities;Therapeutic exercise;Balance training;Neuromuscular re-education;Aquatic Therapy;Electrical Stimulation;Patient/family education;Manual techniques;Passive range of motion;Dry needling;Energy conservation;Joint Manipulations;Traction;Spinal Manipulations    PT Home Exercise Plan Hesch method - self correction for Lt post inom, standing glute set every 20 mins, [id number]              Patient will benefit from skilled therapeutic intervention in order to improve the following deficits and impairments:  Abnormal gait, Decreased coordination,  Decreased range of motion, Difficulty walking, Pain, Decreased activity tolerance, Decreased balance, Decreased endurance, Decreased strength, Postural dysfunction, Improper body mechanics, Increased fascial restricitons, Increased muscle spasms, Impaired perceived functional ability  Visit Diagnosis: Bilateral low back pain without sciatica, unspecified chronicity  Pain in left hip  Other abnormalities of gait and mobility  Repeated falls     Problem List Patient Active Problem List   Diagnosis Date Noted   Bacteremia 06/28/2020   Hypothyroidism    Gastroesophageal reflux disease    Neuropathy    Pelvic fluid collection    Type 2 diabetes mellitus with hyperlipidemia (HCC)    SIRS due to infectious process with acute organ dysfunction ([place]) 06/24/2020   SIRS (systemic inflammatory response syndrome) ([place]) 06/24/2020   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

## 2021-04-22 ENCOUNTER — Encounter (HOSPITAL_BASED_OUTPATIENT_CLINIC_OR_DEPARTMENT_OTHER): Payer: Self-pay | Admitting: Physical Therapy

## 2021-04-22 ENCOUNTER — Other Ambulatory Visit: Payer: Self-pay

## 2021-04-22 ENCOUNTER — Ambulatory Visit (HOSPITAL_BASED_OUTPATIENT_CLINIC_OR_DEPARTMENT_OTHER): Payer: BC Managed Care – PPO | Admitting: Physical Therapy

## 2021-04-22 DIAGNOSIS — M545 Low back pain, unspecified: Secondary | ICD-10-CM

## 2021-04-22 DIAGNOSIS — R2689 Other abnormalities of gait and mobility: Secondary | ICD-10-CM

## 2021-04-22 DIAGNOSIS — R296 Repeated falls: Secondary | ICD-10-CM

## 2021-04-22 DIAGNOSIS — M25552 Pain in left hip: Secondary | ICD-10-CM

## 2021-04-22 NOTE — Therapy (Signed)
[facility] [address] [place], [place], [address] Phone: [phone]   Fax:  [phone]  Physical Therapy Treatment  Patient Details  Name: Christy Carpenter MRN: [id number] Date of Birth: August 09, 1956 Referring Provider (PT): Nelson Chimes PA-C   Encounter Date: 04/22/2021   PT End of Session - 04/22/21 1125     Visit Number 6    Number of Visits 16    Date for PT Re-Evaluation 04/30/21    PT Start Time [id number]    PT Stop Time 1210    PT Time Calculation (min) 44 min    Equipment Utilized During Treatment Other (comment)   Ankle cuff and waist buoys, kick board, noodle/sqoodle, nekdoodle, weights and barbells, water walker   Activity Tolerance Patient tolerated treatment well;Patient limited by pain    Behavior During Therapy WFL for tasks assessed/performed             Past Medical History:  Diagnosis Date   Cancer ([place])    endometreal ut   Diabetes mellitus    GERD (gastroesophageal reflux disease)    Hypercholesterolemia    Hypertension    Ileus ([place])    Neuropathy of both feet    Plantar fasciitis    Left foot    Past Surgical History:  Procedure Laterality Date   HAND LIGAMENT RECONSTRUCTION     ROBOTIC ASSISTED TOTAL HYSTERECTOMY WITH BILATERAL SALPINGO OOPHERECTOMY N/A 06/20/2020   Procedure: XI ROBOTIC ASSISTED TOTAL HYSTERECTOMY WITH BILATERAL SALPINGO OOPHORECTOMY, RIGHT URETERAL LYSIS;  Surgeon: [name], MD;  Location: [facility];  Service: Gynecology;  Laterality: N/A;   SENTINEL NODE BIOPSY N/A 06/20/2020   Procedure: SENTINEL NODE BIOPSY;  Surgeon: [name], MD;  Location: [facility];  Service: Gynecology;  Laterality: N/A;    There were no vitals filed for this visit.   Subjective Assessment - 04/22/21 1126     Subjective "Im okay for a short walk then hip and LB kick in together. I felt better until I got out to the car after lasy aquatic session"    Patient is accompained by: Family member     Currently in Pain? Yes    Pain Score 5     Pain Location Back    Pain Orientation Left;Lower    Pain Descriptors / Indicators Aching    Pain Onset More than a month ago    Pain Frequency Constant    Pain Score 5    Pain Orientation Left    Pain Descriptors / Indicators Aching    Pain Type Acute pain    Pain Onset More than a month ago    Pain Frequency Constant                                         PT Short Term Goals - 03/26/21 1538       PT SHORT TERM GOAL #1   Title Patient will improve pain to 5/10 in low back when standing.    Baseline 8/10    Time 3    Period Weeks    Status New    Target Date 04/16/21      PT SHORT TERM GOAL #2   Title Patient will increase strength of right anf left hip flexion to 4-/5.    Baseline 3/5 left hip, 3+/5    Time 3    Period Weeks  Status New    Target Date 04/16/21      PT SHORT TERM GOAL #3   Title Patient will show increase lumbar range of motion to 50% of normal limits.    Baseline 30% within functional limits for lumbar rotation in both right and left.    Time 3    Period Weeks    Status New    Target Date 04/16/21      PT SHORT TERM GOAL #4   Title Patient will have understanding of inital HEP in order to complete at home.    Baseline Verbal cueing and tactile cueing for initial HEP    Time 3    Period Weeks    Status New    Target Date 04/16/21               PT Long Term Goals - 03/26/21 1546       PT LONG TERM GOAL #1   Title Patient will report increase strength in bilateral hip strength to 4/5 in order to stand for more than 45 mins to cook a meal with less breaks.    Baseline 3+/5 right hip flexion, 3/5 left hip flexion, 4-/5 hip abd, 3+/5 hip add    Time 8    Period Weeks    Status New    Target Date 05/21/21      PT LONG TERM GOAL #2   Title Patient will improve berg balance test to reduce risk of falls with ambulation and transfer.    Time 8    Period Weeks     Status New    Target Date 05/21/21      PT LONG TERM GOAL #3   Title Patient will improve lumbar range of motion to 75% WFL in order to perform activites of daily living safely.    Baseline 30%WFL for right and left lumbar rotation.    Time 8    Period Weeks    Status New    Target Date 05/21/21      PT LONG TERM GOAL #4   Title Patient will show full understanding of final HEP and aquatic HEP in order to perform independently.    Baseline initial HEP    Time 8    Period Weeks    Status New    Target Date 05/21/21            Pt seen for aquatic therapy today.  Treatment took place in water 3.25-4.8 ft in depth at the [facility] pool. Temp of water was 91.  Pt entered/exited the pool via stairs step to pattern independently with bilat rail.     Warm up: forward, backward and side stepping/walking cues for increased step length, increased speed, hand placement to increase resistance X 8 widths     Seated Stretching: gastroc, hamstrings, hip ER's 3 x 20 sec hold Strengthening and ROM exercises; Flutter kick 2 x 25 seconds at hip, 25 seconds at knee -hip hinges pushing kick board 2x 10 - gentle core rotation pushing kick board R/L x 10 on surfaces of water  Decreased toleration to right (rotation)     -marching 2 x 10 -sit to stand x 10 VC, TC and demonstration for proper execution   Standing Strengthening;-squats x 10  -Kick baord push downs 2 x 15 -Kick board rotation gentle 75% of range x 10 R/L.  Pt with discomfort with rotation over left hip.   Water walking forward lunges x 6 widths holding to  noodle   Pt requires buoyancy for support and to offload joints with strengthening exercises. Viscosity of the water is needed for resistance of strengthening; water current perturbations provides challenge to standing balance unsupported, requiring increased core activation.          Plan - 04/22/21 1313     Clinical Impression Statement Pt with slight  decrease of pain for short durations.  Left hip pain limiting pt tolerance to therapy.  She does have decreased discomfort in aquatic setting vs on land.  Next visit on land.  Uncertain if pt had benefitted from aquatic therapy as of yet. No other appoints scheduled    Examination-Activity Limitations Reach Overhead;Caring for Others;Sleep;Carry;Squat;Stairs;Dressing;Stand;Lift;Transfers    Stability/Clinical Decision Making Evolving/Moderate complexity    Clinical Decision Making Moderate    Rehab Potential Good    PT Frequency 2x / week    PT Duration 8 weeks    PT Treatment/Interventions ADLs/Self Care Home Management;Gait training;Stair training;Functional mobility training;Therapeutic activities;Therapeutic exercise;Balance training;Neuromuscular re-education;Aquatic Therapy;Electrical Stimulation;Patient/family education;Manual techniques;Passive range of motion;Dry needling;Energy conservation;Joint Manipulations;Traction;Spinal Manipulations    PT Next Visit Plan balance challenges.    PT Home Exercise Plan [id number]    Consulted and Agree with Plan of Care Patient             Patient will benefit from skilled therapeutic intervention in order to improve the following deficits and impairments:  Abnormal gait, Decreased coordination, Decreased range of motion, Difficulty walking, Pain, Decreased activity tolerance, Decreased balance, Decreased endurance, Decreased strength, Postural dysfunction, Improper body mechanics, Increased fascial restricitons, Increased muscle spasms, Impaired perceived functional ability  Visit Diagnosis: Bilateral low back pain without sciatica, unspecified chronicity  Repeated falls  Pain in left hip  Other abnormalities of gait and mobility     Problem List Patient Active Problem List   Diagnosis Date Noted   Bacteremia 06/28/2020   Hypothyroidism    Gastroesophageal reflux disease    Neuropathy    Pelvic fluid collection    Type 2 diabetes  mellitus with hyperlipidemia (H[facility])    SIRS due to infectious process with acute organ dysfunction ([place]) 06/24/2020   SIRS (systemic inflammatory response syndrome) ([place]) 06/24/2020   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

## 2021-04-24 ENCOUNTER — Encounter (HOSPITAL_BASED_OUTPATIENT_CLINIC_OR_DEPARTMENT_OTHER): Payer: Self-pay | Admitting: Physical Therapy

## 2021-04-24 ENCOUNTER — Ambulatory Visit (HOSPITAL_BASED_OUTPATIENT_CLINIC_OR_DEPARTMENT_OTHER): Payer: BC Managed Care – PPO | Admitting: Physical Therapy

## 2021-04-24 ENCOUNTER — Other Ambulatory Visit: Payer: Self-pay

## 2021-04-24 DIAGNOSIS — R2689 Other abnormalities of gait and mobility: Secondary | ICD-10-CM

## 2021-04-24 DIAGNOSIS — R296 Repeated falls: Secondary | ICD-10-CM

## 2021-04-24 DIAGNOSIS — M545 Low back pain, unspecified: Secondary | ICD-10-CM | POA: Diagnosis not present

## 2021-04-24 DIAGNOSIS — M25552 Pain in left hip: Secondary | ICD-10-CM
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
## 2021-05-07 ENCOUNTER — Encounter (HOSPITAL_BASED_OUTPATIENT_CLINIC_OR_DEPARTMENT_OTHER): Payer: Self-pay | Admitting: Physical Therapy

## 2021-05-07 ENCOUNTER — Ambulatory Visit (HOSPITAL_BASED_OUTPATIENT_CLINIC_OR_DEPARTMENT_OTHER): Payer: BC Managed Care – PPO | Admitting: Physical Therapy

## 2021-05-07 ENCOUNTER — Other Ambulatory Visit: Payer: Self-pay

## 2021-05-07 DIAGNOSIS — M545 Low back pain, unspecified: Secondary | ICD-10-CM | POA: Diagnosis not present

## 2021-05-07 DIAGNOSIS — M5416 Radiculopathy, lumbar region: Secondary | ICD-10-CM | POA: Insufficient documentation

## 2021-05-07 DIAGNOSIS — M25552 Pain in left hip: Secondary | ICD-10-CM

## 2021-05-07 DIAGNOSIS — R296 Repeated falls: Secondary | ICD-10-CM

## 2021-05-07 NOTE — Therapy (Signed)
[facility] [address] [place], [place], [address] Phone: [phone]   Fax:  [phone]  Physical Therapy Treatment  Patient Details  Name: Christy Carpenter MRN: [id number] Date of Birth: 10/30/1955 Referring Provider (PT): Amanda Rivera PA-C   Encounter Date: 05/07/2021   PT End of Session - 05/07/21 0932     Visit Number 8    Number of Visits 16    Date for PT Re-Evaluation 05/23/21    Authorization Type MVA: BCBS    PT Start Time 0847    PT Stop Time 0930    PT Time Calculation (min) 43 min    Activity Tolerance Patient limited by pain    Behavior During Therapy WFL for tasks assessed/performed             Past Medical History:  Diagnosis Date   Cancer (HCC)    endometreal ut   Diabetes mellitus    GERD (gastroesophageal reflux disease)    Hypercholesterolemia    Hypertension    Ileus (HCC)    Neuropathy of both feet    Plantar fasciitis    Left foot    Past Surgical History:  Procedure Laterality Date   HAND LIGAMENT RECONSTRUCTION     ROBOTIC ASSISTED TOTAL HYSTERECTOMY WITH BILATERAL SALPINGO OOPHERECTOMY N/A 06/20/2020   Procedure: XI ROBOTIC ASSISTED TOTAL HYSTERECTOMY WITH BILATERAL SALPINGO OOPHORECTOMY, RIGHT URETERAL LYSIS;  Surgeon: Rossi, Emma, MD;  Location: [facility];  Service: Gynecology;  Laterality: N/A;   SENTINEL NODE BIOPSY N/A 06/20/2020   Procedure: SENTINEL NODE BIOPSY;  Surgeon: Rossi, Emma, MD;  Location: [facility];  Service: Gynecology;  Laterality: N/A;    There were no vitals filed for this visit.   Subjective Assessment - 05/07/21 0933     Subjective We had a thing for my brother this weekend and I had a hard time functioning. I feel much better in the water. The pain is in my left buttocks and I can feel pain to my knee, sometimes spreads across the lower back. They don't want to do an injection bc of my sugars but they said they may be able to go in and clean it up.    Patient  Stated Goals Decrease pain in order to walk more and cook without pain. ironing, dishes    Currently in Pain? Yes    Pain Score 8     Pain Location Buttocks    Pain Orientation Left    Pain Descriptors / Indicators Aching;Tightness    Pain Radiating Towards to knee- not along neural pathway    Aggravating Factors  standing/walking    Pain Relieving Factors sit                [facility] PT Assessment - 05/07/21 0001       Posture/Postural Control   Posture Comments obtains a fully upright posture instanding      Ambulation/Gait   Gait Comments demo upright posture with trunk rotation and arm swing              Pt entered pool with use of unilateral hand rail and step-to pattern led with Rt LE. Depth up to 4' and temp 93 deg.    AquaticREHABdocumentation: Pt is unable to tolerate land due to pain or inability to move freely on land without pain and substitution/compensations , Water will allow for reduced gait deviation due to reduced joint loading through buoyancy to help patient improve posture without excess stress and pain. ,   Pt requires the buoyancy of water for active assisted exercises with buoyancy supported for strengthening & ROM exercises, Hydrostatic pressure also supports joints by unweighting joint load by at least 50 % in 3-4 feet depth water. 80% in chest to neck deep water., and Water will allow for reduced gait deviation due to reduced joint loading through buoyancy to help patient improve posture without excess stress and pain  Walking with alt UE reaches Lateral lunges Attempted figure 4 position in seated- too painful in hip joint.  5lb on Lt ankle- traction while sitting on 2 noodles- length toward floor and extension walking 5lb on Lt ankle in chest deep water STM with tennis ball on step                 PT Education - 05/07/21 0935     Education Details centralization of pain to "problem area" vs systemic spasm, discussed injection vs  surgery & MSK vs Neuro symptoms, exercise form/rationale    Person(s) Educated Patient    Methods Explanation;Demonstration;Verbal cues    Comprehension Verbalized understanding;Returned demonstration;Verbal cues required;Need further instruction              PT Short Term Goals - 04/24/21 1113       PT SHORT TERM GOAL #1   Title Patient will improve pain to 5/10 in low back when standing.    Baseline up to 10    Status On-going      PT SHORT TERM GOAL #2   Title Patient will increase strength of right anf left hip flexion to 4-/5.    Baseline bil 4/5    Status Achieved      PT SHORT TERM GOAL #3   Title Patient will show increase lumbar range of motion to 50% of normal limits.    Baseline flexion to mid-shin, Rt SB to knee joint line, Lt SB to 50% to knee joint line    Status Achieved      PT SHORT TERM GOAL #4   Title Patient will have understanding of inital HEP in order to complete at home.    Baseline independent with initial HEP    Status Achieved               PT Long Term Goals - 04/24/21 1301       PT LONG TERM GOAL #1   Title Patient will report increase strength in bilateral hip strength to 4/5 in order to stand for more than 45 mins to cook a meal with less breaks.    Baseline has 4/5 strength but unable to stand for 45 min    Status Partially Met    Target Date 05/23/21      PT LONG TERM GOAL #2   Title pt will demo at least 5s of steady SLS    Baseline unable today    Status Revised      PT LONG TERM GOAL #3   Title Patient will improve lumbar range of motion to 75% WFL in order to perform activites of daily living safely.    Baseline 50% today    Status On-going      PT LONG TERM GOAL #4   Title Patient will show full understanding of final HEP and aquatic HEP in order to perform independently.    Baseline requires further progression    Status On-going                   Plan - 05/07/21  0937     Clinical Impression Statement  Pain cont to be consistent with Lt SIJ involvement and associated piriformis spasm placing pressure into femoral-acetabular joint. Pt has made objective improvements in her posture, ROM and strength since beginning PT and her pain is centralizing to Lt buttock region but she continues to complain of severe pain levels hovering around 8/10. I do think she would benefit from an injection or other pharmacutical assistance that may be available with blood sugars in mind. Utilized weight for femoral-acetabular distraction while sitting on noodles as she did not want to lay down in the water which did feel helpful to the discomfort. Piriformis is very tight and she was instructed on how to use the tennis ball for self STM while seated on steps in pool.    PT Treatment/Interventions ADLs/Self Care Home Management;Gait training;Stair training;Functional mobility training;Therapeutic activities;Therapeutic exercise;Balance training;Neuromuscular re-education;Aquatic Therapy;Electrical Stimulation;Patient/family education;Manual techniques;Passive range of motion;Dry needling;Energy conservation;Joint Manipulations;Traction;Spinal Manipulations    PT Next Visit Plan cont core activation, hip distraction, how was visit with Dr Ramos? cont self STM    PT Home Exercise Plan [id number]    Consulted and Agree with Plan of Care Patient             Patient will benefit from skilled therapeutic intervention in order to improve the following deficits and impairments:  Abnormal gait, Decreased coordination, Decreased range of motion, Difficulty walking, Pain, Decreased activity tolerance, Decreased balance, Decreased endurance, Decreased strength, Postural dysfunction, Improper body mechanics, Increased fascial restricitons, Increased muscle spasms, Impaired perceived functional ability  Visit Diagnosis: Bilateral low back pain without sciatica, unspecified chronicity  Repeated falls  Pain in left hip     Problem  List Patient Active Problem List   Diagnosis Date Noted   Bacteremia 06/28/2020   Hypothyroidism    Gastroesophageal reflux disease    Neuropathy    Pelvic fluid collection    Type 2 diabetes mellitus with hyperlipidemia (HCC)    SIRS due to infectious process with acute organ dysfunction (HCC) 06/24/2020   SIRS (systemic inflammatory response syndrome) (HCC) 06/24/2020   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 Hightower PT, DPT 05/07/21 11:12 AM   [facility] [address] [place], [place], [address] Phone: [phone]   Fax:  [phone]  Name: Christy Carpenter MRN: [id number] Date of Birth: 12/02/1955    

## 2021-05-15 ENCOUNTER — Ambulatory Visit (HOSPITAL_BASED_OUTPATIENT_CLINIC_OR_DEPARTMENT_OTHER): Payer: BC Managed Care – PPO | Admitting: Physical Therapy

## 2021-05-15 ENCOUNTER — Other Ambulatory Visit: Payer: Self-pay

## 2021-05-15 ENCOUNTER — Encounter (HOSPITAL_BASED_OUTPATIENT_CLINIC_OR_DEPARTMENT_OTHER): Payer: Self-pay | Admitting: Physical Therapy

## 2021-05-15 DIAGNOSIS — M545 Low back pain, unspecified: Secondary | ICD-10-CM | POA: Diagnosis not present

## 2021-05-15 DIAGNOSIS — R2689 Other abnormalities of gait and mobility: Secondary | ICD-10-CM

## 2021-05-15 DIAGNOSIS — M25552 Pain in left hip: Secondary | ICD-10-CM

## 2021-05-15 DIAGNOSIS — R296 Repeated falls: Secondary | ICD-10-CM

## 2021-05-15 NOTE — Therapy (Signed)
[facility] [address] [place], [place], [address] Phone: [phone]   Fax:  [phone]  Physical Therapy Treatment  Patient Details  Name: Christy Carpenter MRN: [id number] Date of Birth: August 19, 1955 Referring Provider (PT): Nelson Chimes PA-C   Encounter Date: 05/15/2021   PT End of Session - 05/15/21 1310     Visit Number 9    Number of Visits 16    Date for PT Re-Evaluation 05/23/21    Authorization Type MVA: BCBS    PT Start Time 1300    PT Stop Time 9528    PT Time Calculation (min) 43 min    Activity Tolerance Patient tolerated treatment well    Behavior During Therapy [facility] for tasks assessed/performed             Past Medical History:  Diagnosis Date   Cancer ([place])    endometreal ut   Diabetes mellitus    GERD (gastroesophageal reflux disease)    Hypercholesterolemia    Hypertension    Ileus ([place])    Neuropathy of both feet    Plantar fasciitis    Left foot    Past Surgical History:  Procedure Laterality Date   HAND LIGAMENT RECONSTRUCTION     ROBOTIC ASSISTED TOTAL HYSTERECTOMY WITH BILATERAL SALPINGO OOPHERECTOMY N/A 06/20/2020   Procedure: XI ROBOTIC ASSISTED TOTAL HYSTERECTOMY WITH BILATERAL SALPINGO OOPHORECTOMY, RIGHT URETERAL LYSIS;  Surgeon: [name], MD;  Location: [facility];  Service: Gynecology;  Laterality: N/A;   SENTINEL NODE BIOPSY N/A 06/20/2020   Procedure: SENTINEL NODE BIOPSY;  Surgeon: [name], MD;  Location: [facility];  Service: Gynecology;  Laterality: N/A;    There were no vitals filed for this visit.   Subjective Assessment - 05/15/21 1312     Subjective It has been a rough couple of days. I need surgery but my A1C is too high.    Patient Stated Goals Decrease pain in order to walk more and cook without pain. ironing, dishes    Currently in Pain? Yes    Pain Score 6     Pain Location Buttocks    Pain Orientation Left    Pain Descriptors / Indicators Aching    Aggravating  Factors  walking    Pain Relieving Factors sit, take rest breaks              pt entered pool using handrails, depth up to 4' and temp 94 deg.    AquaticREHABdocumentation: Water will reduce pain due to thermal shifts, allowing for better position and tolerance of manual treatment., Water will allow for reduced gait deviation due to reduced joint loading through buoyancy to help patient improve posture without excess stress and pain. , Pt requires the buoyancy of water for active assisted exercises with buoyancy supported for strengthening & ROM exercises, Hydrostatic pressure also supports joints by unweighting joint load by at least 50 % in 3-4 feet depth water. 80% in chest to neck deep water., and Water will allow for reduced gait deviation due to reduced joint loading through buoyancy to help patient improve posture without excess stress and pain  Walking with water dumbbells at side for traction Hip abd off edge of step self trigger point release with tennis ball- hand rails for buoyancy control 3lb around Lt ankle- Lt hip march, seated resisted Lt hip flexion, lt LAQ-all to encourage ant rotation of Lt innom Hamstrings & hip flexor stretch lunge on steps Pull off stretch for lumbar spine  PT Short Term Goals - 04/24/21 1113       PT SHORT TERM GOAL #1   Title Patient will improve pain to 5/10 in low back when standing.    Baseline up to 10    Status On-going      PT SHORT TERM GOAL #2   Title Patient will increase strength of right anf left hip flexion to 4-/5.    Baseline bil 4/5    Status Achieved      PT SHORT TERM GOAL #3   Title Patient will show increase lumbar range of motion to 50% of normal limits.    Baseline flexion to mid-shin, Rt SB to knee joint line, Lt SB to 50% to knee joint line    Status Achieved      PT SHORT TERM GOAL #4   Title Patient will have understanding of inital HEP in order to complete at home.     Baseline independent with initial HEP    Status Achieved               PT Long Term Goals - 04/24/21 1301       PT LONG TERM GOAL #1   Title Patient will report increase strength in bilateral hip strength to 4/5 in order to stand for more than 45 mins to cook a meal with less breaks.    Baseline has 4/5 strength but unable to stand for 45 min    Status Partially Met    Target Date 05/23/21      PT LONG TERM GOAL #2   Title pt will demo at least 5s of steady SLS    Baseline unable today    Status Revised      PT LONG TERM GOAL #3   Title Patient will improve lumbar range of motion to 75% WFL in order to perform activites of daily living safely.    Baseline 50% today    Status On-going      PT LONG TERM GOAL #4   Title Patient will show full understanding of final HEP and aquatic HEP in order to perform independently.    Baseline requires further progression    Status On-going                   Plan - 05/15/21 1727     Clinical Impression Statement Activation of Lt hip flexors to encourage anterior rotation of Lt innom and pt was able to be out of the water on 3rd step and feel "fine"    Encouraged her to continue use of ball for self STM to Lt hip. Has one more appointment in the pool next week and then will do a land re-evaluation to determine necessary POC.    PT Treatment/Interventions ADLs/Self Care Home Management;Gait training;Stair training;Functional mobility training;Therapeutic activities;Therapeutic exercise;Balance training;Neuromuscular re-education;Aquatic Therapy;Electrical Stimulation;Patient/family education;Manual techniques;Passive range of motion;Dry needling;Energy conservation;Joint Manipulations;Traction;Spinal Manipulations    PT Next Visit Plan cont Lt hip flexor activation & self STM, core engagement- pt does not want to lay in water to get her hair wet    PT Home Exercise Plan [id number]    Consulted and Agree with Plan of Care Patient              Patient will benefit from skilled therapeutic intervention in order to improve the following deficits and impairments:  Abnormal gait, Decreased coordination, Decreased range of motion, Difficulty walking, Pain, Decreased activity tolerance, Decreased balance, Decreased endurance, Decreased strength, Postural dysfunction, Improper  body mechanics, Increased fascial restricitons, Increased muscle spasms, Impaired perceived functional ability  Visit Diagnosis: Bilateral low back pain without sciatica, unspecified chronicity  Repeated falls  Pain in left hip  Other abnormalities of gait and mobility     Problem List Patient Active Problem List   Diagnosis Date Noted   Bacteremia 06/28/2020   Hypothyroidism    Gastroesophageal reflux disease    Neuropathy    Pelvic fluid collection    Type 2 diabetes mellitus with hyperlipidemia ([place])    SIRS due to infectious process with acute organ dysfunction ([place]) 06/24/2020   SIRS (systemic inflammatory response syndrome) ([place]) 06/24/2020   Endometrial adenocarcinoma ([place]) 06/20/2020   Endometriosis    Retroperitoneal fibrosis    Diabetic neuropathy ([place]) 02/23/2020   Endometrial cancer ([place]) 02/23/2020   Obesity (BMI 30.0-34.9) 02/23/2020   Chest pain 08/19/2017   Uncontrolled type 2 diabetes mellitus with hyperglycemia ([place]) 08/19/2017   Essential hypertension 08/19/2017   Hypercholesterolemia    Janit Cutter C. Leisha Trinkle PT, DPT 05/15/21 5:30 PM  [facility] [place], [place], [address] Phone: [phone]   Fax:  [phone]  Name: Christy Carpenter MRN: [id number] Date of Birth: 1956-01-18

## 2021-05-19 ENCOUNTER — Ambulatory Visit (HOSPITAL_BASED_OUTPATIENT_CLINIC_OR_DEPARTMENT_OTHER): Payer: BC Managed Care – PPO | Attending: Orthopedic Surgery | Admitting: Physical Therapy

## 2021-05-19 ENCOUNTER — Telehealth (HOSPITAL_BASED_OUTPATIENT_CLINIC_OR_DEPARTMENT_OTHER): Payer: Self-pay | Admitting: Physical Therapy

## 2021-05-19 DIAGNOSIS — G8929 Other chronic pain: Secondary | ICD-10-CM | POA: Insufficient documentation

## 2021-05-19 DIAGNOSIS — R293 Abnormal posture: Secondary | ICD-10-CM | POA: Insufficient documentation

## 2021-05-19 DIAGNOSIS — R296 Repeated falls: Secondary | ICD-10-CM | POA: Insufficient documentation

## 2021-05-19 DIAGNOSIS — R262 Difficulty in walking, not elsewhere classified: Secondary | ICD-10-CM | POA: Insufficient documentation

## 2021-05-19 DIAGNOSIS — M25561 Pain in right knee: Secondary | ICD-10-CM | POA: Insufficient documentation

## 2021-05-19 DIAGNOSIS — R2689 Other abnormalities of gait and mobility: Secondary | ICD-10-CM | POA: Insufficient documentation

## 2021-05-19 DIAGNOSIS — M545 Low back pain, unspecified: Secondary | ICD-10-CM | POA: Insufficient documentation

## 2021-05-19 DIAGNOSIS — R2681 Unsteadiness on feet: Secondary | ICD-10-CM | POA: Insufficient documentation

## 2021-05-19 DIAGNOSIS — M25552 Pain in left hip: Secondary | ICD-10-CM | POA: Insufficient documentation

## 2021-05-19 DIAGNOSIS — M6281 Muscle weakness (generalized): Secondary | ICD-10-CM | POA: Insufficient documentation

## 2021-05-19 NOTE — Telephone Encounter (Signed)
Called and left message on pt cell phone informing her of her missed visit for today. Reminded her of appoint scheduled for Friday 05-23-21

## 2021-05-23 ENCOUNTER — Ambulatory Visit (HOSPITAL_BASED_OUTPATIENT_CLINIC_OR_DEPARTMENT_OTHER): Payer: BC Managed Care – PPO | Admitting: Physical Therapy

## 2021-05-23 ENCOUNTER — Encounter (HOSPITAL_BASED_OUTPATIENT_CLINIC_OR_DEPARTMENT_OTHER): Payer: Self-pay | Admitting: Physical Therapy

## 2021-05-23 ENCOUNTER — Other Ambulatory Visit: Payer: Self-pay

## 2021-05-23 DIAGNOSIS — G8929 Other chronic pain: Secondary | ICD-10-CM | POA: Diagnosis present

## 2021-05-23 DIAGNOSIS — M25552 Pain in left hip: Secondary | ICD-10-CM | POA: Diagnosis present

## 2021-05-23 DIAGNOSIS — M545 Low back pain, unspecified: Secondary | ICD-10-CM

## 2021-05-23 DIAGNOSIS — R296 Repeated falls: Secondary | ICD-10-CM

## 2021-05-23 DIAGNOSIS — M25561 Pain in right knee: Secondary | ICD-10-CM | POA: Diagnosis present

## 2021-05-23 DIAGNOSIS — M6281 Muscle weakness (generalized): Secondary | ICD-10-CM | POA: Diagnosis present

## 2021-05-23 DIAGNOSIS — R2689 Other abnormalities of gait and mobility: Secondary | ICD-10-CM

## 2021-05-23 DIAGNOSIS — R262 Difficulty in walking, not elsewhere classified: Secondary | ICD-10-CM | POA: Diagnosis present

## 2021-05-23 DIAGNOSIS — R293 Abnormal posture: Secondary | ICD-10-CM | POA: Diagnosis not present

## 2021-05-23 DIAGNOSIS — R2681 Unsteadiness on feet: Secondary | ICD-10-CM | POA: Diagnosis present

## 2021-05-23 NOTE — Therapy (Signed)
[facility] [address] [place], [place], [address] Phone: [phone]   Fax:  [phone]  Physical Therapy Treatment/Re-evaluation  Patient Details  Name: Christy Carpenter MRN: [id number] Date of Birth: 1955-11-26 Referring Provider (PT): Nelson Chimes PA-C   Encounter Date: 05/23/2021   PT End of Session - 05/23/21 1101     Visit Number 10    Number of Visits 22    Date for PT Re-Evaluation 07/04/21    Authorization Type MVA: BCBS    PT Start Time 1100    PT Stop Time 1143    PT Time Calculation (min) 43 min    Activity Tolerance Patient tolerated treatment well    Behavior During Therapy WFL for tasks assessed/performed             Past Medical History:  Diagnosis Date   Cancer ([place])    endometreal ut   Diabetes mellitus    GERD (gastroesophageal reflux disease)    Hypercholesterolemia    Hypertension    Ileus ([place])    Neuropathy of both feet    Plantar fasciitis    Left foot    Past Surgical History:  Procedure Laterality Date   HAND LIGAMENT RECONSTRUCTION     ROBOTIC ASSISTED TOTAL HYSTERECTOMY WITH BILATERAL SALPINGO OOPHERECTOMY N/A 06/20/2020   Procedure: XI ROBOTIC ASSISTED TOTAL HYSTERECTOMY WITH BILATERAL SALPINGO OOPHORECTOMY, RIGHT URETERAL LYSIS;  Surgeon: [name], MD;  Location: [facility];  Service: Gynecology;  Laterality: N/A;   SENTINEL NODE BIOPSY N/A 06/20/2020   Procedure: SENTINEL NODE BIOPSY;  Surgeon: [name], MD;  Location: [facility];  Service: Gynecology;  Laterality: N/A;    There were no vitals filed for this visit.   Subjective Assessment - 05/23/21 1101     Subjective It seems like the intensity is not quite as great but that could be because I am giving in to it. Coming off of the table or out of the pool- it does seem like it takes a little longer to get to the above 5 level. This has been a long week. Last week my Lt thigh and knee didn't hurt/ache, this week there is some  aching but not as bad as I remember it from before. I do think the day you caused my piriformis to move- I got a little more ease that day.    Patient Stated Goals Decrease pain in order to walk more and cook without pain. ironing, dishes    Currently in Pain? Yes    Pain Score 6     Pain Location Buttocks    Pain Orientation Left    Pain Descriptors / Indicators Aching    Aggravating Factors  standing, walking    Pain Relieving Factors sit & rest, being in the pool                [facility] PT Assessment - 05/23/21 0001       Assessment   Medical Diagnosis Bilateral Low Back Pain and Left Hip Pain    Referring Provider (PT) Nelson Chimes PA-C    Onset Date/Surgical Date 12/01/20    Hand Dominance Right      Precautions   Precautions None      Restrictions   Weight Bearing Restrictions No      Balance Screen   Has the patient fallen in the past 6 months No      Prior Function   Level of Independence Independent  Cognition   Overall Cognitive Status Within Functional Limits for tasks assessed      Sensation   Additional Comments occasional tingling in Lt fingers but not in LEs      Posture/Postural Control   Posture Comments obtains a fully upright posture in standing      AROM   Lumbar Flexion distal shin    Lumbar - Right Side Bend knee joint line    Lumbar - Left Side Bend knee joint line      Strength   Right Hip Flexion 4/5    Right Hip ABduction 4/5    Left Hip Flexion 5/5    Left Hip ABduction 5/5                                    PT Education - 05/23/21 1421     Education Details anatomy of condition, disucssion regarding MD suggestions & options for spinal stimulator or "cleaning up"   goals, POC    Person(s) Educated Patient    Methods Explanation    Comprehension Verbalized understanding;Need further instruction              PT Short Term Goals - 04/24/21 1113       PT SHORT TERM GOAL #1   Title Patient  will improve pain to 5/10 in low back when standing.    Baseline up to 10    Status On-going      PT SHORT TERM GOAL #2   Title Patient will increase strength of right anf left hip flexion to 4-/5.    Baseline bil 4/5    Status Achieved      PT SHORT TERM GOAL #3   Title Patient will show increase lumbar range of motion to 50% of normal limits.    Baseline flexion to mid-shin, Rt SB to knee joint line, Lt SB to 50% to knee joint line    Status Achieved      PT SHORT TERM GOAL #4   Title Patient will have understanding of inital HEP in order to complete at home.    Baseline independent with initial HEP    Status Achieved               PT Long Term Goals - 05/23/21 1118       PT LONG TERM GOAL #1   Title Patient will report increase strength in bilateral hip strength to 4/5 in order to stand for more than 45 mins to cook a meal with less breaks.    Baseline 4/5 strength, sometimes I can lean over on [place] to reduce pain and return to cooking    Status Partially Met      PT LONG TERM GOAL #2   Title pt will demo at least 5s of steady SLS    Baseline able to demo on Rt side, able to shift weight to the Lt but not able to tolerate full SLS    Status On-going      PT LONG TERM GOAL #3   Title Patient will improve lumbar range of motion to 75% WFL in order to perform activites of daily living safely.    Status Achieved      PT LONG TERM GOAL #4   Title Patient will show full understanding of final HEP and aquatic HEP in order to perform independently.    Baseline requires further progression  Status On-going                   Plan - 05/23/21 1423     Clinical Impression Statement Pt is showing progress in her fluidity & tolerance of motion as well as positional tolerance. Rt leg was able to obtain and SLS- poor balance but did accept and hold weight for 3s without complaints of Lt-sided pain. Speaks while performing bed mobility rather than holding her breath  due to pain. Demonstrating improved trunk ROM and ability to stand is increasing wiht ability to take rest breaks and return. Will extend POC to continue her progress and reach functional goals.    PT Frequency 2x / week    PT Duration 6 weeks    PT Treatment/Interventions ADLs/Self Care Home Management;Gait training;Stair training;Functional mobility training;Therapeutic activities;Therapeutic exercise;Balance training;Neuromuscular re-education;Aquatic Therapy;Electrical Stimulation;Patient/family education;Manual techniques;Passive range of motion;Dry needling;Energy conservation;Joint Manipulations;Traction;Spinal Manipulations    PT Next Visit Plan cont Lt hip flexor activation & self STM, core engagement- pt does not want to lay in water to get her hair wet    PT Home Exercise Plan [id number]    Consulted and Agree with Plan of Care Patient             Patient will benefit from skilled therapeutic intervention in order to improve the following deficits and impairments:  Abnormal gait, Decreased coordination, Decreased range of motion, Difficulty walking, Pain, Decreased activity tolerance, Decreased balance, Decreased endurance, Decreased strength, Postural dysfunction, Improper body mechanics, Increased fascial restricitons, Increased muscle spasms, Impaired perceived functional ability  Visit Diagnosis: Bilateral low back pain without sciatica, unspecified chronicity  Repeated falls  Pain in left hip  Other abnormalities of gait and mobility     Problem List Patient Active Problem List   Diagnosis Date Noted   Bacteremia 06/28/2020   Hypothyroidism    Gastroesophageal reflux disease    Neuropathy    Pelvic fluid collection    Type 2 diabetes mellitus with hyperlipidemia (HCC)    SIRS due to infectious process with acute organ dysfunction ([place]) 06/24/2020   SIRS (systemic inflammatory response syndrome) ([place]) 06/24/2020   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 Cloteal Isaacson PT, DPT 05/23/21 2:26 PM   [facility] [place], [place], [address] Phone: [phone]   Fax:  [phone]  Name: Christy Carpenter MRN: [id number] Date of Birth: Dec 20, 1955

## 2021-05-27 ENCOUNTER — Encounter (HOSPITAL_BASED_OUTPATIENT_CLINIC_OR_DEPARTMENT_OTHER): Payer: Self-pay | Admitting: Physical Therapy

## 2021-05-27 DIAGNOSIS — Z0289 Encounter for other administrative examinations: Secondary | ICD-10-CM

## 2021-06-02 ENCOUNTER — Encounter (HOSPITAL_BASED_OUTPATIENT_CLINIC_OR_DEPARTMENT_OTHER): Payer: Self-pay | Admitting: Physical Therapy

## 2021-06-02 ENCOUNTER — Ambulatory Visit (HOSPITAL_BASED_OUTPATIENT_CLINIC_OR_DEPARTMENT_OTHER): Payer: BC Managed Care – PPO | Admitting: Physical Therapy

## 2021-06-02 ENCOUNTER — Other Ambulatory Visit: Payer: Self-pay

## 2021-06-02 DIAGNOSIS — R293 Abnormal posture: Secondary | ICD-10-CM | POA: Diagnosis not present

## 2021-06-02 DIAGNOSIS — R296 Repeated falls: Secondary | ICD-10-CM

## 2021-06-02 DIAGNOSIS — M25552 Pain in left hip: Secondary | ICD-10-CM

## 2021-06-02 DIAGNOSIS — R2689 Other abnormalities of gait and mobility: Secondary | ICD-10-CM

## 2021-06-02 DIAGNOSIS — M545 Low back pain, unspecified: Secondary | ICD-10-CM

## 2021-06-02 NOTE — Therapy (Signed)
[facility] [address] [place], [place], [address] Phone: [phone]   Fax:  [phone]  Physical Therapy Treatment  Patient Details  Name: Christy Carpenter MRN: [id number] Date of Birth: 1956/06/20 Referring Provider (PT): Nelson Chimes PA-C   Encounter Date: 06/02/2021   PT End of Session - 06/02/21 1104     Visit Number 11    Number of Visits 22    Date for PT Re-Evaluation 07/04/21    Authorization Type MVA: BCBS    PT Start Time 1101    PT Stop Time 2229    PT Time Calculation (min) 44 min    Activity Tolerance Patient tolerated treatment well    Behavior During Therapy WFL for tasks assessed/performed             Past Medical History:  Diagnosis Date   Cancer ([place])    endometreal ut   Diabetes mellitus    GERD (gastroesophageal reflux disease)    Hypercholesterolemia    Hypertension    Ileus ([place])    Neuropathy of both feet    Plantar fasciitis    Left foot    Past Surgical History:  Procedure Laterality Date   HAND LIGAMENT RECONSTRUCTION     ROBOTIC ASSISTED TOTAL HYSTERECTOMY WITH BILATERAL SALPINGO OOPHERECTOMY N/A 06/20/2020   Procedure: XI ROBOTIC ASSISTED TOTAL HYSTERECTOMY WITH BILATERAL SALPINGO OOPHORECTOMY, RIGHT URETERAL LYSIS;  Surgeon: [name], MD;  [facility]: [facility];  Service: Gynecology;  Laterality: N/A;   SENTINEL NODE BIOPSY N/A 06/20/2020   Procedure: SENTINEL NODE BIOPSY;  Surgeon: [name], MD;  Location: [facility];  Service: Gynecology;  Laterality: N/A;    There were no vitals filed for this visit.   Subjective Assessment - 06/02/21 1105     Subjective Lt groin is really bothering me. I can lift my leg straight up and be fine but any rotatio is very painful. occasional popping but not so bad.    Patient Stated Goals Decrease pain in order to walk more and cook without pain. ironing, dishes                               [facility] Adult PT  Treatment/Exercise - 06/02/21 0001       Manual Therapy   Joint Mobilization Lt hip long axis distraction & lateral distraction    Soft tissue mobilization Lt hip post musculature- mostly piriformis                     PT Education - 06/02/21 1324     Education Details see plan    Person(s) Educated Patient    Methods Explanation    Comprehension Verbalized understanding;Need further instruction              PT Short Term Goals - 04/24/21 1113       PT SHORT TERM GOAL #1   Title Patient will improve pain to 5/10 in low back when standing.    Baseline up to 10    Status On-going      PT SHORT TERM GOAL #2   Title Patient will increase strength of right anf left hip flexion to 4-/5.    Baseline bil 4/5    Status Achieved      PT SHORT TERM GOAL #3   Title Patient will show increase lumbar range of motion to 50% of normal limits.    Baseline flexion  to mid-shin, Rt SB to knee joint line, Lt SB to 50% to knee joint line    Status Achieved      PT SHORT TERM GOAL #4   Title Patient will have understanding of inital HEP in order to complete at home.    Baseline independent with initial HEP    Status Achieved               PT Long Term Goals - 05/23/21 1118       PT LONG TERM GOAL #1   Title Patient will report increase strength in bilateral hip strength to 4/5 in order to stand for more than 45 mins to cook a meal with less breaks.    Baseline 4/5 strength, sometimes I can lean over on [place] to reduce pain and return to cooking    Status Partially Met      PT LONG TERM GOAL #2   Title pt will demo at least 5s of steady SLS    Baseline able to demo on Rt side, able to shift weight to the Lt but not able to tolerate full SLS    Status On-going      PT LONG TERM GOAL #3   Title Patient will improve lumbar range of motion to 75% WFL in order to perform activites of daily living safely.    Status Achieved      PT LONG TERM GOAL #4   Title Patient  will show full understanding of final HEP and aquatic HEP in order to perform independently.    Baseline requires further progression    Status On-going                   Plan - 06/02/21 1324     Clinical Impression Statement Pain seems to be centralizing toward pt hip joint- incr pain with compression and decreased pain with distraction, clicking that pt denies being present prior to accident- s/s consistent with labral tear. She continues to have a return of piriformis spasm but denied DN again today but did agree to [facility]. We discussed the possibilites related to hip labral involvement but would require MRI. She is choosing to continue with remainder of episode and then see how she feels since she has improved. I do think she would benefit from DN and she said she will think about it.    PT Treatment/Interventions ADLs/Self Care Home Management;Gait training;Stair training;Functional mobility training;Therapeutic activities;Therapeutic exercise;Balance training;Neuromuscular re-education;Aquatic Therapy;Electrical Stimulation;Patient/family education;Manual techniques;Passive range of motion;Dry needling;Energy conservation;Joint Manipulations;Traction;Spinal Manipulations    PT Next Visit Plan gross hip strengthening, core engagement, hip distraction- pt does not want to lay in water to get her hair wet    PT Home Exercise Plan [id number]    Consulted and Agree with Plan of Care Patient             Patient will benefit from skilled therapeutic intervention in order to improve the following deficits and impairments:  Abnormal gait, Decreased coordination, Decreased range of motion, Difficulty walking, Pain, Decreased activity tolerance, Decreased balance, Decreased endurance, Decreased strength, Postural dysfunction, Improper body mechanics, Increased fascial restricitons, Increased muscle spasms, Impaired perceived functional ability  Visit Diagnosis: Bilateral low back pain without  sciatica, unspecified chronicity  Repeated falls  Pain in left hip  Other abnormalities of gait and mobility     Problem List Patient Active Problem List   Diagnosis Date Noted   Bacteremia 06/28/2020   Hypothyroidism    Gastroesophageal reflux disease  Neuropathy    Pelvic fluid collection    Type 2 diabetes mellitus with hyperlipidemia (HCC)    SIRS due to infectious process with acute organ dysfunction ([place]) 06/24/2020   SIRS (systemic inflammatory response syndrome) ([place]) 06/24/2020   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 Hightower PT, DPT 06/02/21 1:37 PM  [facility] [place], [place], [address] Phone: [phone]   Fax:  [phone]  Name: Christy Carpenter MRN: [id number] Date of Birth: [birth year]

## 2021-06-06 ENCOUNTER — Other Ambulatory Visit: Payer: Self-pay

## 2021-06-06 ENCOUNTER — Ambulatory Visit (HOSPITAL_BASED_OUTPATIENT_CLINIC_OR_DEPARTMENT_OTHER): Payer: BC Managed Care – PPO | Admitting: Physical Therapy

## 2021-06-06 ENCOUNTER — Encounter (HOSPITAL_BASED_OUTPATIENT_CLINIC_OR_DEPARTMENT_OTHER): Payer: Self-pay | Admitting: Physical Therapy

## 2021-06-06 DIAGNOSIS — R293 Abnormal posture: Secondary | ICD-10-CM

## 2021-06-06 DIAGNOSIS — R262 Difficulty in walking, not elsewhere classified: Secondary | ICD-10-CM

## 2021-06-06 DIAGNOSIS — R2681 Unsteadiness on feet: Secondary | ICD-10-CM

## 2021-06-06 DIAGNOSIS — R2689 Other abnormalities of gait and mobility: Secondary | ICD-10-CM

## 2021-06-06 DIAGNOSIS — M6281 Muscle weakness (generalized): Secondary | ICD-10-CM

## 2021-06-06 DIAGNOSIS — G8929 Other chronic pain: Secondary | ICD-10-CM

## 2021-06-06 NOTE — Therapy (Signed)
[facility] [address] [place], [place], [address] Phone: [phone]   Fax:  [phone]  Physical Therapy Treatment  Patient Details  Name: Christy Carpenter MRN: [id number] Date of Birth: [birth year] Referring Provider (PT): Nelson Chimes PA-C   Encounter Date: 06/06/2021   PT End of Session - 06/06/21 1207     Visit Number 12    Number of Visits 22    Date for PT Re-Evaluation 07/04/21    Authorization Type MVA: BCBS    PT Start Time 1205    PT Stop Time 1250    PT Time Calculation (min) 45 min    Equipment Utilized During Treatment Other (comment)    Activity Tolerance Patient tolerated treatment well    Behavior During Therapy [facility] for tasks assessed/performed             Past Medical History:  Diagnosis Date   Cancer ([place])    endometreal ut   Diabetes mellitus    GERD (gastroesophageal reflux disease)    Hypercholesterolemia    Hypertension    Ileus ([place])    Neuropathy of both feet    Plantar fasciitis    Left foot    Past Surgical History:  Procedure Laterality Date   HAND LIGAMENT RECONSTRUCTION     ROBOTIC ASSISTED TOTAL HYSTERECTOMY WITH BILATERAL SALPINGO OOPHERECTOMY N/A 06/20/2020   Procedure: XI ROBOTIC ASSISTED TOTAL HYSTERECTOMY WITH BILATERAL SALPINGO OOPHORECTOMY, RIGHT URETERAL LYSIS;  Surgeon: [name], MD;  Location: [facility];  Service: Gynecology;  Laterality: N/A;   SENTINEL NODE BIOPSY N/A 06/20/2020   Procedure: SENTINEL NODE BIOPSY;  Surgeon: [name], MD;  Location: [facility];  Service: Gynecology;  Laterality: N/A;    There were no vitals filed for this visit.   Subjective Assessment - 06/06/21 1208     Subjective "Left groin discomofrt, feels like it need to pop. and my balance is bad"                                          PT Short Term Goals - 04/24/21 1113       PT SHORT TERM GOAL #1   Title Patient will improve pain to 5/10 in low back  when standing.    Baseline up to 10    Status On-going      PT SHORT TERM GOAL #2   Title Patient will increase strength of right anf left hip flexion to 4-/5.    Baseline bil 4/5    Status Achieved      PT SHORT TERM GOAL #3   Title Patient will show increase lumbar range of motion to 50% of normal limits.    Baseline flexion to mid-shin, Rt SB to knee joint line, Lt SB to 50% to knee joint line    Status Achieved      PT SHORT TERM GOAL #4   Title Patient will have understanding of inital HEP in order to complete at home.    Baseline independent with initial HEP    Status Achieved               PT Long Term Goals - 05/23/21 1118       PT LONG TERM GOAL #1   Title Patient will report increase strength in bilateral hip strength to 4/5 in order to stand for more than 45 mins to cook  a meal with less breaks.    Baseline 4/5 strength, sometimes I can lean over on [place] to reduce pain and return to cooking    Status Partially Met      PT LONG TERM GOAL #2   Title pt will demo at least 5s of steady SLS    Baseline able to demo on Rt side, able to shift weight to the Lt but not able to tolerate full SLS    Status On-going      PT LONG TERM GOAL #3   Title Patient will improve lumbar range of motion to 75% WFL in order to perform activites of daily living safely.    Status Achieved      PT LONG TERM GOAL #4   Title Patient will show full understanding of final HEP and aquatic HEP in order to perform independently.    Baseline requires further progression    Status On-going            AquaticREHABdocumentation: Water will reduce pain due to thermal shifts, allowing for better position and tolerance of manual treatment., Water will allow for reduced gait deviation due to reduced joint loading through buoyancy to help patient improve posture without excess stress and pain. , Pt requires the buoyancy of water for active assisted exercises with buoyancy supported for  strengthening & ROM exercises, Hydrostatic pressure also supports joints by unweighting joint load by at least 50 % in 3-4 feet depth water. 80% in chest to neck deep water., and Water will allow for reduced gait deviation due to reduced joint loading through buoyancy to help patient improve posture without excess stress and pain  Walking with water 1 foam dumbbells at side for traction x 4 widths  Stretching Hip flex stretch using noodle R/L 3 x 20-25 sec; deep ms massage to assist in lengthening muscle LB and core rotator stretching with hip hiking in above position. Piriformis stretch attempted sitting on bench. Strengthening and balance: Cues for tightening core for completion of noodle kick downs x 10 -small BOS feet together with water perturbations vision then vision eliminate -tandem stance -SLS -forward lunges x 4 widths         Plan - 06/06/21 1314     Clinical Impression Statement Piriformis spasms continues. Attempted stretch which pt has difficulty completing.  Used noodle for hip flexor stretching along with massage in area for added benefit. Pt declined supine suspension for better positioning for aquastretch and other manual techs. Worked on balance and core strength as tolerated.    Stability/Clinical Decision Making Evolving/Moderate complexity    Clinical Decision Making Moderate    Rehab Potential Good    PT Frequency 2x / week    PT Duration 6 weeks    PT Treatment/Interventions ADLs/Self Care Home Management;Gait training;Stair training;Functional mobility training;Therapeutic activities;Therapeutic exercise;Balance training;Neuromuscular re-education;Aquatic Therapy;Electrical Stimulation;Patient/family education;Manual techniques;Passive range of motion;Dry needling;Energy conservation;Joint Manipulations;Traction;Spinal Manipulations    PT Next Visit Plan gross hip strengthening, core engagement, hip distraction- pt does not want to lay in water to get her hair  wet    PT Home Exercise Plan [id number]             Patient will benefit from skilled therapeutic intervention in order to improve the following deficits and impairments:  Abnormal gait, Decreased coordination, Decreased range of motion, Difficulty walking, Pain, Decreased activity tolerance, Decreased balance, Decreased endurance, Decreased strength, Postural dysfunction, Improper body mechanics, Increased fascial restricitons, Increased muscle spasms, Impaired perceived functional ability  Visit  Diagnosis: Abnormal posture  Chronic pain of right knee  Difficulty in walking, not elsewhere classified  Muscle weakness (generalized)  Unsteadiness on feet  Other abnormalities of gait and mobility     Problem List Patient Active Problem List   Diagnosis Date Noted   Bacteremia 06/28/2020   Hypothyroidism    Gastroesophageal reflux disease    Neuropathy    Pelvic fluid collection    Type 2 diabetes mellitus with hyperlipidemia (HCC)    SIRS due to infectious process with acute organ dysfunction ([place]) 06/24/2020   SIRS (systemic inflammatory response syndrome) ([place]) 06/24/2020   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

## 2021-06-09 ENCOUNTER — Encounter (HOSPITAL_BASED_OUTPATIENT_CLINIC_OR_DEPARTMENT_OTHER): Payer: BC Managed Care – PPO | Attending: Family Medicine | Admitting: Physical Therapy

## 2021-06-09 ENCOUNTER — Other Ambulatory Visit: Payer: Self-pay

## 2021-06-09 ENCOUNTER — Encounter (HOSPITAL_BASED_OUTPATIENT_CLINIC_OR_DEPARTMENT_OTHER): Payer: Self-pay | Admitting: Physical Therapy

## 2021-06-09 DIAGNOSIS — R293 Abnormal posture: Secondary | ICD-10-CM | POA: Insufficient documentation

## 2021-06-09 DIAGNOSIS — R262 Difficulty in walking, not elsewhere classified: Secondary | ICD-10-CM | POA: Diagnosis present

## 2021-06-09 DIAGNOSIS — M6281 Muscle weakness (generalized): Secondary | ICD-10-CM | POA: Insufficient documentation

## 2021-06-09 DIAGNOSIS — M25561 Pain in right knee: Secondary | ICD-10-CM | POA: Insufficient documentation

## 2021-06-09 DIAGNOSIS — G8929 Other chronic pain: Secondary | ICD-10-CM | POA: Diagnosis present

## 2021-06-09 NOTE — Therapy (Signed)
[facility] [address] [place], [place], [address] Phone: [phone]   Fax:  [phone]  Physical Therapy Treatment  Patient Details  Name: Christy Carpenter MRN: [id number] Date of Birth: 05-14-1956 Referring Provider (PT): Nelson Chimes PA-C   Encounter Date: 06/09/2021   PT End of Session - 06/09/21 1104     Visit Number 13    Number of Visits 22    Date for PT Re-Evaluation 07/04/21    Authorization Type MVA: BCBS    PT Start Time 1103    PT Stop Time 1150    PT Time Calculation (min) 47 min    Activity Tolerance Patient tolerated treatment well    Behavior During Therapy WFL for tasks assessed/performed             Past Medical History:  Diagnosis Date   Cancer ([place])    endometreal ut   Diabetes mellitus    GERD (gastroesophageal reflux disease)    Hypercholesterolemia    Hypertension    Ileus ([place])    Neuropathy of both feet    Plantar fasciitis    Left foot    Past Surgical History:  Procedure Laterality Date   HAND LIGAMENT RECONSTRUCTION     ROBOTIC ASSISTED TOTAL HYSTERECTOMY WITH BILATERAL SALPINGO OOPHERECTOMY N/A 06/20/2020   Procedure: XI ROBOTIC ASSISTED TOTAL HYSTERECTOMY WITH BILATERAL SALPINGO OOPHORECTOMY, RIGHT URETERAL LYSIS;  Surgeon: [name], MD;  Location: [facility];  Service: Gynecology;  Laterality: N/A;   SENTINEL NODE BIOPSY N/A 06/20/2020   Procedure: SENTINEL NODE BIOPSY;  Surgeon: [name], MD;  Location: [facility];  Service: Gynecology;  Laterality: N/A;    There were no vitals filed for this visit.   Subjective Assessment - 06/09/21 1105     Subjective Lt groin is still bothering me with movement. My Lt thigh and knee don't hurt this morning. pain remains in Lt low back and SIJ area    Patient Stated Goals Decrease pain in order to walk more and cook without pain. ironing, dishes    Currently in Pain? Yes    Pain Score 2     Pain Location Buttocks    Pain Orientation  Left    Pain Descriptors / Indicators Aching    Aggravating Factors  walking 5-10 min    Pain Relieving Factors sit                               [facility] Adult PT Treatment/Exercise - 06/09/21 0001       Lumbar Exercises: Stretches   Passive Hamstring Stretch Limitations Center & adducted by PT    Other Lumbar Stretch Exercise hip ER by PT    Other Lumbar Stretch Exercise single knee fallout      Lumbar Exercises: Supine   AB Set Limitations with pillow squeeze bw knees      Modalities   Modalities Electrical Stimulation      Electrical Stimulation   Electrical Stimulation Location Lt hip    Electrical Stimulation Action IFC    Electrical Stimulation Parameters to tolerance, level 27 today    Electrical Stimulation Goals Pain      Manual Therapy   Soft tissue mobilization Lt hip piriformis, glut med/min, TFL                     PT Education - 06/09/21 1809     Education Details ESTIM in  clinic v spinal v dry needling    Person(s) Educated Patient    Methods Explanation    Comprehension Verbalized understanding;Need further instruction              PT Short Term Goals - 04/24/21 1113       PT SHORT TERM GOAL #1   Title Patient will improve pain to 5/10 in low back when standing.    Baseline up to 10    Status On-going      PT SHORT TERM GOAL #2   Title Patient will increase strength of right anf left hip flexion to 4-/5.    Baseline bil 4/5    Status Achieved      PT SHORT TERM GOAL #3   Title Patient will show increase lumbar range of motion to 50% of normal limits.    Baseline flexion to mid-shin, Rt SB to knee joint line, Lt SB to 50% to knee joint line    Status Achieved      PT SHORT TERM GOAL #4   Title Patient will have understanding of inital HEP in order to complete at home.    Baseline independent with initial HEP    Status Achieved               PT Long Term Goals - 05/23/21 1118       PT LONG TERM  GOAL #1   Title Patient will report increase strength in bilateral hip strength to 4/5 in order to stand for more than 45 mins to cook a meal with less breaks.    Baseline 4/5 strength, sometimes I can lean over on [place] to reduce pain and return to cooking    Status Partially Met      PT LONG TERM GOAL #2   Title pt will demo at least 5s of steady SLS    Baseline able to demo on Rt side, able to shift weight to the Lt but not able to tolerate full SLS    Status On-going      PT LONG TERM GOAL #3   Title Patient will improve lumbar range of motion to 75% WFL in order to perform activites of daily living safely.    Status Achieved      PT LONG TERM GOAL #4   Title Patient will show full understanding of final HEP and aquatic HEP in order to perform independently.    Baseline requires further progression    Status On-going                   Plan - 06/09/21 1135     Clinical Impression Statement pt cont to have tightness and trigger points with referral patterns in her Lt hip that were addressed again with STM techniques. Pt requested to try ESTIM today and it was placed on her hip at the end of the session. She plans to consider use of DN at next appointment.    PT Treatment/Interventions ADLs/Self Care Home Management;Gait training;Stair training;Functional mobility training;Therapeutic activities;Therapeutic exercise;Balance training;Neuromuscular re-education;Aquatic Therapy;Electrical Stimulation;Patient/family education;Manual techniques;Passive range of motion;Dry needling;Energy conservation;Joint Manipulations;Traction;Spinal Manipulations    PT Next Visit Plan gross hip strengthening, core engagement, hip distraction- pt does not want to lay in water to get her hair wet    PT [place] and Agree with Plan of Care Patient             Patient will benefit from skilled therapeutic intervention  in order to improve the following deficits  and impairments:  Abnormal gait, Decreased coordination, Decreased range of motion, Difficulty walking, Pain, Decreased activity tolerance, Decreased balance, Decreased endurance, Decreased strength, Postural dysfunction, Improper body mechanics, Increased fascial restricitons, Increased muscle spasms, Impaired perceived functional ability  Visit Diagnosis: Abnormal posture  Chronic pain of right knee  Difficulty in walking, not elsewhere classified  Muscle weakness (generalized)     Problem List Patient Active Problem List   Diagnosis Date Noted   Bacteremia 06/28/2020   Hypothyroidism    Gastroesophageal reflux disease    Neuropathy    Pelvic fluid collection    Type 2 diabetes mellitus with hyperlipidemia ([place])    SIRS due to infectious process with acute organ dysfunction ([place]) 06/24/2020   SIRS (systemic inflammatory response syndrome) ([place]) 06/24/2020   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 Dimitry Holsworth PT, DPT 06/09/21 6:10 PM  [facility] [place], [place], [address] Phone: [phone]   Fax:  [phone]  Name: SYRETTA KOCHEL MRN: [id number] Date of Birth: [birth year]

## 2021-06-11 ENCOUNTER — Other Ambulatory Visit: Payer: Self-pay

## 2021-06-11 ENCOUNTER — Ambulatory Visit (HOSPITAL_BASED_OUTPATIENT_CLINIC_OR_DEPARTMENT_OTHER): Payer: BC Managed Care – PPO | Admitting: Physical Therapy

## 2021-06-11 ENCOUNTER — Encounter (HOSPITAL_BASED_OUTPATIENT_CLINIC_OR_DEPARTMENT_OTHER): Payer: Self-pay | Admitting: Physical Therapy

## 2021-06-11 DIAGNOSIS — R262 Difficulty in walking, not elsewhere classified: Secondary | ICD-10-CM

## 2021-06-11 DIAGNOSIS — M25561 Pain in right knee: Secondary | ICD-10-CM

## 2021-06-11 DIAGNOSIS — M6281 Muscle weakness (generalized): Secondary | ICD-10-CM

## 2021-06-11 DIAGNOSIS — G8929 Other chronic pain: Secondary | ICD-10-CM

## 2021-06-11 DIAGNOSIS — R2681 Unsteadiness on feet: Secondary | ICD-10-CM

## 2021-06-11 DIAGNOSIS — R293 Abnormal posture: Secondary | ICD-10-CM | POA: Diagnosis not present

## 2021-06-11 DIAGNOSIS — R2689 Other abnormalities of gait and mobility: Secondary | ICD-10-CM

## 2021-06-11 NOTE — Therapy (Signed)
[facility] [address] [place], [place], [address] Phone: [phone]   Fax:  [phone]  Physical Therapy Treatment  Patient Details  Name: Christy Carpenter MRN: [id number] Date of Birth: 12-Sep-1955 Referring Provider (PT): Nelson Chimes PA-C   Encounter Date: 06/11/2021   PT End of Session - 06/11/21 1207     Visit Number 14    Number of Visits 22    Date for PT Re-Evaluation 07/04/21    Authorization Type MVA: BCBS    PT Start Time 1122    PT Stop Time 1205    PT Time Calculation (min) 43 min             Past Medical History:  Diagnosis Date   Cancer ([place])    endometreal ut   Diabetes mellitus    GERD (gastroesophageal reflux disease)    Hypercholesterolemia    Hypertension    Ileus ([place])    Neuropathy of both feet    Plantar fasciitis    Left foot    Past Surgical History:  Procedure Laterality Date   HAND LIGAMENT RECONSTRUCTION     ROBOTIC ASSISTED TOTAL HYSTERECTOMY WITH BILATERAL SALPINGO OOPHERECTOMY N/A 06/20/2020   Procedure: XI ROBOTIC ASSISTED TOTAL HYSTERECTOMY WITH BILATERAL SALPINGO OOPHORECTOMY, RIGHT URETERAL LYSIS;  Surgeon: [name], MD;  [facility];  Service: Gynecology;  Laterality: N/A;   SENTINEL NODE BIOPSY N/A 06/20/2020   Procedure: SENTINEL NODE BIOPSY;  Surgeon: [name], MD;  Location: [facility];  Service: Gynecology;  Laterality: N/A;    There were no vitals filed for this visit.   Subjective Assessment - 06/11/21 1134     Subjective Groin hasn't been quitas bad                                          PT Short Term Goals - 04/24/21 1113       PT SHORT TERM GOAL #1   Title Patient will improve pain to 5/10 in low back when standing.    Baseline up to 10    Status On-going      PT SHORT TERM GOAL #2   Title Patient will increase strength of right anf left hip flexion to 4-/5.    Baseline bil 4/5    Status Achieved      PT  SHORT TERM GOAL #3   Title Patient will show increase lumbar range of motion to 50% of normal limits.    Baseline flexion to mid-shin, Rt SB to knee joint line, Lt SB to 50% to knee joint line    Status Achieved      PT SHORT TERM GOAL #4   Title Patient will have understanding of inital HEP in order to complete at home.    Baseline independent with initial HEP    Status Achieved               PT Long Term Goals - 05/23/21 1118       PT LONG TERM GOAL #1   Title Patient will report increase strength in bilateral hip strength to 4/5 in order to stand for more than 45 mins to cook a meal with less breaks.    Baseline 4/5 strength, sometimes I can lean over on [place] to reduce pain and return to cooking    Status Partially Met      PT  LONG TERM GOAL #2   Title pt will demo at least 5s of steady SLS    Baseline able to demo on Rt side, able to shift weight to the Lt but not able to tolerate full SLS    Status On-going      PT LONG TERM GOAL #3   Title Patient will improve lumbar range of motion to 75% WFL in order to perform activites of daily living safely.    Status Achieved      PT LONG TERM GOAL #4   Title Patient will show full understanding of final HEP and aquatic HEP in order to perform independently.    Baseline requires further progression    Status On-going            AquaticREHABdocumentation: Water will reduce pain due to thermal shifts, allowing for better position and tolerance of manual treatment., Water will allow for reduced gait deviation due to reduced joint loading through buoyancy to help patient improve posture without excess stress and pain. , Pt requires the buoyancy of water for active assisted exercises with buoyancy supported for strengthening & ROM exercises, Hydrostatic pressure also supports joints by unweighting joint load by at least 50 % in 3-4 feet depth water. 80% in chest to neck deep water., and Water will allow for reduced gait deviation  due to reduced joint loading through buoyancy to help patient improve posture without excess stress and pain   Walking with water 1 foam dumbbells at side for core engagement x 4 widths forward, backward and side stepping.   Stretching Standing Hip flex stretch and quad using noodle R/L 3 x 20-25 sec each; ms massage to assist in lengthening muscle  Seated Piriformis stretch aquired rle and modified L Strengthening and balance: Cues for tightening core for completion of kick board push downs 2 x 12 -hip hinges 2 x12 -lateral rotations x 20 using kick board -hip hiking R/L x 10 water step. Pt with some discomfort left hip  -forward lunges x 4 widths  warm down       Plan - 06/11/21 1344     Clinical Impression Statement Groin pain decreased. Pt reports having relieff from overall discomfort while in pool but has return of all symptoms by the time she returns home. Able to attain good stretching extra time spent to ensure, then engage core with exercises.  Hip hiking submerged 75% with some left lb/hip discomfort.    Stability/Clinical Decision Making Evolving/Moderate complexity    Clinical Decision Making Moderate    Rehab Potential Good    PT Frequency 2x / week    PT Duration 6 weeks    PT Treatment/Interventions ADLs/Self Care Home Management;Gait training;Stair training;Functional mobility training;Therapeutic activities;Therapeutic exercise;Balance training;Neuromuscular re-education;Aquatic Therapy;Electrical Stimulation;Patient/family education;Manual techniques;Passive range of motion;Dry needling;Energy conservation;Joint Manipulations;Traction;Spinal Manipulations    PT Next Visit Plan hip and core strengtheing    PT Home Exercise Plan [id number]             Patient will benefit from skilled therapeutic intervention in order to improve the following deficits and impairments:  Abnormal gait, Decreased coordination, Decreased range of motion, Difficulty walking, Pain,  Decreased activity tolerance, Decreased balance, Decreased endurance, Decreased strength, Postural dysfunction, Improper body mechanics, Increased fascial restricitons, Increased muscle spasms, Impaired perceived functional ability  Visit Diagnosis: Abnormal posture  Other abnormalities of gait and mobility  Unsteadiness on feet  Chronic pain of right knee  Difficulty in walking, not elsewhere classified  Muscle weakness (generalized)  Problem List Patient Active Problem List   Diagnosis Date Noted   Bacteremia 06/28/2020   Hypothyroidism    Gastroesophageal reflux disease    Neuropathy    Pelvic fluid collection    Type 2 diabetes mellitus with hyperlipidemia ([facility])    SIRS due to infectious process with acute organ dysfunction ([place]) 06/24/2020   SIRS (systemic inflammatory response syndrome) ([place]) 06/24/2020   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

## 2021-06-15 ENCOUNTER — Encounter (HOSPITAL_COMMUNITY): Payer: Self-pay

## 2021-06-15 ENCOUNTER — Emergency Department (HOSPITAL_COMMUNITY): Payer: BC Managed Care – PPO

## 2021-06-15 ENCOUNTER — Emergency Department (HOSPITAL_COMMUNITY)
Admission: EM | Admit: 2021-06-15 | Discharge: 2021-06-16 | Disposition: A | Discharge status: Home / Self Care | Payer: BC Managed Care – PPO | Attending: Emergency Medicine | Admitting: Emergency Medicine

## 2021-06-15 ENCOUNTER — Other Ambulatory Visit: Payer: Self-pay

## 2021-06-15 DIAGNOSIS — Z8541 Personal history of malignant neoplasm of cervix uteri: Secondary | ICD-10-CM | POA: Diagnosis not present

## 2021-06-15 DIAGNOSIS — E860 Dehydration: Secondary | ICD-10-CM | POA: Insufficient documentation

## 2021-06-15 DIAGNOSIS — Z794 Long term (current) use of insulin: Secondary | ICD-10-CM | POA: Diagnosis not present

## 2021-06-15 DIAGNOSIS — Z7984 Long term (current) use of oral hypoglycemic drugs: Secondary | ICD-10-CM | POA: Insufficient documentation

## 2021-06-15 DIAGNOSIS — I509 Heart failure, unspecified: Secondary | ICD-10-CM | POA: Diagnosis not present

## 2021-06-15 DIAGNOSIS — J101 Influenza due to other identified influenza virus with other respiratory manifestations: Secondary | ICD-10-CM | POA: Insufficient documentation

## 2021-06-15 DIAGNOSIS — E039 Hypothyroidism, unspecified: Secondary | ICD-10-CM | POA: Insufficient documentation

## 2021-06-15 DIAGNOSIS — E119 Type 2 diabetes mellitus without complications: Secondary | ICD-10-CM | POA: Diagnosis not present

## 2021-06-15 DIAGNOSIS — R079 Chest pain, unspecified: Secondary | ICD-10-CM | POA: Diagnosis present

## 2021-06-15 DIAGNOSIS — I110 Hypertensive heart disease with heart failure: Secondary | ICD-10-CM | POA: Diagnosis not present

## 2021-06-15 DIAGNOSIS — Z20822 Contact with and (suspected) exposure to covid-19: Secondary | ICD-10-CM | POA: Diagnosis not present

## 2021-06-15 DIAGNOSIS — E669 Obesity, unspecified: Secondary | ICD-10-CM | POA: Insufficient documentation

## 2021-06-15 DIAGNOSIS — Z79899 Other long term (current) drug therapy: Secondary | ICD-10-CM | POA: Diagnosis not present

## 2021-06-15 LAB — RESP PANEL BY RT-PCR (FLU A&B, COVID) ARPGX2
Influenza A by PCR: POSITIVE — AB
Influenza B by PCR: NEGATIVE
SARS Coronavirus 2 by RT PCR: NEGATIVE

## 2021-06-15 LAB — COMPREHENSIVE METABOLIC PANEL
ALT: 61 U/L — ABNORMAL HIGH (ref 0–44)
AST: 137 U/L — ABNORMAL HIGH (ref 15–41)
Albumin: 4 g/dL (ref 3.5–5.0)
Alkaline Phosphatase: 64 U/L (ref 38–126)
Anion gap: 8 (ref 5–15)
BUN: 39 mg/dL — ABNORMAL HIGH (ref 8–23)
CO2: 29 mmol/L (ref 22–32)
Calcium: 9.3 mg/dL (ref 8.9–10.3)
Chloride: 101 mmol/L (ref 98–111)
Creatinine, Ser: 1.31 mg/dL — ABNORMAL HIGH (ref 0.44–1.00)
GFR, Estimated: 46 mL/min — ABNORMAL LOW (ref >60)
Glucose, Bld: 111 mg/dL — ABNORMAL HIGH (ref 70–99)
Potassium: 4.2 mmol/L (ref 3.5–5.1)
Sodium: 138 mmol/L (ref 135–145)
Total Bilirubin: 0.3 mg/dL (ref 0.3–1.2)
Total Protein: 8.3 g/dL — ABNORMAL HIGH (ref 6.5–8.1)

## 2021-06-15 LAB — TROPONIN I (HIGH SENSITIVITY): Troponin I (High Sensitivity): 5 ng/L (ref <18)

## 2021-06-15 LAB — CBC WITH DIFFERENTIAL/PLATELET
Abs Immature Granulocytes: 0.01 10*3/uL (ref 0.00–0.07)
Basophils Absolute: 0 10*3/uL (ref 0.0–0.1)
Basophils Relative: 1 %
Eosinophils Absolute: 0.3 10*3/uL (ref 0.0–0.5)
Eosinophils Relative: 4 %
HCT: 31.5 % — ABNORMAL LOW (ref 36.0–46.0)
Hemoglobin: 10.6 g/dL — ABNORMAL LOW (ref 12.0–15.0)
Immature Granulocytes: 0 %
Lymphocytes Relative: 18 %
Lymphs Abs: 1.1 10*3/uL (ref 0.7–4.0)
MCH: 28.7 pg (ref 26.0–34.0)
MCHC: 33.7 g/dL (ref 30.0–36.0)
MCV: 85.4 fL (ref 80.0–100.0)
Monocytes Absolute: 0.7 10*3/uL (ref 0.1–1.0)
Monocytes Relative: 13 %
Neutro Abs: 3.8 10*3/uL (ref 1.7–7.7)
Neutrophils Relative %: 64 %
Platelets: 212 10*3/uL (ref 150–400)
RBC: 3.69 MIL/uL — ABNORMAL LOW (ref 3.87–5.11)
RDW: 14 % (ref 11.5–15.5)
WBC: 5.9 10*3/uL (ref 4.0–10.5)
nRBC: 0 % (ref 0.0–0.2)

## 2021-06-15 LAB — BRAIN NATRIURETIC PEPTIDE: B Natriuretic Peptide: 12.7 pg/mL (ref 0.0–100.0)

## 2021-06-15 MED ORDER — SODIUM CHLORIDE 0.9 % IV BOLUS
1000.0000 mL | Freq: Once | INTRAVENOUS | Status: AC
  Administered 2021-06-15: 23:00:00 1000 mL via INTRAVENOUS

## 2021-06-15 MED ORDER — OSELTAMIVIR PHOSPHATE 75 MG PO CAPS
75.0000 mg | ORAL_CAPSULE | Freq: Once | ORAL | Status: AC
  Administered 2021-06-15: 23:00:00 75 mg via ORAL
  Filled 2021-06-15: qty 1

## 2021-06-15 MED ORDER — ACETAMINOPHEN 325 MG PO TABS
650.0000 mg | ORAL_TABLET | Freq: Once | ORAL | Status: AC | PRN
  Administered 2021-06-15: 23:00:00 650 mg via ORAL
  Filled 2021-06-15: qty 2

## 2021-06-15 MED ORDER — PROCHLORPERAZINE EDISYLATE 10 MG/2ML IJ SOLN
5.0000 mg | Freq: Once | INTRAMUSCULAR | Status: AC
  Administered 2021-06-15: 5 mg via INTRAVENOUS
  Filled 2021-06-15: qty 2

## 2021-06-15 NOTE — ED Provider Notes (Signed)
[facility] DEPT Provider Note   CSN: [id number] Arrival date & time: 06/15/21  2051     History Chief Complaint  Patient presents with   Chest Pain   Shortness of Breath   Cough    REBBIE LAURICELLA is a 65 y.o. female.  HPI Patient presents with about 4 days of dyspnea, cough, congestion, fatigue.  Patient has multiple medical issues including CHF, diabetes, but was generally well prior to the onset of illness.  Now since onset no relief with anything.  No confusion, disorientation, abdominal pain.  Chest pain seems to be associate with coughing. Onset was probably within the past 48 hours, though the patient is a tangential historian.  Since that time no relief with anything, as above.     Past Medical History:  Diagnosis Date   Cancer ([place])    endometreal ut   Diabetes mellitus    GERD (gastroesophageal reflux disease)    Hypercholesterolemia    Hypertension    Ileus ([facility])    Neuropathy of both feet    Plantar fasciitis    Left foot    Patient Active Problem List   Diagnosis Date Noted   Bacteremia 06/28/2020   Hypothyroidism    Gastroesophageal reflux disease    Neuropathy    Pelvic fluid collection    Type 2 diabetes mellitus with hyperlipidemia ([place])    SIRS due to infectious process with acute organ dysfunction ([place]) 06/24/2020   SIRS (systemic inflammatory response syndrome) ([place]) 06/24/2020   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   Endometriosis    Retroperitoneal fibrosis    Diabetic neuropathy ([place]) 02/23/2020   Endometrial cancer ([place]) 02/23/2020   Obesity (BMI 30.0-34.9) 02/23/2020   Chest pain 08/19/2017   Uncontrolled type 2 diabetes mellitus with hyperglycemia ([place]) 08/19/2017   Essential hypertension 08/19/2017   Hypercholesterolemia     Past Surgical History:  Procedure Laterality Date   HAND LIGAMENT RECONSTRUCTION     ROBOTIC ASSISTED TOTAL HYSTERECTOMY WITH BILATERAL SALPINGO OOPHERECTOMY N/A 06/20/2020    Procedure: XI ROBOTIC ASSISTED TOTAL HYSTERECTOMY WITH BILATERAL SALPINGO OOPHORECTOMY, RIGHT URETERAL LYSIS;  Surgeon: [name], MD;  [facility];  Service: Gynecology;  Laterality: N/A;   SENTINEL NODE BIOPSY N/A 06/20/2020   Procedure: SENTINEL NODE BIOPSY;  Surgeon: [name], MD;  Location[facility];  Service: Gynecology;  Laterality: N/A;     OB History   No obstetric history on file.     Family History  Problem Relation Age of Onset   Diabetes Mellitus II Mother    Stroke Mother    Hypertension Mother    Stroke Brother    Diabetes Mellitus II Brother    Hypertension Brother    Cancer Paternal Aunt        ovarian    Social History   Tobacco Use   Smoking status: Never   Smokeless tobacco: Never  Vaping Use   Vaping Use: Never used  Substance Use Topics   Alcohol use: No   Drug use: No    Home Medications Prior to Admission medications   Medication Sig Start Date End Date Taking? Authorizing Provider  amitriptyline (ELAVIL) 25 MG tablet Take 25 mg by mouth at bedtime.    [provider]  amitriptyline (ELAVIL) 50 MG tablet Take 50 mg by mouth at bedtime. 11/25/20   [provider]  amoxicillin-clavulanate (AUGMENTIN) 875-125 MG tablet Take 1 tablet by mouth 2 (two) times daily. 07/08/20   [name], MD  atorvastatin (LIPITOR) 40 MG tablet Take 1 tablet (40 mg total) by mouth daily at 6 PM. 08/20/17   Kerney Elbe, DO  Continuous Blood Gluc Sensor (FREESTYLE LIBRE 2 SENSOR) MISC  06/13/20   [provider]  dapagliflozin propanediol (FARXIGA) 10 MG TABS tablet Take 10 mg by mouth daily.     [provider]  fenofibrate micronized (LOFIBRA) 134 MG capsule Take 134 mg by mouth daily.    [provider]  ibuprofen (ADVIL) 200 MG tablet Take 200-400 mg by mouth every 6 (six) hours as needed.    [provider]  levothyroxine (SYNTHROID) 50 MCG tablet Take 50 mcg by mouth daily before breakfast.     [provider]  losartan-hydrochlorothiazide (HYZAAR) 100-25 MG tablet Take 1 tablet by mouth daily.    [provider]  meloxicam (MOBIC) 7.5 MG tablet Take 1 tablet (7.5 mg total) by mouth daily. 12/02/20   Tasia Catchings, Amy V, PA-C  NOVOLIN N FLEXPEN 100 UNIT/ML Kiwkpen Inject 75 Units into the skin 3 (three) times daily.  04/17/20   [provider]  NOVOLIN R FLEXPEN 100 UNIT/ML SOPN Inject 75 Units into the skin 3 (three) times daily.  04/17/20   [provider]  pregabalin (LYRICA) 150 MG capsule Take 150 mg by mouth 3 (three) times daily.  Patient not taking: Reported on 07/08/2020    [provider]  senna-docusate (SENOKOT-S) 8.6-50 MG tablet Take 2 tablets by mouth at bedtime. For AFTER surgery, do not take if having diarrhea Patient not taking: Reported on 07/08/2020 06/21/20   Joylene John D, NP  SitaGLIPtin-MetFORMIN HCl (JANUMET XR) 50-1000 MG TB24 Take 1 tablet by mouth 2 (two) times daily.    [provider]  tiZANidine (ZANAFLEX) 2 MG tablet Take 1 tablet (2 mg total) by mouth every 8 (eight) hours as needed for muscle spasms. 12/02/20   Tasia Catchings, Amy V, PA-C  traMADol (ULTRAM) 50 MG tablet Take 1 tablet (50 mg total) by mouth every 6 (six) hours as needed for severe pain. 07/08/20   [name], MD    Allergies    Byetta 10 mcg pen [exenatide], Dapagliflozin, Januvia [sitagliptin], and Invokana [canagliflozin]  Review of Systems   Review of Systems  Constitutional:        Per HPI, otherwise negative  HENT:         Per HPI, otherwise negative  Respiratory:         Per HPI, otherwise negative  Cardiovascular:        Per HPI, otherwise negative  Gastrointestinal:  Negative for vomiting.  Endocrine:       Negative aside from HPI  Genitourinary:        Neg aside from HPI   Musculoskeletal:        Per HPI, otherwise negative  Skin: Negative.   Neurological:  Negative for syncope.   Physical Exam Updated Vital Signs BP (!) 120/98 (BP  Location: Right Arm)   Pulse (!) 124   Temp 99.9 F (37.7 C) (Oral)   Resp (!) 25   Ht 5' (1.524 m)   Wt 97.5 kg   SpO2 97%   BMI 41.99 kg/m   Physical Exam Vitals and nursing note reviewed.  Constitutional:      General: She is not in acute distress.    Appearance: She is obese.  HENT:     Head: Normocephalic and atraumatic.  Eyes:     Conjunctiva/sclera: Conjunctivae normal.  Cardiovascular:  Rate and Rhythm: Regular rhythm. Tachycardia present.  Pulmonary:     Effort: Tachypnea present. No respiratory distress.     Breath sounds: Normal breath sounds. No stridor.  Abdominal:     General: There is no distension.  Skin:    General: Skin is warm and dry.  Neurological:     Mental Status: She is alert and oriented to person, place, and time.     Cranial Nerves: No cranial nerve deficit.    ED Results / Procedures / Treatments   Labs (all labs ordered are listed, but only abnormal results are displayed) Labs Reviewed  RESP PANEL BY RT-PCR (FLU A&B, COVID) ARPGX2 - Abnormal; Notable for the following components:      Result Value   Influenza A by PCR POSITIVE (*)    All other components within normal limits  CBC WITH DIFFERENTIAL/PLATELET  COMPREHENSIVE METABOLIC PANEL  URINALYSIS, ROUTINE W REFLEX MICROSCOPIC  BRAIN NATRIURETIC PEPTIDE  TROPONIN I (HIGH SENSITIVITY)    EKG None  Radiology DG Chest 2 View  Result Date: 06/15/2021 CLINICAL DATA:  Diabetes and hypertension, chest pain EXAM: CHEST - 2 VIEW COMPARISON:  Chest x-ray 06/23/2020, CT chest 06/24/2020 FINDINGS: The heart and mediastinal contours are unchanged. Slightly prominent hilar vasculature. No focal consolidation. No slightly increased interstitial markings. No pleural effusion. No pneumothorax. No acute osseous abnormality. IMPRESSION: Possible mild pulmonary edema. Electronically Signed   By: Iven Finn M.D.   On: 06/15/2021 22:13    Procedures Procedures   Medications Ordered in  ED Medications  sodium chloride 0.9 % bolus 1,000 mL (has no administration in time range)  oseltamivir (TAMIFLU) capsule 75 mg (has no administration in time range)    ED Course  I have reviewed the triage vital signs and the nursing notes.  Pertinent labs & imaging results that were available during my care of the patient were reviewed by me and considered in my medical decision making (see chart for details).   11:19 PM Patient aware of lab findings, generally reassuring/consistent, though with some evidence for mild dehydration.  However, patient is found to have influenza.  Given the duration of only 2 days since onset of symptoms patient is a candidate for Tamiflu.  She will receive this, received fluid resuscitation, given tachycardia, dehydration.  On signout the patient remains tachycardic, requiring additional resuscitation, monitoring, management.  Dr. Leonette Monarch is aware. MDM Rules/Calculators/A&P MDM Number of Diagnoses or Management Options Influenza A: new, needed workup   Amount and/or Complexity of Data Reviewed Clinical lab tests: ordered and reviewed Tests in the radiology section of CPT: ordered and reviewed Tests in the medicine section of CPT: reviewed and ordered Decide to obtain previous medical records or to obtain history from someone other than the patient: yes Review and summarize past medical records: yes Discuss the patient with other providers: yes Independent visualization of images, tracings, or specimens: yes  Risk of Complications, Morbidity, and/or Mortality Presenting problems: high Diagnostic procedures: high Management options: high  Critical Care Total time providing critical care: < 30 minutes  Patient Progress Patient progress: stable   Final Clinical Impression(s) / ED Diagnoses Final diagnoses:  Influenza A     Carmin Muskrat, MD 06/15/21 2356

## 2021-06-15 NOTE — ED Notes (Signed)
Attempt to collect labs on Pt unsuccessful.

## 2021-06-15 NOTE — ED Triage Notes (Signed)
Pt complains of chest pain, cough, and shortness of breath x 1 week.

## 2021-06-16 ENCOUNTER — Ambulatory Visit (HOSPITAL_BASED_OUTPATIENT_CLINIC_OR_DEPARTMENT_OTHER): Payer: BC Managed Care – PPO | Admitting: Physical Therapy

## 2021-06-16 LAB — TROPONIN I (HIGH SENSITIVITY): Troponin I (High Sensitivity): 6 ng/L (ref <18)

## 2021-06-16 MED ORDER — ONDANSETRON 4 MG PO TBDP: 4.0000 mg | ORAL_TABLET | Freq: Three times a day (TID) | ORAL | 0 refills | Status: DC | PRN

## 2021-06-16 MED ORDER — OSELTAMIVIR PHOSPHATE 75 MG PO CAPS: 75.0000 mg | ORAL_CAPSULE | Freq: Two times a day (BID) | ORAL | 0 refills | Status: DC

## 2021-06-18 ENCOUNTER — Ambulatory Visit (HOSPITAL_BASED_OUTPATIENT_CLINIC_OR_DEPARTMENT_OTHER): Payer: BC Managed Care – PPO | Admitting: Physical Therapy

## 2021-06-18 ENCOUNTER — Other Ambulatory Visit: Payer: Self-pay

## 2021-06-18 ENCOUNTER — Encounter (INDEPENDENT_AMBULATORY_CARE_PROVIDER_SITE_OTHER): Payer: Self-pay | Admitting: Bariatrics

## 2021-06-18 ENCOUNTER — Ambulatory Visit (INDEPENDENT_AMBULATORY_CARE_PROVIDER_SITE_OTHER): Payer: BC Managed Care – PPO | Admitting: Bariatrics

## 2021-06-18 VITALS — BP 128/81 | HR 91 | Temp 98.2°F | Ht 60.0 in | Wt 207.0 lb

## 2021-06-18 DIAGNOSIS — Z1331 Encounter for screening for depression: Secondary | ICD-10-CM

## 2021-06-18 DIAGNOSIS — E039 Hypothyroidism, unspecified: Secondary | ICD-10-CM

## 2021-06-18 DIAGNOSIS — R5383 Other fatigue: Secondary | ICD-10-CM | POA: Diagnosis not present

## 2021-06-18 DIAGNOSIS — I152 Hypertension secondary to endocrine disorders: Secondary | ICD-10-CM

## 2021-06-18 DIAGNOSIS — R0602 Shortness of breath: Secondary | ICD-10-CM | POA: Diagnosis not present

## 2021-06-18 DIAGNOSIS — E1169 Type 2 diabetes mellitus with other specified complication: Secondary | ICD-10-CM

## 2021-06-18 DIAGNOSIS — E1159 Type 2 diabetes mellitus with other circulatory complications: Secondary | ICD-10-CM | POA: Diagnosis not present

## 2021-06-18 DIAGNOSIS — E1165 Type 2 diabetes mellitus with hyperglycemia: Secondary | ICD-10-CM | POA: Diagnosis not present

## 2021-06-18 DIAGNOSIS — R748 Abnormal levels of other serum enzymes: Secondary | ICD-10-CM

## 2021-06-18 DIAGNOSIS — E785 Hyperlipidemia, unspecified: Secondary | ICD-10-CM

## 2021-06-18 DIAGNOSIS — Z6841 Body Mass Index (BMI) 40.0 and over, adult: Secondary | ICD-10-CM

## 2021-06-18 DIAGNOSIS — E559 Vitamin D deficiency, unspecified: Secondary | ICD-10-CM

## 2021-06-19 ENCOUNTER — Encounter (INDEPENDENT_AMBULATORY_CARE_PROVIDER_SITE_OTHER): Payer: Self-pay | Admitting: Bariatrics

## 2021-06-19 DIAGNOSIS — E786 Lipoprotein deficiency: Secondary | ICD-10-CM | POA: Insufficient documentation

## 2021-06-19 DIAGNOSIS — E559 Vitamin D deficiency, unspecified: Secondary | ICD-10-CM | POA: Insufficient documentation

## 2021-06-19 DIAGNOSIS — R7989 Other specified abnormal findings of blood chemistry: Secondary | ICD-10-CM | POA: Insufficient documentation

## 2021-06-19 DIAGNOSIS — C55 Malignant neoplasm of uterus, part unspecified: Secondary | ICD-10-CM | POA: Insufficient documentation

## 2021-06-19 DIAGNOSIS — D72819 Decreased white blood cell count, unspecified: Secondary | ICD-10-CM | POA: Insufficient documentation

## 2021-06-19 DIAGNOSIS — D573 Sickle-cell trait: Secondary | ICD-10-CM | POA: Insufficient documentation

## 2021-06-19 LAB — LIPID PANEL WITH LDL/HDL RATIO
Cholesterol, Total: 135 mg/dL (ref 100–199)
HDL: 22 mg/dL — ABNORMAL LOW (ref >39)
LDL Chol Calc (NIH): 79 mg/dL (ref 0–99)
LDL/HDL Ratio: 3.6 ratio — ABNORMAL HIGH (ref 0.0–3.2)
Triglycerides: 202 mg/dL — ABNORMAL HIGH (ref 0–149)
VLDL Cholesterol Cal: 34 mg/dL (ref 5–40)

## 2021-06-19 LAB — TSH: TSH: 4.9 u[IU]/mL — ABNORMAL HIGH (ref 0.450–4.500)

## 2021-06-19 LAB — T3: T3, Total: 153 ng/dL (ref 71–180)

## 2021-06-19 LAB — T4, FREE: Free T4: 1.31 ng/dL (ref 0.82–1.77)

## 2021-06-19 LAB — VITAMIN D 25 HYDROXY (VIT D DEFICIENCY, FRACTURES): Vit D, 25-Hydroxy: 14.6 ng/mL — ABNORMAL LOW (ref 30.0–100.0)

## 2021-06-19 LAB — HEMOGLOBIN A1C
Est. average glucose Bld gHb Est-mCnc: 209 mg/dL
Hgb A1c MFr Bld: 8.9 % — ABNORMAL HIGH (ref 4.8–5.6)

## 2021-06-19 LAB — INSULIN, RANDOM: INSULIN: 319 u[IU]/mL — ABNORMAL HIGH (ref 2.6–24.9)

## 2021-06-19 NOTE — Progress Notes (Signed)
Dear Christy Drivers, PA,   Thank you for referring Christy Carpenter to our clinic. The following note includes my evaluation and treatment recommendations.  Chief Complaint:   Christy Carpenter (MR# [id number]) is a 65 y.o. female who presents for evaluation and treatment of obesity and related comorbidities. Current BMI is Body mass index is 40.43 kg/m. Christy Carpenter has been struggling with her weight for many years and has been unsuccessful in either losing weight, maintaining weight loss, or reaching her healthy weight goal.  Christy Carpenter is currently in the action stage of change and ready to dedicate time achieving and maintaining a healthier weight. Christy Carpenter is interested in becoming our patient and working on intensive lifestyle modifications including (but not limited to) diet and exercise for weight loss.  Christy Carpenter's habits were reviewed today and are as follows: Her family eats meals together, she thinks her family will eat healthier with her, her desired weight loss is 52 pounds, she has been heavy most of her life, she started gaining weight after having a hysterectomy, her heaviest weight ever was 217 pounds, she craves sweets and cakes, she snacks frequently in the evenings, she is frequently drinking liquids with calories, she frequently makes poor food choices, she has problems with excessive hunger, she frequently eats larger portions than normal, and she struggles with emotional eating.  Depression Screen Christy Carpenter's Food and Mood (modified PHQ-9) score was 8.  Depression screen PHQ 2/9 06/18/2021  Decreased Interest 1  Down, Depressed, Hopeless 0  PHQ - 2 Score 1  Altered sleeping 2  Tired, decreased energy 3  Change in appetite 1  Feeling bad or failure about yourself  0  Trouble concentrating 0  Moving slowly or fidgety/restless 1  Suicidal thoughts 0  PHQ-9 Score 8  Difficult doing work/chores Not difficult at all   Subjective:   1. Other fatigue Christy Carpenter denies daytime somnolence and reports  waking up still tired. Patent has a history of symptoms of morning fatigue and snoring. Christy Carpenter generally gets 6 hours of sleep per night, and states that she has poor quality sleep. Snoring is present. Apneic episodes are not present. Epworth Sleepiness Score is 4.  With certain activities.  IC done.  2. SOB (shortness of breath) on exertion Christy Carpenter notes increasing shortness of breath with exercising and seems to be worsening over time with weight gain. She notes getting out of breath sooner with activity than she used to. This has gotten worse recently. Christy Carpenter denies shortness of breath at rest or orthopnea.  With certain activities.  3. Uncontrolled type 2 diabetes mellitus with hyperglycemia (HCC) Taking Janumet XR, Novolin N, and Novolin R.  FBS 70, 80, 90s.  Highest 132.   Lab Results  Component Value Date   HGBA1C 8.9 (H) 06/18/2021   HGBA1C 8.2 (H) 06/11/2020   HGBA1C 11.1 (H) 05/02/2020   Lab Results  Component Value Date   LDLCALC 79 06/18/2021   CREATININE 1.31 (H) 06/15/2021   Lab Results  Component Value Date   INSULIN 319.0 (H) 06/18/2021   4. Hypertension associated with diabetes ([place]) Review: taking medications as instructed, no medication side effects noted, no chest pain on exertion, no dyspnea on exertion, no swelling of ankles. Taking losartan/HCTZ.  BP Readings from Last 3 Encounters:  06/18/21 128/81  06/16/21 121/64  12/02/20 (!) 143/62   5. Hyperlipidemia associated with type 2 diabetes mellitus ([place]) Christy Carpenter has hyperlipidemia and has been trying to improve her cholesterol levels with intensive lifestyle  modification including a low saturated fat diet, exercise and weight loss. She denies any chest pain, claudication or myalgias.  Taking fenofibrate.  Lab Results  Component Value Date   ALT 61 (H) 06/15/2021   AST 137 (H) 06/15/2021   ALKPHOS 64 06/15/2021   BILITOT 0.3 06/15/2021   Lab Results  Component Value Date   CHOL 135 06/18/2021   HDL 22 (L)  06/18/2021   LDLCALC 79 06/18/2021   TRIG 202 (H) 06/18/2021   CHOLHDL 9.0 08/20/2017   6. Acquired hypothyroidism Taking levothyroxine 50 mcg daily.  Lab Results  Component Value Date   TSH 4.900 (H) 06/18/2021    7. Elevated liver enzymes CT abdomen on 09/27/2020.  On 06/15/2021, AST 137, ALT 60.  8. Vitamin D deficiency She is not taking a vitamin D supplement.  Lab Results  Component Value Date   VD25OH 14.6 (L) 06/18/2021   9. Depression screen Christy Carpenter was screened for depression as part of her new patient workup today.  PHQ-9 is 8.  Assessment/Plan:   1. Other fatigue Io does feel that her weight is causing her energy to be lower than it should be. Fatigue may be related to obesity, depression or many other causes. Labs will be ordered, and in the meanwhile, Mannat will focus on self care including making healthy food choices, increasing physical activity and focusing on stress reduction.  Gradually increase activities.  2. SOB (shortness of breath) on exertion Zahriah does feel that she gets out of breath more easily that she used to when she exercises. Kippy's shortness of breath appears to be obesity related and exercise induced. She has agreed to work on weight loss and gradually increase exercise to treat her exercise induced shortness of breath. Will continue to monitor closely.  Gradually increase activities.  3. Uncontrolled type 2 diabetes mellitus with hyperglycemia (HCC) Good blood sugar control is important to decrease the likelihood of diabetic complications such as nephropathy, neuropathy, limb loss, blindness, coronary artery disease, and death. Intensive lifestyle modification including diet, exercise and weight loss are the first line of treatment for diabetes. Continue medications, Novolin R 38 units three times daily and continue other insulin.  Check FBS two hours postprandial and if sick.  - Hemoglobin A1c - Insulin, random  4. Hypertension associated with  diabetes ([place]) Daily is working on healthy weight loss and exercise to improve blood pressure control. We will watch for signs of hypotension as she continues her lifestyle modifications.  Continue medication.  5. Hyperlipidemia associated with type 2 diabetes mellitus ([place]) Cardiovascular risk and specific lipid/LDL goals reviewed.  We discussed several lifestyle modifications today and Christy Carpenter will continue to work on diet, exercise and weight loss efforts. Orders and follow up as documented in patient record. Continue medication.  Counseling Intensive lifestyle modifications are the first line treatment for this issue. Dietary changes: Increase soluble fiber. Decrease simple carbohydrates. Exercise changes: Moderate to vigorous-intensity aerobic activity 150 minutes per week if tolerated. Lipid-lowering medications: see documented in medical record.  - Lipid Panel With LDL/HDL Ratio  6. Acquired hypothyroidism Patient with long-standing hypothyroidism, on levothyroxine therapy. She appears euthyroid. Orders and follow up as documented in patient record.  Continue levothyroxine.  Counseling Good thyroid control is important for overall health. Supratherapeutic thyroid levels are dangerous and will not improve weight loss results. The correct way to take levothyroxine is fasting, with water, separated by at least 30 minutes from breakfast, and separated by more than 4 hours from calcium, iron,  multivitamins, acid reflux medications (PPIs).  - T3 - T4, free - TSH  7. Elevated liver enzymes Will follow liver enzymes over time, PCP and our office.   8. Vitamin D deficiency Will check vitamin D level today, as per below.  - VITAMIN D 25 Hydroxy (Vit-D Deficiency, Fractures)  9. Depression screen Christy Carpenter had a positive depression screening. Depression is commonly associated with obesity and often results in emotional eating behaviors. We will monitor this closely and work on CBT to help improve  the non-hunger eating patterns. Referral to Psychology may be required if no improvement is seen as she continues in our clinic.  10. Class 3 severe obesity with serious comorbidity and body mass index (BMI) of 40.0 to 44.9 in adult, unspecified obesity type (HCC)  Christy Carpenter is currently in the action stage of change and her goal is to continue with weight loss efforts. I recommend Christy Carpenter begin the structured treatment plan as follows:  She has agreed to the Category 2 Plan.  She will work on meal planning and eliminating regular soda.  Reviewed labs from 06/15/2021, including CMP, CBC, and glucose.  Exercise goals:  Taking aquatic therapy (left hip).  Behavioral modification strategies: increasing lean protein intake, decreasing simple carbohydrates, increasing vegetables, increasing water intake, decreasing eating out, no skipping meals, meal planning and cooking strategies, and keeping healthy foods in the home.  She was informed of the importance of frequent follow-up visits to maximize her success with intensive lifestyle modifications for her multiple health conditions. She was informed we would discuss her lab results at her next visit unless there is a critical issue that needs to be addressed sooner. Christy Carpenter agreed to keep her next visit at the agreed upon time to discuss these results.  Objective:   Blood pressure 128/81, pulse 91, temperature 98.2 F (36.8 C), height 5' (1.524 m), weight 207 lb (93.9 kg), SpO2 95 %. Body mass index is 40.43 kg/m.  Indirect Calorimeter completed today shows a VO2 of 260 and a REE of 1800.  Her calculated basal metabolic rate is 3710 thus her basal metabolic rate is better than expected.  General: Cooperative, alert, well developed, in no acute distress. HEENT: Conjunctivae and lids unremarkable. Cardiovascular: Regular rhythm.  Lungs: Normal work of breathing. Neurologic: No focal deficits.   Lab Results  Component Value Date   CREATININE 1.31 (H)  06/15/2021   BUN 39 (H) 06/15/2021   NA 138 06/15/2021   K 4.2 06/15/2021   CL 101 06/15/2021   CO2 29 06/15/2021   Lab Results  Component Value Date   ALT 61 (H) 06/15/2021   AST 137 (H) 06/15/2021   ALKPHOS 64 06/15/2021   BILITOT 0.3 06/15/2021   Lab Results  Component Value Date   HGBA1C 8.9 (H) 06/18/2021   HGBA1C 8.2 (H) 06/11/2020   HGBA1C 11.1 (H) 05/02/2020   HGBA1C 10.7 (H) 02/23/2020   Lab Results  Component Value Date   INSULIN 319.0 (H) 06/18/2021   Lab Results  Component Value Date   TSH 4.900 (H) 06/18/2021   Lab Results  Component Value Date   CHOL 135 06/18/2021   HDL 22 (L) 06/18/2021   LDLCALC 79 06/18/2021   TRIG 202 (H) 06/18/2021   CHOLHDL 9.0 08/20/2017   Lab Results  Component Value Date   WBC 5.9 06/15/2021   HGB 10.6 (L) 06/15/2021   HCT 31.5 (L) 06/15/2021   MCV 85.4 06/15/2021   PLT 212 06/15/2021   Attestation Statements:  Reviewed by clinician on day of visit: allergies, medications, problem list, medical history, surgical history, family history, social history, and previous encounter notes.  I, Water quality scientist, CMA, am acting as Location manager for [employer], DO  I have reviewed the above documentation for accuracy and completeness, and I agree with the above. Jearld Lesch, DO

## 2021-06-23 ENCOUNTER — Encounter (INDEPENDENT_AMBULATORY_CARE_PROVIDER_SITE_OTHER): Payer: Self-pay | Admitting: Bariatrics

## 2021-06-23 ENCOUNTER — Ambulatory Visit (HOSPITAL_BASED_OUTPATIENT_CLINIC_OR_DEPARTMENT_OTHER): Payer: BC Managed Care – PPO | Attending: Orthopedic Surgery | Admitting: Physical Therapy

## 2021-06-23 ENCOUNTER — Encounter (HOSPITAL_BASED_OUTPATIENT_CLINIC_OR_DEPARTMENT_OTHER): Payer: Self-pay | Admitting: Physical Therapy

## 2021-06-23 ENCOUNTER — Other Ambulatory Visit: Payer: Self-pay

## 2021-06-23 DIAGNOSIS — R2689 Other abnormalities of gait and mobility: Secondary | ICD-10-CM | POA: Diagnosis present

## 2021-06-23 DIAGNOSIS — R293 Abnormal posture: Secondary | ICD-10-CM | POA: Insufficient documentation

## 2021-06-23 NOTE — Therapy (Signed)
[facility] [address] [place], [place], [address] Phone: [phone]   Fax:  [phone]  Physical Therapy Treatment  Patient Details  Name: Christy Carpenter MRN: [id number] Date of Birth: 1955/12/03 Referring Provider (PT): Nelson Chimes PA-C   Encounter Date: 06/23/2021   PT End of Session - 06/23/21 1104     Visit Number 15    Number of Visits 22    Date for PT Re-Evaluation 07/04/21    Authorization Type MVA: BCBS    PT Start Time 1101    PT Stop Time 4008    PT Time Calculation (min) 44 min    Activity Tolerance Patient tolerated treatment well    Behavior During Therapy WFL for tasks assessed/performed             Past Medical History:  Diagnosis Date   Cancer ([place])    endometreal ut   Diabetes mellitus    Edema of both lower extremities    GERD (gastroesophageal reflux disease)    Hypercholesterolemia    Hypertension    Ileus ([place])    Neuropathy of both feet    Plantar fasciitis    Left foot    Past Surgical History:  Procedure Laterality Date   HAND LIGAMENT RECONSTRUCTION     ROBOTIC ASSISTED TOTAL HYSTERECTOMY WITH BILATERAL SALPINGO OOPHERECTOMY N/A 06/20/2020   Procedure: XI ROBOTIC ASSISTED TOTAL HYSTERECTOMY WITH BILATERAL SALPINGO OOPHORECTOMY, RIGHT URETERAL LYSIS;  Surgeon: [name], MD;  Location: [facility];  Service: Gynecology;  Laterality: N/A;   SENTINEL NODE BIOPSY N/A 06/20/2020   Procedure: SENTINEL NODE BIOPSY;  Surgeon: [name], MD;  Location: [facility];  Service: Gynecology;  Laterality: N/A;    There were no vitals filed for this visit.   Subjective Assessment - 06/23/21 1105     Subjective Went to hospital for flu but did not get admitted. I have gotten hydrated and can catch my breath. Any time I would get up it would stiffen right back up. groin pain is not as sharp as it has been.    Patient Stated Goals Decrease pain in order to walk more and cook without pain. ironing,  dishes    Currently in Pain? Yes    Pain Score 3     Pain Location Groin    Pain Orientation Left    Pain Descriptors / Indicators Aching;Sharp    Aggravating Factors  standing/walking 5-10 min    Pain Relieving Factors rest, sit                [facility] PT Assessment - 06/23/21 0001       PROM   Overall PROM Comments Lt hip flexion ROM full, no pain, soft end feel                           [facility] Adult PT Treatment/Exercise - 06/23/21 0001       Modalities   Modalities Moist Heat      Moist Heat Therapy   Number Minutes Moist Heat 10 Minutes    Moist Heat Location Other (comment)   Lt hip & quads     Manual Therapy   Manual therapy comments passive stretching to hamstrings, abduction and ER/IR at 90 hip flexion    Joint Mobilization long axis Lt hip distraction, lateral distraction in hooklying    Soft tissue mobilization LT piriformis, IASTM to Lt quads, STM to [place]  PT Short Term Goals - 04/24/21 1113       PT SHORT TERM GOAL #1   Title Patient will improve pain to 5/10 in low back when standing.    Baseline up to 10    Status On-going      PT SHORT TERM GOAL #2   Title Patient will increase strength of right anf left hip flexion to 4-/5.    Baseline bil 4/5    Status Achieved      PT SHORT TERM GOAL #3   Title Patient will show increase lumbar range of motion to 50% of normal limits.    Baseline flexion to mid-shin, Rt SB to knee joint line, Lt SB to 50% to knee joint line    Status Achieved      PT SHORT TERM GOAL #4   Title Patient will have understanding of inital HEP in order to complete at home.    Baseline independent with initial HEP    Status Achieved               PT Long Term Goals - 05/23/21 1118       PT LONG TERM GOAL #1   Title Patient will report increase strength in bilateral hip strength to 4/5 in order to stand for more than 45 mins to cook a meal with less breaks.     Baseline 4/5 strength, sometimes I can lean over on [place] to reduce pain and return to cooking    Status Partially Met      PT LONG TERM GOAL #2   Title pt will demo at least 5s of steady SLS    Baseline able to demo on Rt side, able to shift weight to the Lt but not able to tolerate full SLS    Status On-going      PT LONG TERM GOAL #3   Title Patient will improve lumbar range of motion to 75% WFL in order to perform activites of daily living safely.    Status Achieved      PT LONG TERM GOAL #4   Title Patient will show full understanding of final HEP and aquatic HEP in order to perform independently.    Baseline requires further progression    Status On-going                   Plan - 06/23/21 1140     Clinical Impression Statement Pt returns after having the flu. joint mobility in hip feels good today but has a lot of muscular tension that is recreated with palpation and stretching. Chose to hold on exercises today and asked her to continue hyrdating. will perform PN/re-evaluation at next visit to determine need for further PT- it was not appropriate to judge this today with so acutely having the flu.    PT Treatment/Interventions ADLs/Self Care Home Management;Gait training;Stair training;Functional mobility training;Therapeutic activities;Therapeutic exercise;Balance training;Neuromuscular re-education;Aquatic Therapy;Electrical Stimulation;Patient/family education;Manual techniques;Passive range of motion;Dry needling;Energy conservation;Joint Manipulations;Traction;Spinal Manipulations    PT Next Visit Plan ERO    PT Home Exercise Plan [id number]    Consulted and Agree with Plan of Care Patient             Patient will benefit from skilled therapeutic intervention in order to improve the following deficits and impairments:  Abnormal gait, Decreased coordination, Decreased range of motion, Difficulty walking, Pain, Decreased activity tolerance, Decreased balance,  Decreased endurance, Decreased strength, Postural dysfunction, Improper body mechanics, Increased fascial restricitons, Increased muscle spasms, Impaired  perceived functional ability  Visit Diagnosis: Abnormal posture  Other abnormalities of gait and mobility     Problem List Patient Active Problem List   Diagnosis Date Noted   Low HDL (under 40) 06/19/2021   High serum thyroid stimulating hormone (TSH) 06/19/2021   Vitamin D deficiency 06/19/2021   Leukopenia 06/19/2021   Sickle cell trait ([place]) 06/19/2021   Uterine cancer ([place]) 06/19/2021   Lumbar radiculopathy 05/07/2021   Bacteremia 06/28/2020   Hypothyroidism    Gastroesophageal reflux disease    Neuropathy    Pelvic fluid collection    Type 2 diabetes mellitus with hyperlipidemia ([place])    SIRS due to infectious process with acute organ dysfunction ([place]) 06/24/2020   SIRS (systemic inflammatory response syndrome) ([place]) 06/24/2020   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 Jerick Khachatryan PT, DPT 06/23/21 11:46 AM   [facility] [place], [place], [address] Phone: [phone]   Fax:  [phone]  Name: PORCHA DEBLANC MRN: [id number] Date of Birth: 27-Jul-1956

## 2021-06-30 ENCOUNTER — Encounter (HOSPITAL_BASED_OUTPATIENT_CLINIC_OR_DEPARTMENT_OTHER): Payer: Self-pay | Admitting: Physical Therapy

## 2021-06-30 ENCOUNTER — Other Ambulatory Visit: Payer: Self-pay

## 2021-06-30 ENCOUNTER — Ambulatory Visit (HOSPITAL_BASED_OUTPATIENT_CLINIC_OR_DEPARTMENT_OTHER): Payer: BC Managed Care – PPO | Admitting: Physical Therapy

## 2021-06-30 DIAGNOSIS — R2689 Other abnormalities of gait and mobility: Secondary | ICD-10-CM

## 2021-06-30 DIAGNOSIS — R293 Abnormal posture: Secondary | ICD-10-CM

## 2021-06-30 NOTE — Therapy (Signed)
[facility] [address] [place], [place], [address] Phone: [phone]   Fax:  [phone]  Physical Therapy Treatment  Patient Details  Name: Christy Carpenter MRN: [id number] Date of Birth: 12/23/1955 Referring Provider (PT): Nelson Chimes PA-C (Dr Rolena Infante)   Encounter Date: 06/30/2021   PT End of Session - 06/30/21 1104     Visit Number 16    Number of Visits 22    Date for PT Re-Evaluation 07/04/21    Authorization Type MVA: BCBS    PT Start Time 1103    PT Stop Time 1146    PT Time Calculation (min) 43 min    Activity Tolerance Patient tolerated treatment well    Behavior During Therapy WFL for tasks assessed/performed             Past Medical History:  Diagnosis Date   Cancer ([place])    endometreal ut   Diabetes mellitus    Edema of both lower extremities    GERD (gastroesophageal reflux disease)    Hypercholesterolemia    Hypertension    Ileus ([place])    Neuropathy of both feet    Plantar fasciitis    Left foot    Past Surgical History:  Procedure Laterality Date   HAND LIGAMENT RECONSTRUCTION     ROBOTIC ASSISTED TOTAL HYSTERECTOMY WITH BILATERAL SALPINGO OOPHERECTOMY N/A 06/20/2020   Procedure: XI ROBOTIC ASSISTED TOTAL HYSTERECTOMY WITH BILATERAL SALPINGO OOPHORECTOMY, RIGHT URETERAL LYSIS;  Surgeon: [name], MD;  Location: [facility];  Service: Gynecology;  Laterality: N/A;   SENTINEL NODE BIOPSY N/A 06/20/2020   Procedure: SENTINEL NODE BIOPSY;  Surgeon: [name], MD;  Location: [facility];  Service: Gynecology;  Laterality: N/A;    There were no vitals filed for this visit.   Subjective Assessment - 06/30/21 1104     Subjective It is very stressful to walk, it is hard to describe. Walking up here it was like an 8 but sitting down it's like a 1 that quick. The groin pain is not as bad unless I am stretching it-on Lt only.    Patient Stated Goals Decrease pain in order to walk more and cook without pain.  ironing, dishes                [facility] PT Assessment - 06/30/21 0001       Assessment   Medical Diagnosis Bilateral Low Back Pain and Left Hip Pain    Referring Provider (PT) Nelson Chimes PA-C   Dr Rolena Infante   Onset Date/Surgical Date 12/01/20    Hand Dominance Right      Sensation   Additional Comments twitching in Lt hand      Posture/Postural Control   Posture Comments WFL with severe pain in standing      PROM   Overall PROM Comments Lt hip flexion ROM full, no pain, soft end feel      Strength   Right Hip Flexion 5/5    Right Hip ABduction 5/5    Left Hip Flexion 5/5   discomfort in piriformis   Left Hip ABduction 5/5      Palpation   Palpation comment cont tightness in Lt piriformis easily released with manual treatment                           [facility] Adult PT Treatment/Exercise - 06/30/21 0001       Therapeutic Activites    Therapeutic Activities Other  Therapeutic Activities    Other Therapeutic Activities teaching husband piriformis release                     PT Education - 06/30/21 1254     Education Details anatomy of condition- spine for visual cues, POC & need for further treatment    Person(s) Educated Patient;Spouse    Methods Explanation    Comprehension Verbalized understanding              PT Short Term Goals - 06/30/21 1129       PT SHORT TERM GOAL #1   Title Patient will improve pain to 5/10 in low back when standing.    Baseline about 8/10 as of today    Status Not Met      PT SHORT TERM GOAL #2   Title Patient will increase strength of right anf left hip flexion to 4-/5.    Status Achieved      PT SHORT TERM GOAL #3   Title Patient will show increase lumbar range of motion to 50% of normal limits.    Baseline ROM WFL    Status Achieved      PT SHORT TERM GOAL #4   Title Patient will have understanding of inital HEP in order to complete at home.    Status Achieved               PT Long  Term Goals - 06/30/21 1131       PT LONG TERM GOAL #1   Title Patient will report increase strength in bilateral hip strength to 4/5 in order to stand for more than 45 mins to cook a meal with less breaks.    Baseline 4/5 strength, sometimes I can lean over on [place] to reduce pain and return to cooking    Status Partially Met      PT LONG TERM GOAL #2   Title pt will demo at least 5s of steady SLS      PT LONG TERM GOAL #3   Title Patient will improve lumbar range of motion to 75% WFL in order to perform activites of daily living safely.    Status Achieved      PT LONG TERM GOAL #4   Title Patient will show full understanding of final HEP and aquatic HEP in order to perform independently.    Status Achieved                   Plan - 06/30/21 1254     Clinical Impression Statement Pt continues to have tension in piriformis and reports that addressing that gives her the most relief, even if it is short term, so I worked with her husband to educate on proper form and placement for STM which pt reported felt good. She has made a lot of progress with whiplash pain, hip pain, strength and posture but continues to have severe levels of pain in upright posture that still severely limits her ADLs. at this time I would like for her to see her MD or a neurologist to discuss revisiting imaging to see if there have been any changes since August. Due to lack of change in this aspect, I do not feel that continuing PT is the most appropriate course of action. We discussed this at length between myself, the patient and her husband and they agreed. Encouraged her to reach out with any further needs or questions.    PT Treatment/Interventions ADLs/Self  Care Home Management;Gait training;Stair training;Functional mobility training;Therapeutic activities;Therapeutic exercise;Balance training;Neuromuscular re-education;Aquatic Therapy;Electrical Stimulation;Patient/family education;Manual  techniques;Passive range of motion;Dry needling;Energy conservation;Joint Manipulations;Traction;Spinal Manipulations    PT Home Exercise Plan [id number]    Consulted and Agree with Plan of Care Patient;Family member/caregiver             Patient will benefit from skilled therapeutic intervention in order to improve the following deficits and impairments:  Abnormal gait, Decreased coordination, Decreased range of motion, Difficulty walking, Pain, Decreased activity tolerance, Decreased balance, Decreased endurance, Decreased strength, Postural dysfunction, Improper body mechanics, Increased fascial restricitons, Increased muscle spasms, Impaired perceived functional ability  Visit Diagnosis: Abnormal posture  Other abnormalities of gait and mobility     Problem List Patient Active Problem List   Diagnosis Date Noted   Low HDL (under 40) 06/19/2021   High serum thyroid stimulating hormone (TSH) 06/19/2021   Vitamin D deficiency 06/19/2021   Leukopenia 06/19/2021   Sickle cell trait ([place]) 06/19/2021   Uterine cancer ([place]) 06/19/2021   Lumbar radiculopathy 05/07/2021   Bacteremia 06/28/2020   Hypothyroidism    Gastroesophageal reflux disease    Neuropathy    Pelvic fluid collection    Type 2 diabetes mellitus with hyperlipidemia ([place])    SIRS due to infectious process with acute organ dysfunction ([place]) 06/24/2020   SIRS (systemic inflammatory response syndrome) ([place]) 06/24/2020   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 Ha Placeres PT, DPT 06/30/21 12:59 PM   [facility] [place], [place], [address] Phone: [phone]   Fax:  [phone]  Name: Christy Carpenter MRN: [id number] Date of Birth: [birth year]

## 2021-07-02 ENCOUNTER — Other Ambulatory Visit: Payer: Self-pay

## 2021-07-02 ENCOUNTER — Ambulatory Visit (INDEPENDENT_AMBULATORY_CARE_PROVIDER_SITE_OTHER): Payer: BC Managed Care – PPO | Admitting: Bariatrics

## 2021-07-02 ENCOUNTER — Encounter (INDEPENDENT_AMBULATORY_CARE_PROVIDER_SITE_OTHER): Payer: Self-pay | Admitting: Bariatrics

## 2021-07-02 ENCOUNTER — Ambulatory Visit (HOSPITAL_BASED_OUTPATIENT_CLINIC_OR_DEPARTMENT_OTHER): Payer: BC Managed Care – PPO | Admitting: Physical Therapy

## 2021-07-02 VITALS — BP 111/72 | HR 88 | Temp 98.1°F | Ht 60.0 in | Wt 210.0 lb

## 2021-07-02 DIAGNOSIS — E786 Lipoprotein deficiency: Secondary | ICD-10-CM

## 2021-07-02 DIAGNOSIS — E1169 Type 2 diabetes mellitus with other specified complication: Secondary | ICD-10-CM

## 2021-07-02 DIAGNOSIS — Z6841 Body Mass Index (BMI) 40.0 and over, adult: Secondary | ICD-10-CM

## 2021-07-02 DIAGNOSIS — E559 Vitamin D deficiency, unspecified: Secondary | ICD-10-CM | POA: Diagnosis not present

## 2021-07-02 DIAGNOSIS — F5089 Other specified eating disorder: Secondary | ICD-10-CM

## 2021-07-02 DIAGNOSIS — E669 Obesity, unspecified: Secondary | ICD-10-CM

## 2021-07-02 MED ORDER — BUPROPION HCL ER (SR) 150 MG PO TB12: 150.0000 mg | ORAL_TABLET | Freq: Every day | ORAL | 0 refills | Status: DC

## 2021-07-02 MED ORDER — VITAMIN D (ERGOCALCIFEROL) 1.25 MG (50000 UNIT) PO CAPS: 50000.0000 [IU] | ORAL_CAPSULE | ORAL | 0 refills | Status: DC

## 2021-07-02 NOTE — Progress Notes (Signed)
Chief Complaint:   OBESITY Christy Carpenter is here to discuss her progress with her obesity treatment plan along with follow-up of her obesity related diagnoses. Christy Carpenter is on the Category 2 Plan and states she is following her eating plan approximately 60% of the time. Christy Carpenter states she is doing 0 minutes 0 times per week.  Today's visit was #:3 Starting weight: 207 lbs Starting date: 06/18/2021 Today's weight: 210 lbs Today's date: 07/02/2021 Total lbs lost to date: 0 Total lbs lost since last in-office visit: 0  Interim History: Christy Carpenter is up 3 lbs after her 1st visit. She stayed with the plan the first week and then got more difficult. She has cravings for carbohydrates at night.   Subjective:   1. Vitamin D deficiency Christy Carpenter started taking prescription Vitamin D. Her last Vitamin D level was 14.6.  2. Low HDL (under 40) Christy Carpenter's HDL level was 22.  3. Type 2 diabetes mellitus with obesity (Christy Carpenter) Christy Carpenter is taking Novolin N, Novolin R and Christy Carpenter. Her fasting blood glucose was in the range of 100-110. She has had some lows in the middle of the night down to the 60's. Her last A1C level was 8.9. Her Insulin level was 319.0  4. Other disorder of eating Christy Carpenter notes stress eating.  Assessment/Plan:   1. Vitamin D deficiency Low Vitamin D level contributes to fatigue and are associated with obesity, breast, and colon cancer. We will refill prescription Vitamin D 50,000 IU every week for 1 month with no refills and Christy Carpenter will follow-up for routine testing of Vitamin D, at least 2-3 times per year to avoid over-replacement.  - Vitamin D, Ergocalciferol, (DRISDOL) 1.25 MG (50000 UNIT) CAPS capsule; Take 1 capsule (50,000 Units total) by mouth every 7 (seven) days.  Dispense: 4 capsule; Refill: 0  2. Low HDL (under 40) Cardiovascular risk and specific lipid/LDL goals reviewed.  We discussed several lifestyle modifications today and Christy Carpenter will increase chair exercise. She will decrease carbohydrates. Orders  and follow up as documented in patient record.   Counseling Intensive lifestyle modifications are the first line treatment for this issue. Dietary changes: Increase soluble fiber. Decrease simple carbohydrates. Exercise changes: Moderate to vigorous-intensity aerobic activity 150 minutes per week if tolerated. Lipid-lowering medications: see documented in medical record.   3. Type 2 diabetes mellitus with obesity (Christy Carpenter) Christy Carpenter will continue medications. She plans to cut carbohydrates more and more.  She will decrease Insulin. She takes Christy Carpenter and Insulin. She will decrease to 65 units of regular insulin at night which is doun for 75 units of regular at night and rest of regime will remain the same.  Good blood sugar control is important to decrease the likelihood of diabetic complications such as nephropathy, neuropathy, limb loss, blindness, coronary artery disease, and death. Intensive lifestyle modification including diet, exercise and weight loss are the first line of treatment for diabetes.    4. Other disorder of eating Behavior modification techniques were discussed today to help Christy Carpenter deal with her emotional/non-hunger eating behaviors.  We will refill Wellbutrin 150 mg daily for 1 month with no refills. Orders and follow up as documented in patient record.    - buPROPion (WELLBUTRIN SR) 150 MG 12 hr tablet; Take 1 tablet (150 mg total) by mouth daily.  Dispense: 30 tablet; Refill: 0  6. Obesity, current BMI 41.1 Christy Carpenter is currently in the action stage of change. As such, her goal is to continue with weight loss efforts. She has agreed to the  Category 2 Plan.   Christy Carpenter will continue with the plan. We reviewed labs from 06/18/2021 CMP, Lipid, Vitamin D, A1C Insulin and Thyroid panel.  Exercise goals: Christy Carpenter is not currently doing exercise. She has a bulging disc L5-S1 per patient. I suggested  chair exercise from YouTube.   Behavioral modification strategies: increasing lean protein intake,  decreasing simple carbohydrates, increasing vegetables, increasing water intake, decreasing eating out, no skipping meals, meal planning and cooking strategies, keeping healthy foods in the home, and planning for success.  Christy Carpenter has agreed to follow-up with our clinic in 2 weeks. She was informed of the importance of frequent follow-up visits to maximize her success with intensive lifestyle modifications for her multiple health conditions.   Objective:   Blood pressure 111/72, pulse 88, temperature 98.1 F (36.7 C), height 5' (1.524 m), weight 210 lb (95.3 kg), SpO2 96 %. Body mass index is 41.01 kg/m.  General: Cooperative, alert, well developed, in no acute distress. HEENT: Conjunctivae and lids unremarkable. Cardiovascular: Regular rhythm.  Lungs: Normal work of breathing. Neurologic: No focal deficits.   Lab Results  Component Value Date   CREATININE 1.31 (H) 06/15/2021   BUN 39 (H) 06/15/2021   NA 138 06/15/2021   K 4.2 06/15/2021   CL 101 06/15/2021   CO2 29 06/15/2021   Lab Results  Component Value Date   ALT 61 (H) 06/15/2021   AST 137 (H) 06/15/2021   ALKPHOS 64 06/15/2021   BILITOT 0.3 06/15/2021   Lab Results  Component Value Date   HGBA1C 8.9 (H) 06/18/2021   HGBA1C 8.2 (H) 06/11/2020   HGBA1C 11.1 (H) 05/02/2020   HGBA1C 10.7 (H) 02/23/2020   Lab Results  Component Value Date   INSULIN 319.0 (H) 06/18/2021   Lab Results  Component Value Date   TSH 4.900 (H) 06/18/2021   Lab Results  Component Value Date   CHOL 135 06/18/2021   HDL 22 (L) 06/18/2021   LDLCALC 79 06/18/2021   TRIG 202 (H) 06/18/2021   CHOLHDL 9.0 08/20/2017   Lab Results  Component Value Date   VD25OH 14.6 (L) 06/18/2021   Lab Results  Component Value Date   WBC 5.9 06/15/2021   HGB 10.6 (L) 06/15/2021   HCT 31.5 (L) 06/15/2021   MCV 85.4 06/15/2021   PLT 212 06/15/2021   No results found for: IRON, TIBC, FERRITIN  Attestation Statements:   Reviewed by clinician on day  of visit: allergies, medications, problem list, medical history, surgical history, family history, social history, and previous encounter notes.   I, Lizbeth Bark, RMA, am acting as Location manager for [employer], DO.   I have reviewed the above documentation for accuracy and completeness, and I agree with the above. Jearld Lesch, DO

## 2021-07-14 ENCOUNTER — Encounter (INDEPENDENT_AMBULATORY_CARE_PROVIDER_SITE_OTHER): Payer: Self-pay

## 2021-07-15 ENCOUNTER — Ambulatory Visit (INDEPENDENT_AMBULATORY_CARE_PROVIDER_SITE_OTHER): Payer: BC Managed Care – PPO | Admitting: Bariatrics

## 2021-07-15 ENCOUNTER — Encounter (INDEPENDENT_AMBULATORY_CARE_PROVIDER_SITE_OTHER): Payer: Self-pay | Admitting: Bariatrics

## 2021-07-15 ENCOUNTER — Other Ambulatory Visit: Payer: Self-pay

## 2021-07-15 VITALS — BP 145/82 | HR 95 | Temp 98.0°F | Ht 60.0 in | Wt 206.0 lb

## 2021-07-15 DIAGNOSIS — E559 Vitamin D deficiency, unspecified: Secondary | ICD-10-CM | POA: Diagnosis not present

## 2021-07-15 DIAGNOSIS — Z6841 Body Mass Index (BMI) 40.0 and over, adult: Secondary | ICD-10-CM

## 2021-07-15 DIAGNOSIS — F5089 Other specified eating disorder: Secondary | ICD-10-CM | POA: Diagnosis not present

## 2021-07-15 DIAGNOSIS — E1169 Type 2 diabetes mellitus with other specified complication: Secondary | ICD-10-CM

## 2021-07-15 DIAGNOSIS — E669 Obesity, unspecified: Secondary | ICD-10-CM

## 2021-07-15 MED ORDER — VITAMIN D (ERGOCALCIFEROL) 1.25 MG (50000 UNIT) PO CAPS: 50000.0000 [IU] | ORAL_CAPSULE | ORAL | 0 refills | Status: DC

## 2021-07-15 MED ORDER — BUPROPION HCL ER (SR) 150 MG PO TB12: 150.0000 mg | ORAL_TABLET | Freq: Every day | ORAL | 0 refills | Status: DC

## 2021-07-16 NOTE — Progress Notes (Signed)
Chief Complaint:   OBESITY Christy Carpenter is here to discuss her progress with her obesity treatment plan along with follow-up of her obesity related diagnoses. Christy Carpenter is on the Category 2 Plan and states she is following her eating plan approximately 50% of the time. Christy Carpenter states she is doing 0 minutes 0 times per week.  Today's visit was #: 4 Starting weight: 207 lbs Starting date: 06/18/2021 Today's weight: 206 lbs Today's date: 07/15/2021 Total lbs lost to date: 1 lb Total lbs lost since last in-office visit: 4 lbs  Interim History: Christy Carpenter is down 4 lbs since her last visit.  Subjective:   1. Vitamin D deficiency Christy Carpenter is taking her medications as directed.  2. Type 2 diabetes mellitus with obesity ([place]) Christy Carpenter is currently taking Novolin R and Janumet XR.She will cut her night insulin by 1/2 dose. Her fasting blood sugar was in the range of 80-100. Her 2 hr Post Prandial was in the range of 170-180.  3. Other disorder of eating Christy Carpenter is taking Wellbutrin currently and she states it is helping with appetite suppression.  Assessment/Plan:   1. Vitamin D deficiency Low Vitamin D level contributes to fatigue and are associated with obesity, breast, and colon cancer. We will refill prescription Vitamin D 50,000 IU every week for 1 month with no refills and Christy Carpenter will follow-up for routine testing of Vitamin D, at least 2-3 times per year to avoid over-replacement.  - Vitamin D, Ergocalciferol, (DRISDOL) 1.25 MG (50000 UNIT) CAPS capsule; Take 1 capsule (50,000 Units total) by mouth every 7 (seven) days.  Dispense: 4 capsule; Refill: 0  2. Type 2 diabetes mellitus with obesity ([facility]) Christy Carpenter will continue medications. She will decrease portion sizes. Good blood sugar control is important to decrease the likelihood of diabetic complications such as nephropathy, neuropathy, limb loss, blindness, coronary artery disease, and death. Intensive lifestyle modification including diet, exercise and weight  loss are the first line of treatment for diabetes.   3. Other disorder of eating Behavior modification techniques were discussed today to help Christy Carpenter deal with her emotional/non-hunger eating behaviors.  Christy Carpenter agrees to increase dose of Wellbutrin from 150 mg to 200 mg. We will refill Wellbutrin 200 mg for 1 month with no refills. Orders and follow up as documented in patient record.    - buPROPion (WELLBUTRIN SR) 150 MG 12 hr tablet; Take 1 tablet (150 mg total) by mouth daily.  Dispense: 30 tablet; Refill: 0  4. Obesity, current BMI 40.3 Christy Carpenter is currently in the action stage of change. As such, her goal is to continue with weight loss efforts. She has agreed to the Category 2 Plan.   Christy Carpenter will continue meal planning and she will continue intentional eating.  Exercise goals: No exercise has been prescribed at this time.  Behavioral modification strategies: increasing lean protein intake, decreasing simple carbohydrates, increasing vegetables, increasing water intake, decreasing eating out, no skipping meals, meal planning and cooking strategies, keeping healthy foods in the home, and planning for success.  Christy Carpenter has agreed to follow-up with our clinic in 2-3 weeks with Mina Marble, NP or [facility], FNP and 6 weeks with myself. She was informed of the importance of frequent follow-up visits to maximize her success with intensive lifestyle modifications for her multiple health conditions.   Objective:   Blood pressure (!) 145/82, pulse 95, temperature 98 F (36.7 C), height 5' (1.524 m), weight 206 lb (93.4 kg), SpO2 97 %. Body mass index is 40.23 kg/m.  General: Cooperative, alert, well developed, in no acute distress. HEENT: Conjunctivae and lids unremarkable. Cardiovascular: Regular rhythm.  Lungs: Normal work of breathing. Neurologic: No focal deficits.   Lab Results  Component Value Date   CREATININE 1.31 (H) 06/15/2021   BUN 39 (H) 06/15/2021   NA 138 06/15/2021   K 4.2  06/15/2021   CL 101 06/15/2021   CO2 29 06/15/2021   Lab Results  Component Value Date   ALT 61 (H) 06/15/2021   AST 137 (H) 06/15/2021   ALKPHOS 64 06/15/2021   BILITOT 0.3 06/15/2021   Lab Results  Component Value Date   HGBA1C 8.9 (H) 06/18/2021   HGBA1C 8.2 (H) 06/11/2020   HGBA1C 11.1 (H) 05/02/2020   HGBA1C 10.7 (H) 02/23/2020   Lab Results  Component Value Date   INSULIN 319.0 (H) 06/18/2021   Lab Results  Component Value Date   TSH 4.900 (H) 06/18/2021   Lab Results  Component Value Date   CHOL 135 06/18/2021   HDL 22 (L) 06/18/2021   LDLCALC 79 06/18/2021   TRIG 202 (H) 06/18/2021   CHOLHDL 9.0 08/20/2017   Lab Results  Component Value Date   VD25OH 14.6 (L) 06/18/2021   Lab Results  Component Value Date   WBC 5.9 06/15/2021   HGB 10.6 (L) 06/15/2021   HCT 31.5 (L) 06/15/2021   MCV 85.4 06/15/2021   PLT 212 06/15/2021   No results found for: IRON, TIBC, FERRITIN  Attestation Statements:   Reviewed by clinician on day of visit: allergies, medications, problem list, medical history, surgical history, family history, social history, and previous encounter notes.  I, Lizbeth Bark, RMA, am acting as Location manager for [employer], DO.   I have reviewed the above documentation for accuracy and completeness, and I agree with the above. Jearld Lesch, DO

## 2021-07-28 ENCOUNTER — Encounter (HOSPITAL_COMMUNITY): Payer: Self-pay | Admitting: Emergency Medicine

## 2021-07-28 ENCOUNTER — Other Ambulatory Visit: Payer: Self-pay

## 2021-07-28 ENCOUNTER — Ambulatory Visit (HOSPITAL_COMMUNITY)
Admission: EM | Admit: 2021-07-28 | Discharge: 2021-07-28 | Disposition: A | Discharge status: Home / Self Care | Payer: BC Managed Care – PPO | Attending: Family Medicine | Admitting: Family Medicine

## 2021-07-28 ENCOUNTER — Ambulatory Visit (INDEPENDENT_AMBULATORY_CARE_PROVIDER_SITE_OTHER): Payer: BC Managed Care – PPO

## 2021-07-28 DIAGNOSIS — R062 Wheezing: Secondary | ICD-10-CM

## 2021-07-28 DIAGNOSIS — R410 Disorientation, unspecified: Secondary | ICD-10-CM | POA: Diagnosis not present

## 2021-07-28 DIAGNOSIS — R052 Subacute cough: Secondary | ICD-10-CM | POA: Diagnosis not present

## 2021-07-28 DIAGNOSIS — R059 Cough, unspecified: Secondary | ICD-10-CM

## 2021-07-28 DIAGNOSIS — R300 Dysuria: Secondary | ICD-10-CM | POA: Diagnosis not present

## 2021-07-28 DIAGNOSIS — R251 Tremor, unspecified: Secondary | ICD-10-CM | POA: Diagnosis not present

## 2021-07-28 LAB — POCT URINALYSIS DIPSTICK, ED / UC
Bilirubin Urine: NEGATIVE
Glucose, UA: NEGATIVE mg/dL
Hgb urine dipstick: NEGATIVE
Ketones, ur: NEGATIVE mg/dL
Leukocytes,Ua: NEGATIVE
Nitrite: NEGATIVE
Protein, ur: NEGATIVE mg/dL
Specific Gravity, Urine: 1.02 (ref 1.005–1.030)
Urobilinogen, UA: 0.2 mg/dL (ref 0.0–1.0)
pH: 5.5 (ref 5.0–8.0)

## 2021-07-28 MED ORDER — PREDNISONE 10 MG PO TABS: 20.0000 mg | ORAL_TABLET | Freq: Every day | ORAL | 0 refills | Status: AC

## 2021-07-28 MED ORDER — CEFUROXIME AXETIL 250 MG PO TABS: 250.0000 mg | ORAL_TABLET | Freq: Two times a day (BID) | ORAL | 0 refills | Status: DC

## 2021-07-28 MED ORDER — ALBUTEROL SULFATE HFA 108 (90 BASE) MCG/ACT IN AERS: 1.0000 | INHALATION_SPRAY | RESPIRATORY_TRACT | 0 refills | Status: DC | PRN

## 2021-07-28 NOTE — ED Provider Notes (Signed)
[facility]    CSN: [id number] Arrival date & time: 07/28/21  1136      History   Chief Complaint Chief Complaint  Patient presents with   Tremors   Nasal Congestion    HPI Christy Carpenter is a 65 y.o. female.   Patient is here for multiple issues;  Patient is chronically very unsteady on her feet;  She does have a low back issue, bulging disc, pinched nerve, which inhibits her from walking.  Today she is having a tremor in her arms/legs.  This has been with her for a while, at least 3-4 months.  She is dropping things, unable to make phone calls, hard to hold a toothbrush, cook.  She has talked with her pcp about the tremors;  the plan was for her to see neuro, but her tremors stopped before the visit.  However, those have restarted and she has not seen her pcp/neuro; .   Her SO thinks she is less "coherent" at times, esp last night.  This is the primary reason he brought her in today;  she just seemed a bit confused last night when up to use the bathroom. This morning she also seems less "responsive", seems more "asleep".   She does admit to feeling a bit more tired, but does not think she is confused;  he is afraid to leave her alone lately.  She has not taken any new medications.    This morning she woke up with some wheezing, new for her.  + cough;  no fevers;  no runny nose; + slight congestion;  + urinary frequency, but takes lasix; Slight dysuria at the end of the stream;  going on x week;    She was on an abx for uti, last taken last week.  She states her pcp did a urine, but not sure about those results;      Past Medical History:  Diagnosis Date   Cancer ([place])    endometreal ut   Diabetes mellitus    Edema of both lower extremities    GERD (gastroesophageal reflux disease)    Hypercholesterolemia    Hypertension    Ileus (HCC)    Neuropathy of both feet    Plantar fasciitis    Left foot    Patient Active Problem List   Diagnosis Date Noted   Low  HDL (under 40) 06/19/2021   High serum thyroid stimulating hormone (TSH) 06/19/2021   Vitamin D deficiency 06/19/2021   Leukopenia 06/19/2021   Sickle cell trait ([place]) 06/19/2021   Uterine cancer ([place]) 06/19/2021   Lumbar radiculopathy 05/07/2021   Bacteremia 06/28/2020   Hypothyroidism    Gastroesophageal reflux disease    Neuropathy    Pelvic fluid collection    Type 2 diabetes mellitus with hyperlipidemia (HCC)    SIRS due to infectious process with acute organ dysfunction (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   Endometriosis    Retroperitoneal fibrosis    Diabetic neuropathy ([place]) 02/23/2020   Endometrial cancer ([place]) 02/23/2020   Obesity (BMI 30.0-34.9) 02/23/2020   Chest pain 08/19/2017   Uncontrolled type 2 diabetes mellitus with hyperglycemia ([place]) 08/19/2017   Essential hypertension 08/19/2017   Hypercholesterolemia     Past Surgical History:  Procedure Laterality Date   HAND LIGAMENT RECONSTRUCTION     ROBOTIC ASSISTED TOTAL HYSTERECTOMY WITH BILATERAL SALPINGO OOPHERECTOMY N/A 06/20/2020   Procedure: XI ROBOTIC ASSISTED TOTAL HYSTERECTOMY WITH  BILATERAL SALPINGO OOPHORECTOMY, RIGHT URETERAL LYSIS;  Surgeon: [name], MD;  Location: [facility];  Service: Gynecology;  Laterality: N/A;   SENTINEL NODE BIOPSY N/A 06/20/2020   Procedure: SENTINEL NODE BIOPSY;  Surgeon: [name], MD;  Location: [facility];  Service: Gynecology;  Laterality: N/A;    OB History     Gravida  2   Para  2   Term      Preterm      AB      Living         SAB      IAB      Ectopic      Multiple      Live Births               Home Medications    Prior to Admission medications   Medication Sig Start Date End Date Taking? Authorizing Provider  amitriptyline (ELAVIL) 25 MG tablet Take 25 mg by mouth at bedtime.    [provider]  atorvastatin (LIPITOR) 40 MG tablet Take 1  tablet (40 mg total) by mouth daily at 6 PM. Patient taking differently: Take 40 mg by mouth daily. 08/20/17   Raiford Noble Latif, DO  buPROPion (WELLBUTRIN SR) 150 MG 12 hr tablet Take 1 tablet (150 mg total) by mouth daily. 07/15/21   Georgia Lopes, DO  Continuous Blood Gluc Sensor (FREESTYLE LIBRE 2 SENSOR) MISC  06/13/20   [provider]  fenofibrate micronized (LOFIBRA) 134 MG capsule Take 134 mg by mouth daily.    [provider]  levothyroxine (SYNTHROID) 50 MCG tablet Take 50 mcg by mouth daily before breakfast.    [provider]  losartan-hydrochlorothiazide (HYZAAR) 100-25 MG tablet Take 1 tablet by mouth daily.    [provider]  NOVOLIN N FLEXPEN 100 UNIT/ML Kiwkpen Inject 75 Units into the skin 3 (three) times daily.  04/17/20   [provider]  NOVOLIN R FLEXPEN 100 UNIT/ML SOPN Inject 75 Units into the skin 3 (three) times daily.  04/17/20   [provider]  SitaGLIPtin-MetFORMIN HCl (JANUMET XR) 50-1000 MG TB24 Take 1 tablet by mouth daily.    [provider]  Vitamin D, Ergocalciferol, (DRISDOL) 1.25 MG (50000 UNIT) CAPS capsule Take 1 capsule (50,000 Units total) by mouth every 7 (seven) days. 07/15/21   Georgia Lopes, DO    Family History Family History  Problem Relation Age of Onset   Diabetes Mellitus II Mother    Stroke Mother    Hypertension Mother    Kidney disease Mother    Stroke Brother    Diabetes Mellitus II Brother    Hypertension Brother    Cancer Paternal Aunt        ovarian    Social History Social History   Tobacco Use   Smoking status: Never   Smokeless tobacco: Never  Vaping Use   Vaping Use: Never used  Substance Use Topics   Alcohol use: No   Drug use: No     Allergies   Byetta 10 mcg pen [exenatide], Dapagliflozin, Januvia [sitagliptin], and Invokana [canagliflozin]   Review of Systems Review of Systems  Constitutional:  Positive for fatigue. Negative for fever.  HENT:   Negative for congestion, sinus pressure, sinus pain and sore throat.   Respiratory:  Positive for cough and wheezing.   Cardiovascular:  Negative for chest pain.  Gastrointestinal:  Negative for abdominal pain.  Genitourinary:  Positive for dysuria and frequency.  Neurological:  Positive for tremors and weakness. Negative for seizures, syncope and speech difficulty.  Psychiatric/Behavioral:  Positive for confusion.     Physical Exam Triage Vital Signs ED Triage Vitals  Enc Vitals Group     BP 07/28/21 1150 130/74     Pulse Rate 07/28/21 1150 95     Resp 07/28/21 1150 19     Temp 07/28/21 1150 97.8 F (36.6 C)     Temp Source 07/28/21 1150 Oral     SpO2 07/28/21 1150 98 %     Weight --      Height --      Head Circumference --      Peak Flow --      Pain Score 07/28/21 1148 0     Pain Loc --      Pain Edu? --      Excl. in [place]? --    No data found.  Updated Vital Signs BP 130/74 (BP Location: Right Arm)   Pulse 95   Temp 97.8 F (36.6 C) (Oral)   Resp 19   SpO2 98%     Physical Exam Constitutional:      General: She is not in acute distress.    Appearance: Normal appearance. She is not ill-appearing.  HENT:     Head: Normocephalic and atraumatic.     Nose: Nose normal.  Eyes:     Pupils: Pupils are equal, round, and reactive to light.  Cardiovascular:     Rate and Rhythm: Normal rate and regular rhythm.  Pulmonary:     Effort: Pulmonary effort is normal.     Breath sounds: Wheezing present.  Abdominal:     General: Abdomen is flat.     Palpations: Abdomen is soft.     Tenderness: There is no abdominal tenderness.  Musculoskeletal:     Cervical back: Normal range of motion and neck supple.  Neurological:     General: No focal deficit present.     Mental Status: She is alert. Mental status is at baseline.     Comments: Slight weakness to the LE bilaterally;  Slight weakness to the UE bilaterally;   Psychiatric:        Mood and Affect: Mood normal.         Behavior: Behavior normal.        Thought Content: Thought content normal.        Judgment: Judgment normal.     UC Treatments / Results  Labs (all labs ordered are listed, but only abnormal results are displayed) Labs Reviewed - No data to display  EKG   Radiology No results found.  Procedures Procedures (including critical care time)  Medications Ordered in UC Medications - No data to display  Initial Impression / Assessment and Plan / UC Course  I have reviewed the triage vital signs and the nursing notes.  Pertinent labs & imaging results that were available during my care of the patient were reviewed by me and considered in my medical decision making (see chart for details).    Patient with chronic tremor, now with increased confusion.  Chest xray and urine pending;  transfer care to another provider;  please see that note for further details Final Clinical Impressions(s) / UC Diagnoses   Final diagnoses:  None   Discharge Instructions   None    ED Prescriptions   None    PDMP not reviewed this encounter.   Rondel Oh, MD 07/29/21 [phone]

## 2021-07-28 NOTE — Discharge Instructions (Addendum)
Your chest xray was clear. I have sent in albuterol for the wheezing, and see instructions printed for how to use it. I also sent in low dose prednisone for inflammation in the lungs for 2 days; this can send your blood sugar higher; make sure you drink lots of fluids.  I have sent in cefuroxime as an antibiotic, esp for possible urine infection. The culture will tell us if that antibiotic should work, and our staff will call you if the medicine needs changing.  Return for feeling worse, not getting better.

## 2021-07-28 NOTE — ED Triage Notes (Signed)
Pt reports having tremors in hands and feet and off balance for couple months. Saw her PCP and referred her to a neurologist but hasnt seen them. Pt reports her Vit D was lower and gave her some to take. Pt thinks she might be dehydrated due to n/v for longer than 3 months. Unsure if she has talked to her PCP about the n/v. Reports that they did UA and showed E coli and took antibiotics course for that. Adds fatigue "for a good little minute now" reports about 3 months.  Was in [facility] in April and her back was injured causing bulging and pain esp with long standing or walking.   Pt has cough and congestion "more recently". Wheezing started yesterday.

## 2021-07-28 NOTE — ED Provider Notes (Signed)
[facility]    CSN: [id number] Arrival date & time: 07/28/21  1136      History   Chief Complaint Chief Complaint  Patient presents with   Tremors   Nasal Congestion    HPI Christy Carpenter is a 65 y.o. female.   HPI See other provider note from this day; I have assumed care of the pt at present. Other provider's note reviewed.  Has DM, and sugars lately have been in the 160-170s range.  Past Medical History:  Diagnosis Date   Cancer ([place])    endometreal ut   Diabetes mellitus    Edema of both lower extremities    GERD (gastroesophageal reflux disease)    Hypercholesterolemia    Hypertension    Ileus (HCC)    Neuropathy of both feet    Plantar fasciitis    Left foot    Patient Active Problem List   Diagnosis Date Noted   Low HDL (under 40) 06/19/2021   High serum thyroid stimulating hormone (TSH) 06/19/2021   Vitamin D deficiency 06/19/2021   Leukopenia 06/19/2021   Sickle cell trait ([place]) 06/19/2021   Uterine cancer ([place]) 06/19/2021   Lumbar radiculopathy 05/07/2021   Bacteremia 06/28/2020   Hypothyroidism    Gastroesophageal reflux disease    Neuropathy    Pelvic fluid collection    Type 2 diabetes mellitus with hyperlipidemia ([place])    SIRS due to infectious process with acute organ dysfunction ([place]) 06/24/2020   SIRS (systemic inflammatory response syndrome) ([place]) 06/24/2020   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   Endometriosis    Retroperitoneal fibrosis    Diabetic neuropathy ([place]) 02/23/2020   Endometrial cancer ([place]) 02/23/2020   Obesity (BMI 30.0-34.9) 02/23/2020   Chest pain 08/19/2017   Uncontrolled type 2 diabetes mellitus with hyperglycemia ([place]) 08/19/2017   Essential hypertension 08/19/2017   Hypercholesterolemia     Past Surgical History:  Procedure Laterality Date   HAND LIGAMENT RECONSTRUCTION     ROBOTIC ASSISTED TOTAL HYSTERECTOMY WITH BILATERAL SALPINGO OOPHERECTOMY N/A 06/20/2020   Procedure: XI ROBOTIC  ASSISTED TOTAL HYSTERECTOMY WITH BILATERAL SALPINGO OOPHORECTOMY, RIGHT URETERAL LYSIS;  Surgeon: [name], MD;  Location: [facility];  Service: Gynecology;  Laterality: N/A;   SENTINEL NODE BIOPSY N/A 06/20/2020   Procedure: SENTINEL NODE BIOPSY;  Surgeon: [name], MD;  Location: [facility];  Service: Gynecology;  Laterality: N/A;    OB History     Gravida  2   Para  2   Term      Preterm      AB      Living         SAB      IAB      Ectopic      Multiple      Live Births               Home Medications    Prior to Admission medications   Medication Sig Start Date End Date Taking? Authorizing Provider  albuterol (VENTOLIN HFA) 108 (90 Base) MCG/ACT inhaler Inhale 1-2 puffs into the lungs every 4 (four) hours as needed for wheezing or shortness of breath. 07/28/21  Yes Barrett Henle, MD  cefUROXime (CEFTIN) 250 MG tablet Take 1 tablet (250 mg total) by mouth 2 (two) times daily with a meal for 7 days. 07/28/21 08/04/21 Yes Kierre Hintz, Gwenlyn Perking, MD  predniSONE (DELTASONE) 10 MG tablet Take 2 tablets (20 mg total) by mouth daily for 2 days. 07/28/21 07/30/21 Yes  Barrett Henle, MD  amitriptyline (ELAVIL) 25 MG tablet Take 25 mg by mouth at bedtime.    [provider]  atorvastatin (LIPITOR) 40 MG tablet Take 1 tablet (40 mg total) by mouth daily at 6 PM. Patient taking differently: Take 40 mg by mouth daily. 08/20/17   Raiford Noble Latif, DO  buPROPion (WELLBUTRIN SR) 150 MG 12 hr tablet Take 1 tablet (150 mg total) by mouth daily. 07/15/21   Georgia Lopes, DO  Continuous Blood Gluc Sensor (FREESTYLE LIBRE 2 SENSOR) MISC  06/13/20   [provider]  fenofibrate micronized (LOFIBRA) 134 MG capsule Take 134 mg by mouth daily.    [provider]  levothyroxine (SYNTHROID) 50 MCG tablet Take 50 mcg by mouth daily before breakfast.    [provider]  losartan-hydrochlorothiazide (HYZAAR) 100-25 MG tablet Take 1 tablet by mouth  daily.    [provider]  NOVOLIN N FLEXPEN 100 UNIT/ML Kiwkpen Inject 75 Units into the skin 3 (three) times daily.  04/17/20   [provider]  NOVOLIN R FLEXPEN 100 UNIT/ML SOPN Inject 75 Units into the skin 3 (three) times daily.  04/17/20   [provider]  SitaGLIPtin-MetFORMIN HCl (JANUMET XR) 50-1000 MG TB24 Take 1 tablet by mouth daily.    [provider]  Vitamin D, Ergocalciferol, (DRISDOL) 1.25 MG (50000 UNIT) CAPS capsule Take 1 capsule (50,000 Units total) by mouth every 7 (seven) days. 07/15/21   Georgia Lopes, DO    Family History Family History  Problem Relation Age of Onset   Diabetes Mellitus II Mother    Stroke Mother    Hypertension Mother    Kidney disease Mother    Stroke Brother    Diabetes Mellitus II Brother    Hypertension Brother    Cancer Paternal Aunt        ovarian    Social History Social History   Tobacco Use   Smoking status: Never   Smokeless tobacco: Never  Vaping Use   Vaping Use: Never used  Substance Use Topics   Alcohol use: No   Drug use: No     Allergies   Byetta 10 mcg pen [exenatide], Dapagliflozin, Januvia [sitagliptin], and Invokana [canagliflozin]   Review of Systems Review of Systems   Physical Exam Triage Vital Signs ED Triage Vitals  Enc Vitals Group     BP 07/28/21 1150 130/74     Pulse Rate 07/28/21 1150 95     Resp 07/28/21 1150 19     Temp 07/28/21 1150 97.8 F (36.6 C)     Temp Source 07/28/21 1150 Oral     SpO2 07/28/21 1150 98 %     Weight --      Height --      Head Circumference --      Peak Flow --      Pain Score 07/28/21 1148 0     Pain Loc --      Pain Edu? --      Excl. in [place]? --    No data found.  Updated Vital Signs BP 130/74 (BP Location: Right Arm)   Pulse 95   Temp 97.8 F (36.6 C) (Oral)   Resp 19   SpO2 98%   Visual Acuity Right Eye Distance:   Left Eye Distance:   Bilateral Distance:    Right Eye Near:   Left Eye Near:    Bilateral  Near:     Physical Exam I have confirmed  history and exam that is in the other provider note.  UC Treatments / Results  Labs (all labs ordered are listed, but only abnormal results are displayed) Labs Reviewed  URINE CULTURE  POCT URINALYSIS DIPSTICK, ED / UC    EKG   Radiology DG Chest 2 View  Result Date: 07/28/2021 CLINICAL DATA:  Cough and wheezing for the past 3 months. EXAM: CHEST - 2 VIEW COMPARISON:  Chest x-ray dated June 15, 2021. FINDINGS: The heart size and mediastinal contours are within normal limits. Both lungs are clear. The visualized skeletal structures are unremarkable. IMPRESSION: No active cardiopulmonary disease. Electronically Signed   By: Titus Dubin M.D.   On: 07/28/2021 15:33    Procedures Procedures (including critical care time)  Medications Ordered in UC Medications - No data to display  Initial Impression / Assessment and Plan / UC Course  I have reviewed the triage vital signs and the nursing notes.  Pertinent labs & imaging results that were available during my care of the patient were reviewed by me and considered in my medical decision making (see chart for details).     UA clear today. She states recently she had E coli.  Chest xray is read as clear. She is audibly wheezing. Ceftin sent for poss UTI, albuterol inhaler as needed, and low dose prednisone for poss asthma exacerbation, since cxr clear. Warnings given for elevated sugars.   Final Clinical Impressions(s) / UC Diagnoses   Final diagnoses:  Wheezing  Subacute cough  Subacute confusional state  Dysuria  Tremor     Discharge Instructions      Your chest xray was clear. I have sent in albuterol for the wheezing, and see instructions printed for how to use it. I also sent in low dose prednisone for inflammation in the lungs for 2 days; this can send your blood sugar higher; make sure you drink lots of fluids.  I have sent in cefuroxime as an antibiotic, esp for  possible urine infection. The culture will tell us if that antibiotic should work, and our staff will call you if the medicine needs changing.  Return for feeling worse, not getting better.     ED Prescriptions     Medication Sig Dispense Auth. Provider   cefUROXime (CEFTIN) 250 MG tablet Take 1 tablet (250 mg total) by mouth 2 (two) times daily with a meal for 7 days. 14 tablet Elona Yinger, Gwenlyn Perking, MD   albuterol (VENTOLIN HFA) 108 (90 Base) MCG/ACT inhaler Inhale 1-2 puffs into the lungs every 4 (four) hours as needed for wheezing or shortness of breath. 1 each Barrett Henle, MD   predniSONE (DELTASONE) 10 MG tablet Take 2 tablets (20 mg total) by mouth daily for 2 days. 4 tablet Gissell Barra, Gwenlyn Perking, MD      PDMP not reviewed this encounter.   Barrett Henle, MD 07/28/21 [phone]

## 2021-07-30 ENCOUNTER — Telehealth (HOSPITAL_COMMUNITY): Payer: Self-pay | Admitting: Emergency Medicine

## 2021-07-30 LAB — URINE CULTURE: Culture: >=100000 — AB

## 2021-07-30 MED ORDER — CEFDINIR 300 MG PO CAPS: 300.0000 mg | ORAL_CAPSULE | Freq: Two times a day (BID) | ORAL | 0 refills | Status: AC

## 2021-08-05 ENCOUNTER — Other Ambulatory Visit: Payer: Self-pay

## 2021-08-05 ENCOUNTER — Telehealth (INDEPENDENT_AMBULATORY_CARE_PROVIDER_SITE_OTHER): Payer: BC Managed Care – PPO | Admitting: Adult Health

## 2021-08-05 ENCOUNTER — Encounter (INDEPENDENT_AMBULATORY_CARE_PROVIDER_SITE_OTHER): Payer: Self-pay | Admitting: Adult Health

## 2021-08-06 ENCOUNTER — Encounter (INDEPENDENT_AMBULATORY_CARE_PROVIDER_SITE_OTHER): Payer: Self-pay

## 2021-08-26 ENCOUNTER — Other Ambulatory Visit: Payer: Self-pay

## 2021-08-26 ENCOUNTER — Ambulatory Visit (INDEPENDENT_AMBULATORY_CARE_PROVIDER_SITE_OTHER): Payer: BC Managed Care – PPO | Admitting: Bariatrics

## 2021-08-26 ENCOUNTER — Encounter (INDEPENDENT_AMBULATORY_CARE_PROVIDER_SITE_OTHER): Payer: Self-pay | Admitting: Bariatrics

## 2021-08-26 VITALS — BP 114/81 | HR 96 | Temp 98.3°F | Ht 60.0 in | Wt 202.0 lb

## 2021-08-26 DIAGNOSIS — E559 Vitamin D deficiency, unspecified: Secondary | ICD-10-CM

## 2021-08-26 DIAGNOSIS — Z6839 Body mass index (BMI) 39.0-39.9, adult: Secondary | ICD-10-CM

## 2021-08-26 DIAGNOSIS — F5089 Other specified eating disorder: Secondary | ICD-10-CM

## 2021-08-26 MED ORDER — VITAMIN D (ERGOCALCIFEROL) 1.25 MG (50000 UNIT) PO CAPS: 50000.0000 [IU] | ORAL_CAPSULE | ORAL | 0 refills | Status: DC

## 2021-08-26 MED ORDER — BUPROPION HCL ER (SR) 200 MG PO TB12: 200.0000 mg | ORAL_TABLET | Freq: Every day | ORAL | 0 refills | Status: DC

## 2021-08-26 NOTE — Progress Notes (Signed)
Chief Complaint:   OBESITY Christy Carpenter is here to discuss her progress with her obesity treatment plan along with follow-up of her obesity related diagnoses. Christy Carpenter is on the Category 2 Plan and states she is following her eating plan approximately 60% of the time. Christy Carpenter states she is low impact video for 20 minutes 3 times per week.  Today's visit was #: 5 Starting weight: 207 lbs Starting date: 06/18/2021 Today's weight: 202 lbs Today's date: 08/26/2021 Total lbs lost to date: 5 lbs Total lbs lost since last in-office visit: 2 lbs  Interim History: Christy Carpenter is down an additional 2 lbs over the holidays. She has cut back on starches.   Subjective:   1. Vitamin D deficiency Christy Carpenter is taking Vitamin D currently.   2. Other disorder of eating Christy Carpenter states Wellbutrin helps with stress eating.   Assessment/Plan:   1. Vitamin D deficiency Low Vitamin D level contributes to fatigue and are associated with obesity, breast, and colon cancer. We will refill prescription Vitamin D 50,000 IU every week for 1 month with no refills and Christy Carpenter will follow-up for routine testing of Vitamin D, at least 2-3 times per year to avoid over-replacement.  - Vitamin D, Ergocalciferol, (DRISDOL) 1.25 MG (50000 UNIT) CAPS capsule; Take 1 capsule (50,000 Units total) by mouth every 7 (seven) days.  Dispense: 4 capsule; Refill: 0  2. Other disorder of eating We will refill Wellbutrin SR 150 mg for 1 month with no refills. Behavior modification techniques were discussed today to help Christy Carpenter deal with her emotional/non-hunger eating behaviors.  Orders and follow up as documented in patient record.    - buPROPion (WELLBUTRIN SR) 200 MG 12 hr tablet; Take 1 tablet (200 mg total) by mouth daily.  Dispense: 30 tablet; Refill: 0  3. Obesity, current BMI 39.45 Christy Carpenter is currently in the action stage of change. As such, her goal is to continue with weight loss efforts. She has agreed to the Category 2 Plan.   Christy Carpenter will continue  meal planning and she will continue intentional eating. She will follow the plan 80-90%. She will keep starchy low.  Exercise goals: As is.   Behavioral modification strategies: increasing lean protein intake, decreasing simple carbohydrates, increasing vegetables, increasing water intake, decreasing eating out, no skipping meals, meal planning and cooking strategies, keeping healthy foods in the home, and planning for success.  Christy Carpenter has agreed to follow-up with our clinic in 2 weeks. She was informed of the importance of frequent follow-up visits to maximize her success with intensive lifestyle modifications for her multiple health conditions.   Objective:   Blood pressure 114/81, pulse 96, temperature 98.3 F (36.8 C), height 5' (1.524 m), weight 202 lb (91.6 kg), SpO2 97 %. Body mass index is 39.45 kg/m.  General: Cooperative, alert, well developed, in no acute distress. HEENT: Conjunctivae and lids unremarkable. Cardiovascular: Regular rhythm.  Lungs: Normal work of breathing. Neurologic: No focal deficits.   Lab Results  Component Value Date   CREATININE 1.31 (H) 06/15/2021   BUN 39 (H) 06/15/2021   NA 138 06/15/2021   K 4.2 06/15/2021   CL 101 06/15/2021   CO2 29 06/15/2021   Lab Results  Component Value Date   ALT 61 (H) 06/15/2021   AST 137 (H) 06/15/2021   ALKPHOS 64 06/15/2021   BILITOT 0.3 06/15/2021   Lab Results  Component Value Date   HGBA1C 8.9 (H) 06/18/2021   HGBA1C 8.2 (H) 06/11/2020   HGBA1C 11.1 (H)  05/02/2020   HGBA1C 10.7 (H) 02/23/2020   Lab Results  Component Value Date   INSULIN 319.0 (H) 06/18/2021   Lab Results  Component Value Date   TSH 4.900 (H) 06/18/2021   Lab Results  Component Value Date   CHOL 135 06/18/2021   HDL 22 (L) 06/18/2021   LDLCALC 79 06/18/2021   TRIG 202 (H) 06/18/2021   CHOLHDL 9.0 08/20/2017   Lab Results  Component Value Date   VD25OH 14.6 (L) 06/18/2021   Lab Results  Component Value Date   WBC 5.9  06/15/2021   HGB 10.6 (L) 06/15/2021   HCT 31.5 (L) 06/15/2021   MCV 85.4 06/15/2021   PLT 212 06/15/2021   No results found for: IRON, TIBC, FERRITIN  Attestation Statements:   Reviewed by clinician on day of visit: allergies, medications, problem list, medical history, surgical history, family history, social history, and previous encounter notes.  I, Christy Carpenter, RMA, am acting as Location manager for [employer], DO.  I have reviewed the above documentation for accuracy and completeness, and I agree with the above. Jearld Lesch, DO

## 2021-08-31 ENCOUNTER — Encounter (INDEPENDENT_AMBULATORY_CARE_PROVIDER_SITE_OTHER): Payer: Self-pay | Admitting: Bariatrics

## 2021-09-10 ENCOUNTER — Ambulatory Visit (INDEPENDENT_AMBULATORY_CARE_PROVIDER_SITE_OTHER): Payer: BC Managed Care – PPO | Admitting: Bariatrics

## 2021-09-10 ENCOUNTER — Other Ambulatory Visit: Payer: Self-pay

## 2021-09-10 ENCOUNTER — Encounter (INDEPENDENT_AMBULATORY_CARE_PROVIDER_SITE_OTHER): Payer: Self-pay | Admitting: Bariatrics

## 2021-09-10 VITALS — BP 149/78 | HR 100 | Temp 98.3°F | Ht 60.0 in | Wt 199.0 lb

## 2021-09-10 DIAGNOSIS — E669 Obesity, unspecified: Secondary | ICD-10-CM | POA: Diagnosis not present

## 2021-09-10 DIAGNOSIS — Z6841 Body Mass Index (BMI) 40.0 and over, adult: Secondary | ICD-10-CM

## 2021-09-10 DIAGNOSIS — E1169 Type 2 diabetes mellitus with other specified complication: Secondary | ICD-10-CM | POA: Diagnosis not present

## 2021-09-10 DIAGNOSIS — Z6838 Body mass index (BMI) 38.0-38.9, adult: Secondary | ICD-10-CM

## 2021-09-10 DIAGNOSIS — E785 Hyperlipidemia, unspecified: Secondary | ICD-10-CM | POA: Diagnosis not present

## 2021-09-10 NOTE — Progress Notes (Signed)
Chief Complaint:   OBESITY Christy Carpenter is here to discuss her progress with her obesity treatment plan along with follow-up of her obesity related diagnoses. Christy Carpenter is on the Category 2 Plan and states she is following her eating plan approximately 60-70% of the time. Christy Carpenter states she is walking for 30 minutes 4 times per week.  Today's visit was #: 6 Starting weight: 207 lbs Starting date: 06/18/2021 Today's weight: 199 lbs Today's date: 09/10/2021 Total lbs lost to date: 8 lbs Total lbs lost since last in-office visit: 3 lbs  Interim History: Christy Carpenter is down an additional 3 lbs and doing okay overall.  Subjective:   1. Type 2 diabetes mellitus with obesity ([place]) Christy Carpenter is taking Novolin N and Janumet XR. Her fasting blood sugar was in the range of 140's.  2. Hyperlipidemia associated with type 2 diabetes mellitus ([place]) Christy Carpenter is currently taking Lipitor.   Assessment/Plan:   1. Type 2 diabetes mellitus with obesity ([facility]CC) Christy Carpenter will continue taking her medications. She will keep carbohydrates low. Good blood sugar control is important to decrease the likelihood of diabetic complications such as nephropathy, neuropathy, limb loss, blindness, coronary artery disease, and death. Intensive lifestyle modification including diet, exercise and weight loss are the first line of treatment for diabetes.   2. Hyperlipidemia associated with type 2 diabetes mellitus ([place]) Cardiovascular risk and specific lipid/LDL goals reviewed.  Christy Carpenter will continue taking Lipitor. We discussed several lifestyle modifications today and Christy Carpenter will continue to work on diet, exercise and weight loss efforts. Orders and follow up as documented in patient record.   Counseling Intensive lifestyle modifications are the first line treatment for this issue. Dietary changes: Increase soluble fiber. Decrease simple carbohydrates. Exercise changes: Moderate to vigorous-intensity aerobic activity 150 minutes per week if  tolerated. Lipid-lowering medications: see documented in medical record.  3. Obesity, current BMI 38.86 Christy Carpenter is currently in the action stage of change. As such, her goal is to continue with weight loss efforts. She has agreed to the Category 2 Plan.   Christy Carpenter will continue meal planning and she will adhere closely to the plan 80-90%. She will keep protein high. Recipes for high protein was provided today.   Exercise goals: As is. Christy Carpenter notes back pain.and is scheduled for radiofrequency ablation on 10/13/21 per patient.   Behavioral modification strategies: increasing lean protein intake, decreasing simple carbohydrates, increasing vegetables, increasing water intake, decreasing eating out, no skipping meals, meal planning and cooking strategies, keeping healthy foods in the home, and planning for success.  Christy Carpenter has agreed to follow-up with our clinic in 2-3 weeks. She was informed of the importance of frequent follow-up visits to maximize her success with intensive lifestyle modifications for her multiple health conditions.   Objective:   Pulse 100, temperature 98.3 F (36.8 C), height 5' (1.524 m), weight 199 lb (90.3 kg), SpO2 96 %. Body mass index is 38.86 kg/m.  General: Cooperative, alert, well developed, in no acute distress. HEENT: Conjunctivae and lids unremarkable. Cardiovascular: Regular rhythm.  Lungs: Normal work of breathing. Neurologic: No focal deficits.   Lab Results  Component Value Date   CREATININE 1.31 (H) 06/15/2021   BUN 39 (H) 06/15/2021   NA 138 06/15/2021   K 4.2 06/15/2021   CL 101 06/15/2021   CO2 29 06/15/2021   Lab Results  Component Value Date   ALT 61 (H) 06/15/2021   AST 137 (H) 06/15/2021   ALKPHOS 64 06/15/2021   BILITOT 0.3 06/15/2021  Lab Results  Component Value Date   HGBA1C 8.9 (H) 06/18/2021   HGBA1C 8.2 (H) 06/11/2020   HGBA1C 11.1 (H) 05/02/2020   HGBA1C 10.7 (H) 02/23/2020   Lab Results  Component Value Date   INSULIN 319.0  (H) 06/18/2021   Lab Results  Component Value Date   TSH 4.900 (H) 06/18/2021   Lab Results  Component Value Date   CHOL 135 06/18/2021   HDL 22 (L) 06/18/2021   LDLCALC 79 06/18/2021   TRIG 202 (H) 06/18/2021   CHOLHDL 9.0 08/20/2017   Lab Results  Component Value Date   VD25OH 14.6 (L) 06/18/2021   Lab Results  Component Value Date   WBC 5.9 06/15/2021   HGB 10.6 (L) 06/15/2021   HCT 31.5 (L) 06/15/2021   MCV 85.4 06/15/2021   PLT 212 06/15/2021   No results found for: IRON, TIBC, FERRITIN  Attestation Statements:   Reviewed by clinician on day of visit: allergies, medications, problem list, medical history, surgical history, family history, social history, and previous encounter notes.  I, Lizbeth Bark, RMA, am acting as Location manager for [employer], DO.  I have reviewed the above documentation for accuracy and completeness, and I agree with the above. Jearld Lesch, DO

## 2021-09-11 ENCOUNTER — Encounter (INDEPENDENT_AMBULATORY_CARE_PROVIDER_SITE_OTHER): Payer: Self-pay | Admitting: Bariatrics

## 2021-09-24 ENCOUNTER — Ambulatory Visit (INDEPENDENT_AMBULATORY_CARE_PROVIDER_SITE_OTHER): Payer: BC Managed Care – PPO | Admitting: Bariatrics

## 2021-09-24 ENCOUNTER — Other Ambulatory Visit: Payer: Self-pay

## 2021-09-24 ENCOUNTER — Encounter (INDEPENDENT_AMBULATORY_CARE_PROVIDER_SITE_OTHER): Payer: Self-pay | Admitting: Bariatrics

## 2021-09-24 VITALS — BP 147/81 | HR 99 | Temp 98.2°F | Ht 60.0 in | Wt 202.0 lb

## 2021-09-24 DIAGNOSIS — E559 Vitamin D deficiency, unspecified: Secondary | ICD-10-CM

## 2021-09-24 DIAGNOSIS — Z7984 Long term (current) use of oral hypoglycemic drugs: Secondary | ICD-10-CM

## 2021-09-24 DIAGNOSIS — E1169 Type 2 diabetes mellitus with other specified complication: Secondary | ICD-10-CM

## 2021-09-24 DIAGNOSIS — Z6839 Body mass index (BMI) 39.0-39.9, adult: Secondary | ICD-10-CM

## 2021-09-24 DIAGNOSIS — F5089 Other specified eating disorder: Secondary | ICD-10-CM

## 2021-09-24 DIAGNOSIS — E669 Obesity, unspecified: Secondary | ICD-10-CM

## 2021-09-24 MED ORDER — BUPROPION HCL ER (SR) 200 MG PO TB12: 200.0000 mg | ORAL_TABLET | Freq: Every day | ORAL | 0 refills | Status: DC

## 2021-09-24 MED ORDER — VITAMIN D (ERGOCALCIFEROL) 1.25 MG (50000 UNIT) PO CAPS: 50000.0000 [IU] | ORAL_CAPSULE | ORAL | 0 refills | Status: DC

## 2021-09-25 ENCOUNTER — Encounter (INDEPENDENT_AMBULATORY_CARE_PROVIDER_SITE_OTHER): Payer: Self-pay | Admitting: Bariatrics

## 2021-09-25 NOTE — Progress Notes (Signed)
Chief Complaint:   OBESITY Christy Carpenter is here to discuss her progress with her obesity treatment plan along with follow-up of her obesity related diagnoses. Christy Carpenter is on the Category 2 Plan and states she is following her eating plan approximately 60% of the time. Christy Carpenter states she is doing 0 minutes 0 times per week.  Today's visit was #: 7 Starting weight: 207 lbs Starting date: 06/18/2021 Today's weight: 202 lbs Today's date: 09/24/2021 Total lbs lost to date: 5 lbs Total lbs lost since last in-office visit: 0  Interim History: Christy Carpenter is up 3 lbs since her last visit. She is not eating any additional carbohydrates.   Subjective:   1. Vitamin D deficiency [place] is taking her medications as directed.  2. Type 2 diabetes mellitus with obesity ([place]) Christy Carpenter is taking Novolin N, Novolin R and Janumet. She has Ozempic but she is not taking it. Her last A1C was 9.0 (per patient).  3. Other disorder of eating Christy Carpenter notes Wellbutrin is helping with stress eating.  Assessment/Plan:   1. Vitamin D deficiency Low Vitamin D level contributes to fatigue and are associated with obesity, breast, and colon cancer. We will refill prescription Vitamin D 50,000 IU every week for 1 month with no refills and Christy Carpenter will follow-up for routine testing of Vitamin D, at least 2-3 times per year to avoid over-replacement.  - Vitamin D, Ergocalciferol, (DRISDOL) 1.25 MG (50000 UNIT) CAPS capsule; Take 1 capsule (50,000 Units total) by mouth every 7 (seven) days.  Dispense: 4 capsule; Refill: 0  2. Type 2 diabetes mellitus with obesity (HCC) Christy Carpenter will continue her medications. She will start Ozempic (helps with appetite and weight). Good blood sugar control is important to decrease the likelihood of diabetic complications such as nephropathy, neuropathy, limb loss, blindness, coronary artery disease, and death. Intensive lifestyle modification including diet, exercise and weight loss are the first line of treatment for  diabetes.   3. Other disorder of eating We will refill Welbutrin SR 200 mg for 1 month with no refills. Behavior modification techniques were discussed today to help Christy Carpenter deal with her emotional/non-hunger eating behaviors.  Orders and follow up as documented in patient record.   - buPROPion (WELLBUTRIN SR) 200 MG 12 hr tablet; Take 1 tablet (200 mg total) by mouth daily.  Dispense: 30 tablet; Refill: 0  4. Obesity, current BMI 39.6 Christy Carpenter is currently in the action stage of change. As such, her goal is to continue with weight loss efforts. She has agreed to the Category 2 Plan.  Christy Carpenter will adhere closely to the plan 80-90%.   Exercise goals: No exercise has been prescribed at this time.  Behavioral modification strategies: increasing lean protein intake, decreasing simple carbohydrates, increasing vegetables, increasing water intake, decreasing eating out, no skipping meals, meal planning and cooking strategies, keeping healthy foods in the home, and planning for success.  Christy Carpenter has agreed to follow-up with our clinic in 2-3 weeks. She was informed of the importance of frequent follow-up visits to maximize her success with intensive lifestyle modifications for her multiple health conditions.   Objective:   Blood pressure (!) 147/81, pulse 99, temperature 98.2 F (36.8 C), height 5' (1.524 m), weight 202 lb (91.6 kg), SpO2 97 %. Body mass index is 39.45 kg/m.  General: Cooperative, alert, well developed, in no acute distress. HEENT: Conjunctivae and lids unremarkable. Cardiovascular: Regular rhythm.  Lungs: Normal work of breathing. Neurologic: No focal deficits.   Lab Results  Component Value Date  CREATININE 1.31 (H) 06/15/2021   BUN 39 (H) 06/15/2021   NA 138 06/15/2021   K 4.2 06/15/2021   CL 101 06/15/2021   CO2 29 06/15/2021   Lab Results  Component Value Date   ALT 61 (H) 06/15/2021   AST 137 (H) 06/15/2021   ALKPHOS 64 06/15/2021   BILITOT 0.3 06/15/2021   Lab  Results  Component Value Date   HGBA1C 8.9 (H) 06/18/2021   HGBA1C 8.2 (H) 06/11/2020   HGBA1C 11.1 (H) 05/02/2020   HGBA1C 10.7 (H) 02/23/2020   Lab Results  Component Value Date   INSULIN 319.0 (H) 06/18/2021   Lab Results  Component Value Date   TSH 4.900 (H) 06/18/2021   Lab Results  Component Value Date   CHOL 135 06/18/2021   HDL 22 (L) 06/18/2021   LDLCALC 79 06/18/2021   TRIG 202 (H) 06/18/2021   CHOLHDL 9.0 08/20/2017   Lab Results  Component Value Date   VD25OH 14.6 (L) 06/18/2021   Lab Results  Component Value Date   WBC 5.9 06/15/2021   HGB 10.6 (L) 06/15/2021   HCT 31.5 (L) 06/15/2021   MCV 85.4 06/15/2021   PLT 212 06/15/2021   No results found for: IRON, TIBC, FERRITIN  Attestation Statements:   Reviewed by clinician on day of visit: allergies, medications, problem list, medical history, surgical history, family history, social history, and previous encounter notes.  I, Lizbeth Bark, RMA, am acting as Location manager for [employer], DO.  I have reviewed the above documentation for accuracy and completeness, and I agree with the above. Jearld Lesch, DO

## 2021-10-25 IMAGING — CT CT ANGIO CHEST
2 of 6 series · 12 of 46 positions shown · IV contrast (omnipaque)
Comparison: CT chest 08/19/2017 pelvic ultrasound 04/10/2020

CLINICAL DATA: Abdominal pain, fever post operatively, recent
robotic TAH BSO 06/20/2020

EXAM:
CT ANGIOGRAPHY CHEST
CT ABDOMEN AND PELVIS WITH CONTRAST
TECHNIQUE: Multidetector CT imaging of the chest was performed using the
standard protocol during bolus administration of intravenous
contrast. Multiplanar CT image reconstructions and MIPs were
obtained to evaluate the vascular anatomy. Multidetector CT imaging
of the abdomen and pelvis was performed using the standard protocol
during bolus administration of intravenous contrast.
CONTRAST:  100mL OMNIPAQUE IOHEXOL 350 MG/ML SOLN

[Series 3: thins · axial · 0.83mm/px · z∈[-261,-69]mm · 9 of 234 slices shown]
[im 21/234  lung]
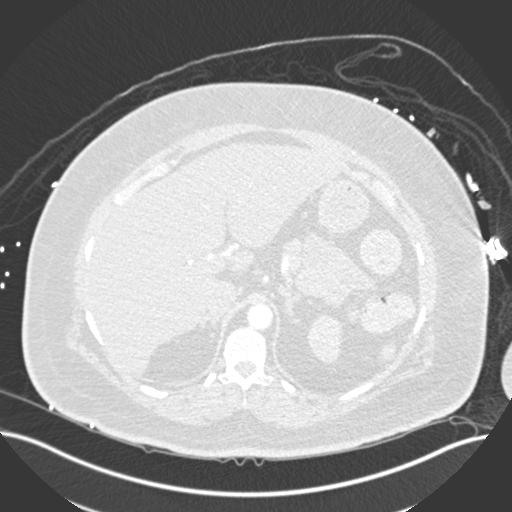
[im 41/234  soft-tissue]
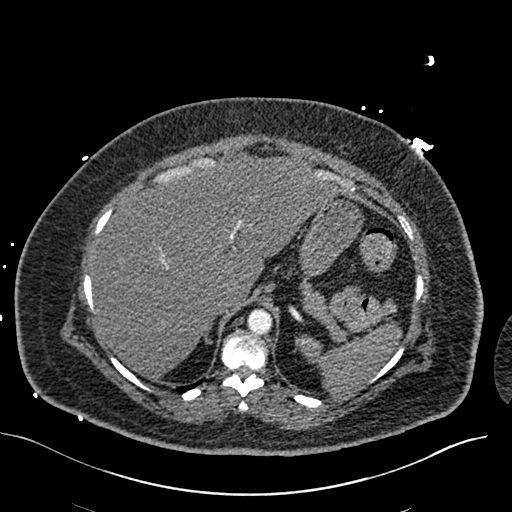
[im 71/234  lung]
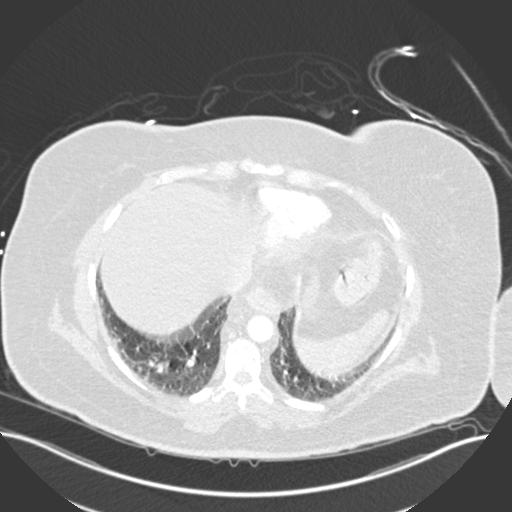
[im 92/234  soft-tissue]
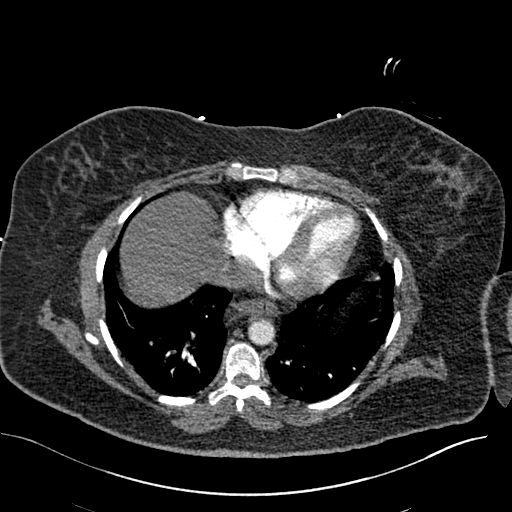
[im 122/234  lung]
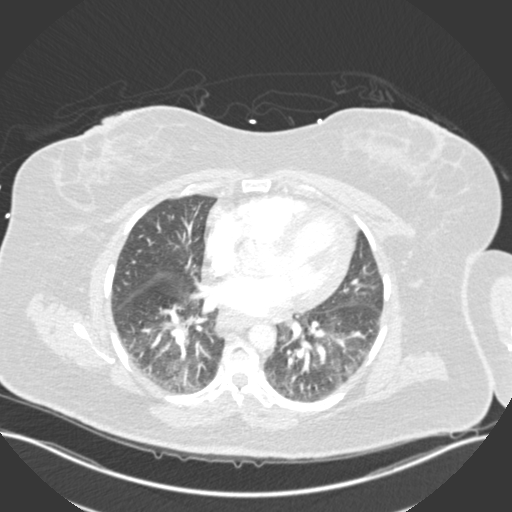
[im 142/234  soft-tissue]
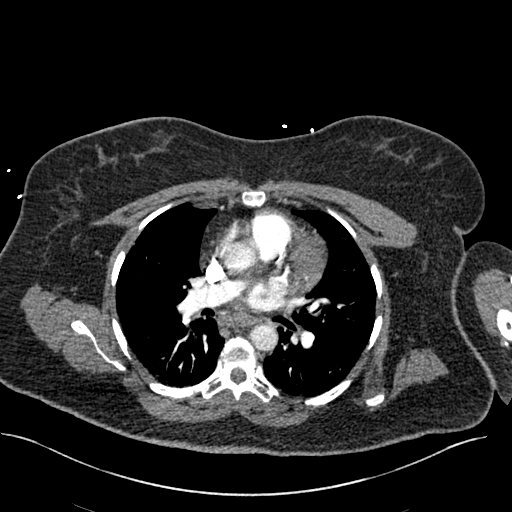
[im 163/234  lung]
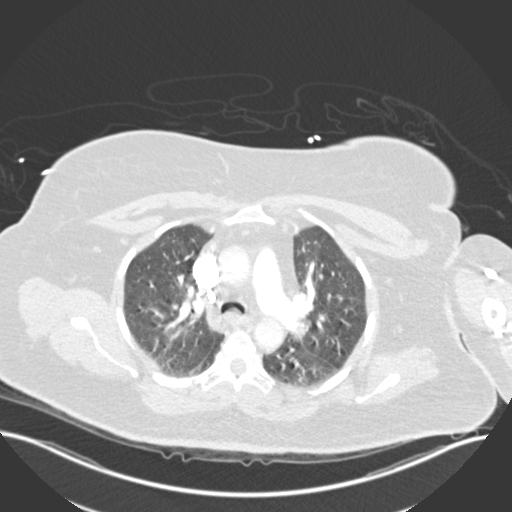
[im 193/234  soft-tissue]
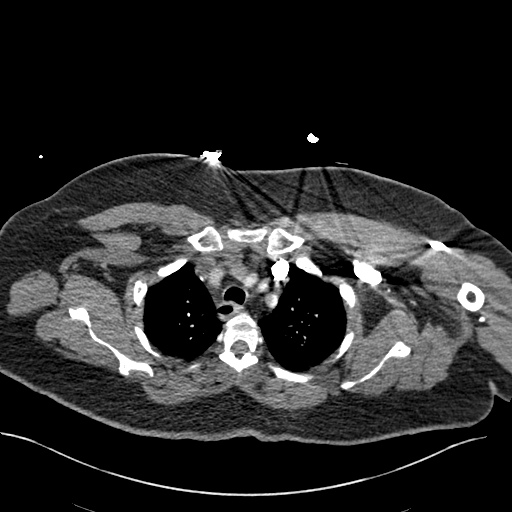
[im 213/234  lung]
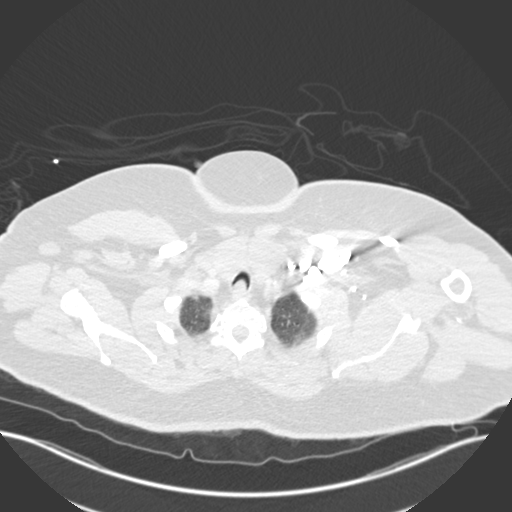

[Series 5: coronal mpr · coronal · 0.46mm/px · 3 of 168 slices shown]
[im 42/168  soft-tissue]
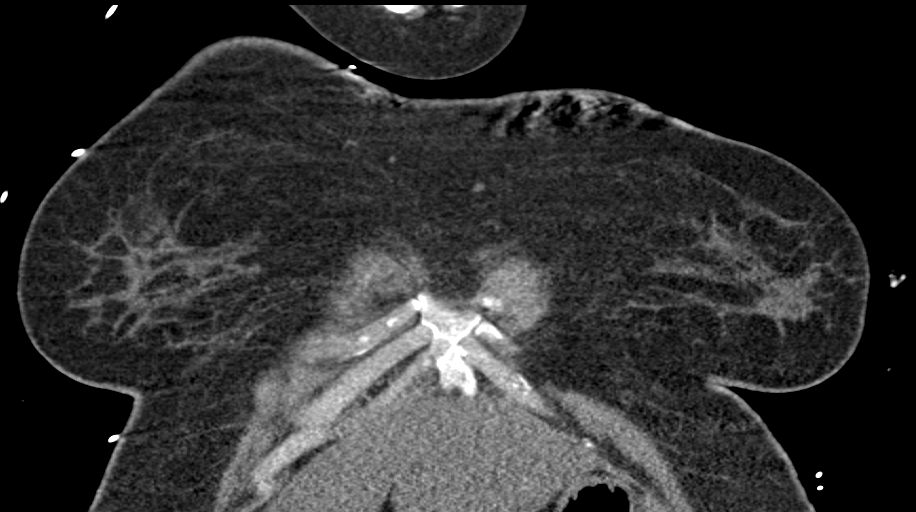
[im 84/168  soft-tissue]
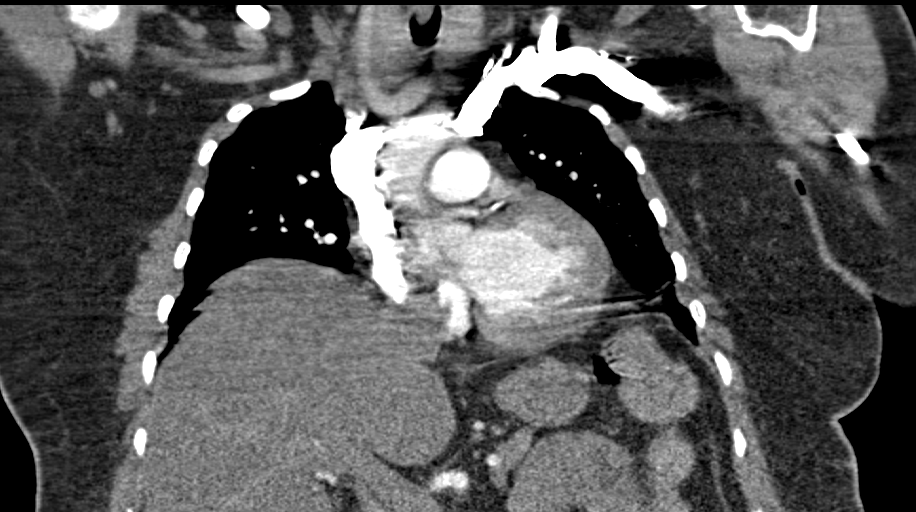
[im 126/168  soft-tissue]
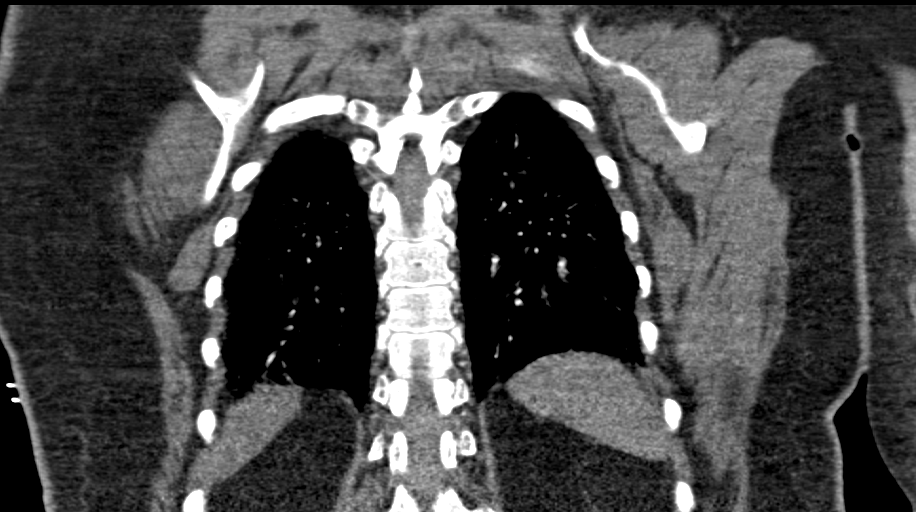

[12 of 46 positions shown; findings below may reference images not displayed]

FINDINGS: CTA CHEST FINDINGS

Cardiovascular: Satisfactory opacification the pulmonary arteries
without large central or lobar filling defects. More distal
evaluation limited by respiratory motion artifact. Central pulmonary
arteries are normal caliber. Normal heart size. No pericardial
effusion. Coronary artery calcifications are present. The aortic
root is suboptimally assessed given cardiac pulsation artifact. The
aorta is normal caliber. No acute luminal abnormality of the imaged
aorta. No periaortic stranding or hemorrhage. Normal 3 vessel
branching of the aortic arch. Proximal great vessels are
unremarkable. No major venous abnormalities.

Mediastinum/Nodes: No mediastinal fluid or gas. Normal thyroid gland
and thoracic inlet. No acute abnormality of the esophagus. Fairly
significant posterior bowing of the trachea and central airways,
greater than is typically expected for imaging during exhalation,
could reflect some degree of tracheal bronchomalacia, correlate with
clinical features. No worrisome mediastinal, hilar or axillary
adenopathy.

Lungs/Pleura: Evaluation limited by respiratory motion artifact. Low
lung volumes likely accentuated by imaging during exhalation for the
angiographic technique. No consolidation, features of edema,
pneumothorax, or effusion. Tiny 3 mm nodule in the periphery of the
left upper lobe, new from prior ([DATE]). No other concerning
pulmonary nodules or masses.

Musculoskeletal: Multilevel degenerative changes are present in the
imaged portions of the spine. No acute osseous abnormality or
suspicious osseous lesion. Worrisome chest wall lesions.

Review of the MIP images confirms the above findings.

CT ABDOMEN and PELVIS FINDINGS

Hepatobiliary: Diffuse hepatic hypoattenuation compatible with
hepatic steatosis. Sparing along the gallbladder fossa. Smooth liver
surface contour. No worrisome focal liver lesions. Normal
gallbladder and biliary tree.

Pancreas: No pancreatic ductal dilatation or surrounding
inflammatory changes.

Spleen: Normal in size. No concerning splenic lesions.

Adrenals/Urinary Tract: Normal adrenal glands. Kidneys are normally
located with symmetric enhancement and excretion. No suspicious
renal mass or urolithiasis. Asymmetric right urinary tract
dilatation with mild urothelial thickening and periureteral hazy
stranding which courses into the immediate vicinity of postsurgical
changes present in the deep pelvis and detailed below. No visible
obstructive urolithiasis is seen. Some fairly focal posterior
bladder wall thickening. No intraluminal gas.

Stomach/Bowel: Distal esophagus, stomach and duodenal sweep are
unremarkable. No small bowel wall thickening or dilatation. No
evidence of obstruction. Appendix is not visualized. No focal
inflammation the vicinity of the cecum to suggest an occult
appendicitis. No proximal colonic thickening or dilatation.
Rectosigmoid thickening is present in the vicinity of the
postsurgical change with some perirectal fat stranding as well.

Vascular/Lymphatic: Atherosclerotic calcifications within the
abdominal aorta and branch vessels. No aneurysm or ectasia. No
enlarged abdominopelvic lymph nodes.

Reproductive: Postsurgical changes from recent total abdominal
hysterectomy and bilateral salpingo-oophorectomy with some lobular
collection of air and fluid about the surgical site some of which is
demonstrating some early peripheral rim enhancement which may
reflect a developing abscess or collection with associated
thickening of the adjacent colon and bladder. Largest component of
this collection is present along the posterior bladder wall
measuring 3.2 x 5.6 x 5.1 cm in maximal dimensions and an elongated
configuration. Poorly discernible fat planes between the
rectosigmoid and bladder wall and this collection.

Other: Pelvic fluid collection, as above. Additional free fluid
layering in the presacral space and pericolic gutters is favored to
be redistributed. Some postsurgical changes are seen in the anterior
abdominal wall including few foci of subcutaneous gas likely at the
site of port placement. Some mild body wall edema as well. Some
focal skin thickening and subcutaneous fat stranding along the
anterior abdominal wall may also be related to injectable use such
as heparin.

Musculoskeletal: No acute osseous abnormality or suspicious osseous
lesion. Multilevel degenerative changes are present in the imaged
portions of the spine. Musculature is normal and symmetric.

Review of the MIP images confirms the above findings.
IMPRESSION: 1. No central or lobar pulmonary artery emboli. More distal
evaluation limited by respiratory motion artifact.
2. Postsurgical changes from recent total abdominal hysterectomy and
bilateral salpingo-oophorectomy with a lobular collection of air and
fluid about the surgical site/vaginal cuff some of which is
demonstrating some early peripheral rim enhancement which may
reflect a developing abscess with associated thickening of the
adjacent colon and bladder. Injury of bowel bladder is difficult to
fully exclude the absence of luminal contrast media.
3. Asymmetric right urinary tract dilatation with mild urothelial
thickening and periureteral hazy stranding which courses into the
vicinity of postsurgical changes present in the deep pelvis and
collection above. No visible obstructive urolithiasis is seen.
Findings could reflect inflammatory stricture secondary to the above
process. Furthermore could correlate with urinalysis to exclude
concomitant urinary tract infection.
4. New 3 mm nodule in the periphery of the left upper lobe. Consider
three to six-month follow-up imaging given patient's history of
known malignancy.
5. Fairly significant posterior bowing of the trachea and central
airways, greater than is typically expected for imaging during
exhalation, could reflect some degree of tracheobronchomalacia,
correlate with clinical features.
6. Hepatic steatosis.

These results were called by telephone at the time of interpretation
on 06/24/2020 at [DATE] to provider ASHLEIGH RI , who verbally
acknowledged these results.

## 2022-02-11 DIAGNOSIS — E119 Type 2 diabetes mellitus without complications: Secondary | ICD-10-CM

## 2022-02-16 ENCOUNTER — Emergency Department (HOSPITAL_COMMUNITY): Payer: BC Managed Care – PPO

## 2022-02-16 ENCOUNTER — Observation Stay (HOSPITAL_COMMUNITY)
Admission: EM | Admit: 2022-02-16 | Discharge: 2022-02-18 | Disposition: A | Discharge status: Home / Self Care | Payer: BC Managed Care – PPO | Attending: Family Medicine | Admitting: Family Medicine

## 2022-02-16 ENCOUNTER — Other Ambulatory Visit: Payer: Self-pay

## 2022-02-16 ENCOUNTER — Encounter (HOSPITAL_COMMUNITY): Payer: Self-pay | Admitting: Emergency Medicine

## 2022-02-16 DIAGNOSIS — E1142 Type 2 diabetes mellitus with diabetic polyneuropathy: Secondary | ICD-10-CM

## 2022-02-16 DIAGNOSIS — E669 Obesity, unspecified: Secondary | ICD-10-CM

## 2022-02-16 DIAGNOSIS — I959 Hypotension, unspecified: Secondary | ICD-10-CM | POA: Diagnosis not present

## 2022-02-16 DIAGNOSIS — E119 Type 2 diabetes mellitus without complications: Secondary | ICD-10-CM | POA: Diagnosis present

## 2022-02-16 DIAGNOSIS — Z79899 Other long term (current) drug therapy: Secondary | ICD-10-CM | POA: Insufficient documentation

## 2022-02-16 DIAGNOSIS — E66811 Obesity, class 1: Secondary | ICD-10-CM | POA: Diagnosis present

## 2022-02-16 DIAGNOSIS — E1140 Type 2 diabetes mellitus with diabetic neuropathy, unspecified: Secondary | ICD-10-CM | POA: Diagnosis not present

## 2022-02-16 DIAGNOSIS — N179 Acute kidney failure, unspecified: Secondary | ICD-10-CM | POA: Diagnosis not present

## 2022-02-16 DIAGNOSIS — E1169 Type 2 diabetes mellitus with other specified complication: Secondary | ICD-10-CM

## 2022-02-16 DIAGNOSIS — Z8542 Personal history of malignant neoplasm of other parts of uterus: Secondary | ICD-10-CM | POA: Insufficient documentation

## 2022-02-16 DIAGNOSIS — R42 Dizziness and giddiness: Secondary | ICD-10-CM | POA: Diagnosis present

## 2022-02-16 DIAGNOSIS — E039 Hypothyroidism, unspecified: Secondary | ICD-10-CM | POA: Diagnosis not present

## 2022-02-16 DIAGNOSIS — I10 Essential (primary) hypertension: Secondary | ICD-10-CM | POA: Diagnosis not present

## 2022-02-16 DIAGNOSIS — E1165 Type 2 diabetes mellitus with hyperglycemia: Secondary | ICD-10-CM | POA: Diagnosis present

## 2022-02-16 DIAGNOSIS — E785 Hyperlipidemia, unspecified: Secondary | ICD-10-CM

## 2022-02-16 HISTORY — DX: Other ascites: R18.8

## 2022-02-16 LAB — ETHANOL: Alcohol, Ethyl (B): <10 mg/dL (ref <10)

## 2022-02-16 LAB — URINALYSIS, ROUTINE W REFLEX MICROSCOPIC
Bilirubin Urine: NEGATIVE
Glucose, UA: NEGATIVE mg/dL
Hgb urine dipstick: NEGATIVE
Ketones, ur: NEGATIVE mg/dL
Leukocytes,Ua: NEGATIVE
Nitrite: NEGATIVE
Protein, ur: NEGATIVE mg/dL
Specific Gravity, Urine: 1.01 (ref 1.005–1.030)
pH: 6 (ref 5.0–8.0)

## 2022-02-16 LAB — CBC WITH DIFFERENTIAL/PLATELET
Abs Immature Granulocytes: 0 K/uL (ref 0.00–0.07)
Basophils Absolute: 0 K/uL (ref 0.0–0.1)
Basophils Relative: 1 %
Eosinophils Absolute: 0.2 K/uL (ref 0.0–0.5)
Eosinophils Relative: 4 %
HCT: 33.7 % — ABNORMAL LOW (ref 36.0–46.0)
Hemoglobin: 11.5 g/dL — ABNORMAL LOW (ref 12.0–15.0)
Immature Granulocytes: 0 %
Lymphocytes Relative: 46 %
Lymphs Abs: 2.5 K/uL (ref 0.7–4.0)
MCH: 29.5 pg (ref 26.0–34.0)
MCHC: 34.1 g/dL (ref 30.0–36.0)
MCV: 86.4 fL (ref 80.0–100.0)
Monocytes Absolute: 0.5 K/uL (ref 0.1–1.0)
Monocytes Relative: 8 %
Neutro Abs: 2.3 K/uL (ref 1.7–7.7)
Neutrophils Relative %: 41 %
Platelets: 176 K/uL (ref 150–400)
RBC: 3.9 MIL/uL (ref 3.87–5.11)
RDW: 13.4 % (ref 11.5–15.5)
WBC: 5.5 K/uL (ref 4.0–10.5)
nRBC: 0 % (ref 0.0–0.2)

## 2022-02-16 LAB — I-STAT CHEM 8, ED
BUN: 61 mg/dL — ABNORMAL HIGH (ref 8–23)
Calcium, Ion: 1.11 mmol/L — ABNORMAL LOW (ref 1.15–1.40)
Chloride: 97 mmol/L — ABNORMAL LOW (ref 98–111)
Creatinine, Ser: 1.8 mg/dL — ABNORMAL HIGH (ref 0.44–1.00)
Glucose, Bld: 245 mg/dL — ABNORMAL HIGH (ref 70–99)
HCT: 35 % — ABNORMAL LOW (ref 36.0–46.0)
Hemoglobin: 11.9 g/dL — ABNORMAL LOW (ref 12.0–15.0)
Potassium: 4.4 mmol/L (ref 3.5–5.1)
Sodium: 137 mmol/L (ref 135–145)
TCO2: 28 mmol/L (ref 22–32)

## 2022-02-16 LAB — COMPREHENSIVE METABOLIC PANEL WITH GFR
ALT: 40 U/L (ref 0–44)
AST: 50 U/L — ABNORMAL HIGH (ref 15–41)
Albumin: 3.9 g/dL (ref 3.5–5.0)
Alkaline Phosphatase: 66 U/L (ref 38–126)
Anion gap: 10 (ref 5–15)
BUN: 71 mg/dL — ABNORMAL HIGH (ref 8–23)
CO2: 30 mmol/L (ref 22–32)
Calcium: 9.6 mg/dL (ref 8.9–10.3)
Chloride: 98 mmol/L (ref 98–111)
Creatinine, Ser: 1.68 mg/dL — ABNORMAL HIGH (ref 0.44–1.00)
GFR, Estimated: 34 mL/min — ABNORMAL LOW
Glucose, Bld: 232 mg/dL — ABNORMAL HIGH (ref 70–99)
Potassium: 4.5 mmol/L (ref 3.5–5.1)
Sodium: 138 mmol/L (ref 135–145)
Total Bilirubin: 0.5 mg/dL (ref 0.3–1.2)
Total Protein: 8.3 g/dL — ABNORMAL HIGH (ref 6.5–8.1)

## 2022-02-16 LAB — PROTIME-INR
INR: 1.1 (ref 0.8–1.2)
Prothrombin Time: 13.6 seconds (ref 11.4–15.2)

## 2022-02-16 LAB — CBG MONITORING, ED
Glucose-Capillary: 215 mg/dL — ABNORMAL HIGH (ref 70–99)
Glucose-Capillary: 156 mg/dL — ABNORMAL HIGH (ref 70–99)

## 2022-02-16 LAB — APTT: aPTT: 24 seconds (ref 24–36)

## 2022-02-16 MED ORDER — PREGABALIN 75 MG PO CAPS
150.0000 mg | ORAL_CAPSULE | Freq: Three times a day (TID) | ORAL | Status: DC
  Administered 2022-02-16 – 2022-02-18 (×5): 150 mg via ORAL
  Filled 2022-02-16: qty 2
  Filled 2022-02-16 (×3): qty 3
  Filled 2022-02-16: qty 2

## 2022-02-16 MED ORDER — ACETAMINOPHEN 650 MG RE SUPP: 650.0000 mg | Freq: Four times a day (QID) | RECTAL | Status: DC | PRN

## 2022-02-16 MED ORDER — SODIUM CHLORIDE 0.9% FLUSH
3.0000 mL | Freq: Two times a day (BID) | INTRAVENOUS | Status: DC
  Administered 2022-02-16 – 2022-02-17 (×3): 3 mL via INTRAVENOUS

## 2022-02-16 MED ORDER — SODIUM CHLORIDE 0.9 % IV SOLN: INTRAVENOUS | Status: DC

## 2022-02-16 MED ORDER — POLYETHYLENE GLYCOL 3350 17 G PO PACK: 17.0000 g | PACK | Freq: Every day | ORAL | Status: DC | PRN

## 2022-02-16 MED ORDER — SODIUM CHLORIDE 0.9 % IV BOLUS
1000.0000 mL | Freq: Once | INTRAVENOUS | Status: AC
  Administered 2022-02-16: 1000 mL via INTRAVENOUS

## 2022-02-16 MED ORDER — INSULIN NPH (HUMAN) (ISOPHANE) 100 UNIT/ML ~~LOC~~ SUSP
50.0000 [IU] | Freq: Three times a day (TID) | SUBCUTANEOUS | Status: DC
  Administered 2022-02-17 – 2022-02-18 (×3): 50 [IU] via SUBCUTANEOUS
  Filled 2022-02-16: qty 10

## 2022-02-16 MED ORDER — ENOXAPARIN SODIUM 40 MG/0.4ML IJ SOSY
40.0000 mg | PREFILLED_SYRINGE | INTRAMUSCULAR | Status: DC
  Filled 2022-02-16: qty 0.4

## 2022-02-16 MED ORDER — INSULIN REGULAR HUMAN 100 UNIT/ML IJ SOPN: 50.0000 [IU] | PEN_INJECTOR | Freq: Three times a day (TID) | INTRAMUSCULAR | Status: DC

## 2022-02-16 MED ORDER — LEVOTHYROXINE SODIUM 50 MCG PO TABS
50.0000 ug | ORAL_TABLET | Freq: Every day | ORAL | Status: DC
  Administered 2022-02-17 – 2022-02-18 (×2): 50 ug via ORAL
  Filled 2022-02-16 (×2): qty 1

## 2022-02-16 MED ORDER — ACETAMINOPHEN 325 MG PO TABS: 650.0000 mg | ORAL_TABLET | Freq: Four times a day (QID) | ORAL | Status: DC | PRN

## 2022-02-16 MED ORDER — INSULIN ASPART 100 UNIT/ML IJ SOLN
0.0000 [IU] | Freq: Three times a day (TID) | INTRAMUSCULAR | Status: DC
  Administered 2022-02-17: 5 [IU] via SUBCUTANEOUS
  Administered 2022-02-17: 8 [IU] via SUBCUTANEOUS
  Filled 2022-02-16: qty 0.15

## 2022-02-16 NOTE — ED Provider Notes (Signed)
[facility] DEPT Provider Note   CSN: [id number] Arrival date & time: 02/16/22  1345     History  Chief Complaint  Patient presents with   Hypotension   Weakness   Dizziness    Christy Carpenter is a 66 y.o. female with a  PMHx of HTN, DM, who presents to the ED complaining of hypotension onset today. Patient has associated dizziness, lightheadedness, fatigue, generalized weakness.  Was evaluated by primary care today and noted to come into the emergency department due to her hypotension. Patient's blood pressure at home typically runs 009 systolically.  Has noted that her blood pressure has been lower today. Denies past medical history of stroke.  Denies anticoagulant use at this time.  Denies fever, nausea, vomiting, chest pain.  Husband with patient and notes that patient has had slurred speech onset 5 days.  Patient notes that she was unable to take her medications this morning.  The history is provided by the patient. No language interpreter was used.       Home Medications Prior to Admission medications   Medication Sig Start Date End Date Taking? Authorizing Provider  amitriptyline (ELAVIL) 50 MG tablet Take 50 mg by mouth at bedtime. 02/04/22  Yes [provider]  levothyroxine (SYNTHROID) 50 MCG tablet Take 50 mcg by mouth daily before breakfast.   Yes [provider]  NOVOLIN N FLEXPEN 100 UNIT/ML Kiwkpen Inject 75 Units into the skin 3 (three) times daily.  04/17/20  Yes [provider]  NOVOLIN R FLEXPEN 100 UNIT/ML SOPN Inject 65-75 Units into the skin 3 (three) times daily. Inject 75 units BID & Inject 65 units at bedtime 04/17/20  Yes [provider]  OZEMPIC, 2 MG/DOSE, 8 MG/3ML SOPN Inject 2 mg into the skin once a week. 01/28/22  Yes [provider]  pregabalin (LYRICA) 150 MG capsule Take 150 mg by mouth 3 (three) times daily. 01/08/22  Yes [provider]  torsemide (DEMADEX) 20 MG tablet Take  20 mg by mouth daily. 12/21/21  Yes [provider]  valsartan-hydrochlorothiazide (DIOVAN-HCT) 320-25 MG tablet Take 1 tablet by mouth daily.   Yes [provider]  atorvastatin (LIPITOR) 40 MG tablet Take 1 tablet (40 mg total) by mouth daily at 6 PM. Patient not taking: Reported on 02/16/2022 08/20/17   Raiford Noble Latif, DO  buPROPion [facility] SR) 200 MG 12 hr tablet Take 1 tablet (200 mg total) by mouth daily. Patient not taking: Reported on 02/16/2022 09/24/21   Jearld Lesch A, DO  Continuous Blood Gluc Sensor (FREESTYLE LIBRE 2 SENSOR) MISC  06/13/20   [provider]  Vitamin D, Ergocalciferol, (DRISDOL) 1.25 MG (50000 UNIT) CAPS capsule Take 1 capsule (50,000 Units total) by mouth every 7 (seven) days. Patient not taking: Reported on 02/16/2022 09/24/21   Jearld Lesch A, DO      Allergies    Byetta 10 mcg pen [exenatide], Dapagliflozin, Januvia [sitagliptin], and Invokana [canagliflozin]    Review of Systems   Review of Systems  Constitutional:  Positive for fatigue. Negative for fever.  Cardiovascular:  Negative for chest pain.  Gastrointestinal:  Negative for nausea and vomiting.  Neurological:  Positive for dizziness, weakness and light-headedness.  All other systems reviewed and are negative.   Physical Exam Updated Vital Signs BP 136/63   Pulse 88   Temp 97.7 F (36.5 C) (Oral)   Resp 14   SpO2 94%  Physical Exam Vitals and nursing note reviewed.  Constitutional:  General: She is not in acute distress.    Appearance: She is not diaphoretic.     Comments: Slurred speech noted on exam.  HENT:     Head: Normocephalic and atraumatic.     Mouth/Throat:     Pharynx: No oropharyngeal exudate.  Eyes:     General: No scleral icterus.    Conjunctiva/sclera: Conjunctivae normal.  Cardiovascular:     Rate and Rhythm: Normal rate and regular rhythm.     Pulses: Normal pulses.     Heart sounds: Normal heart sounds.  Pulmonary:     Effort:  Pulmonary effort is normal. No respiratory distress.     Breath sounds: Normal breath sounds. No wheezing.  Abdominal:     General: Bowel sounds are normal.     Palpations: Abdomen is soft. There is no mass.     Tenderness: There is no abdominal tenderness. There is no guarding or rebound.  Musculoskeletal:        General: Normal range of motion.     Cervical back: Normal range of motion and neck supple.  Skin:    General: Skin is warm and dry.  Neurological:     Mental Status: She is alert.     Sensory: No sensory deficit.     Motor: No pronator drift.     Coordination: Finger-Nose-Finger Test abnormal.     Comments: Patient with difficulty with finger-nose testing on the right side (patient notes that typically she would have had no difficulty with completing this task), no difficulty with finger-nose on the left.  Unsteady gait on exam.   Psychiatric:        Behavior: Behavior normal.     ED Results / Procedures / Treatments   Labs (all labs ordered are listed, but only abnormal results are displayed) Labs Reviewed  COMPREHENSIVE METABOLIC PANEL - Abnormal; Notable for the following components:      Result Value   Glucose, Bld 232 (*)    BUN 71 (*)    Creatinine, Ser 1.68 (*)    Total Protein 8.3 (*)    AST 50 (*)    GFR, Estimated 34 (*)    All other components within normal limits  CBC WITH DIFFERENTIAL/PLATELET - Abnormal; Notable for the following components:   Hemoglobin 11.5 (*)    HCT 33.7 (*)    All other components within normal limits  CBG MONITORING, ED - Abnormal; Notable for the following components:   Glucose-Capillary 215 (*)    All other components within normal limits  I-STAT CHEM 8, ED - Abnormal; Notable for the following components:   Chloride 97 (*)    BUN 61 (*)    Creatinine, Ser 1.80 (*)    Glucose, Bld 245 (*)    Calcium, Ion 1.11 (*)    Hemoglobin 11.9 (*)    HCT 35.0 (*)    All other components within normal limits  URINALYSIS, ROUTINE  W REFLEX MICROSCOPIC  PROTIME-INR  APTT  ETHANOL  RAPID URINE DRUG SCREEN, HOSP PERFORMED    EKG EKG Interpretation  Date/Time:  Monday February 16 2022 16:22:35 EDT Ventricular Rate:  90 PR Interval:  178 QRS Duration: 96 QT Interval:  371 QTC Calculation: 454 R Axis:   28 Text Interpretation: Sinus rhythm Low voltage, precordial leads No significant change since last tracing Confirmed by Wandra Arthurs [phone]) on 02/16/2022 9:45:40 PM  Radiology MR BRAIN WO CONTRAST  Result Date: 02/16/2022 CLINICAL DATA:  Acute neurologic deficit EXAM: MRI HEAD  WITHOUT CONTRAST MRA HEAD WITHOUT CONTRAST TECHNIQUE: Multiplanar, multi-echo pulse sequences of the brain and surrounding structures were acquired without intravenous contrast. Angiographic images of the Circle of Willis were acquired using MRA technique without intravenous contrast. COMPARISON:  None Available. FINDINGS: MRI HEAD FINDINGS Brain: No acute infarct, mass effect or extra-axial collection. No acute or chronic hemorrhage. There is multifocal hyperintense T2-weighted signal within the white matter. Parenchymal volume and CSF spaces are normal. The midline structures are normal. Vascular: Major flow voids are preserved. Skull and upper cervical spine: Normal calvarium and skull base. Visualized upper cervical spine and soft tissues are normal. Sinuses/Orbits:No paranasal sinus fluid levels or advanced mucosal thickening. No mastoid or middle ear effusion. Normal orbits. MRA HEAD FINDINGS POSTERIOR CIRCULATION: --Vertebral arteries: Normal --Inferior cerebellar arteries: Normal. --Basilar artery: Normal. --Superior cerebellar arteries: Normal. --Posterior cerebral arteries: Normal. ANTERIOR CIRCULATION: --Intracranial internal carotid arteries: Normal. --Anterior cerebral arteries (ACA): Normal. --Middle cerebral arteries (MCA): Normal. ANATOMIC VARIANTS: Both P comms are patent. Hypoplastic left A1 segment of the anterior cerebral artery.  IMPRESSION: 1. No acute intracranial abnormality. 2. Normal intracranial MRA. 3. Multifocal white matter disease, most consistent with chronic small vessel ischemia. Electronically Signed   By: Ulyses Jarred M.D.   On: 02/16/2022 21:26   MR ANGIO HEAD WO CONTRAST  Result Date: 02/16/2022 CLINICAL DATA:  Acute neurologic deficit EXAM: MRI HEAD WITHOUT CONTRAST MRA HEAD WITHOUT CONTRAST TECHNIQUE: Multiplanar, multi-echo pulse sequences of the brain and surrounding structures were acquired without intravenous contrast. Angiographic images of the Circle of Willis were acquired using MRA technique without intravenous contrast. COMPARISON:  None Available. FINDINGS: MRI HEAD FINDINGS Brain: No acute infarct, mass effect or extra-axial collection. No acute or chronic hemorrhage. There is multifocal hyperintense T2-weighted signal within the white matter. Parenchymal volume and CSF spaces are normal. The midline structures are normal. Vascular: Major flow voids are preserved. Skull and upper cervical spine: Normal calvarium and skull base. Visualized upper cervical spine and soft tissues are normal. Sinuses/Orbits:No paranasal sinus fluid levels or advanced mucosal thickening. No mastoid or middle ear effusion. Normal orbits. MRA HEAD FINDINGS POSTERIOR CIRCULATION: --Vertebral arteries: Normal --Inferior cerebellar arteries: Normal. --Basilar artery: Normal. --Superior cerebellar arteries: Normal. --Posterior cerebral arteries: Normal. ANTERIOR CIRCULATION: --Intracranial internal carotid arteries: Normal. --Anterior cerebral arteries (ACA): Normal. --Middle cerebral arteries (MCA): Normal. ANATOMIC VARIANTS: Both P comms are patent. Hypoplastic left A1 segment of the anterior cerebral artery. IMPRESSION: 1. No acute intracranial abnormality. 2. Normal intracranial MRA. 3. Multifocal white matter disease, most consistent with chronic small vessel ischemia. Electronically Signed   By: Ulyses Jarred M.D.   On: 02/16/2022  21:26   CT Head Wo Contrast  Result Date: 02/16/2022 CLINICAL DATA:  Mental status change, unknown cause EXAM: CT HEAD WITHOUT CONTRAST TECHNIQUE: Contiguous axial images were obtained from the base of the skull through the vertex without intravenous contrast. RADIATION DOSE REDUCTION: This exam was performed according to the departmental dose-optimization program which includes automated exposure control, adjustment of the mA and/or kV according to patient size and/or use of iterative reconstruction technique. COMPARISON:  None Available. FINDINGS: Brain: No evidence of acute intracranial hemorrhage or extra-axial collection.No evidence of mass lesion/concerning mass effect.The ventricles are normal in size.Scattered subcortical and periventricular white matter hypodensities, nonspecific but likely sequela of chronic small vessel ischemic disease. Vascular: No hyperdense vessel or unexpected calcification. Skull: Normal. Negative for fracture or focal lesion. Sinuses/Orbits: No acute finding. Other: None. IMPRESSION: No acute intracranial abnormality. Mild sequela of chronic small  vessel ischemic disease. Electronically Signed   By: Maurine Simmering M.D.   On: 02/16/2022 15:13    Procedures Procedures    Medications Ordered in ED Medications  sodium chloride 0.9 % bolus 1,000 mL (has no administration in time range)  sodium chloride 0.9 % bolus 1,000 mL (0 mLs Intravenous Stopped 02/16/22 1700)    ED Course/ Medical Decision Making/ A&P Clinical Course as of 02/16/22 [place] Feb 16, 2022  1528 Attending into evaluate patient [SB]  1734 Patient re-evaluated and noted that her dizziness has resolved some with the IVF.  [SB]  2215 Consult with Hospitalist, Dr. Trilby Drummer who will see the patient in consult. [SB]    Clinical Course User Index [SB] Nataley Bahri A, PA-C                           Medical Decision Making Amount and/or Complexity of Data Reviewed Labs: ordered. Radiology:  ordered.  Risk Decision regarding hospitalization.   Pt presents with hypotension onset today. Has a history of HTN and DM, compliant with meds. Vital signs stable, patient not tachycardic, not hypotensive, or febrile at this time.  On exam patient with strength and sensation intact to bilateral upper and lower extremities.  Patient with difficulty with finger-nose testing on the right side (patient notes that typically she would have had no difficulty with completing this task), no difficulty with finger-nose on the left.  Mild slurred speech noted on exam.  No acute cardiovascular respiratory exam findings.  Differential diagnosis includes TIA, CVA, SAH, electrolyte abnormality, acute cystitis, anemia.   Co morbidities that complicate the patient evaluation: Diabetes, hypertension  Additional history obtained:  Additional history obtained from Spouse/Significant Other  Labs:  I ordered, and personally interpreted labs.  The pertinent results include:   Ethanol unremarkable. Coags unremarkable. CBG at 156. UDS and urinalysis ordered with results pending at time of admission. I-STAT Chem-8 notable for BUN elevated at 61, creatinine elevated at 1.8, glucose elevated at 245 otherwise unremarkable. CMP with elevated glucose at 232, BUN elevated at 71, creatinine elevated at 1.68, GFR decreased to 34 otherwise unremarkable. CBC without leukocytosis and unremarkable  Imaging: I ordered imaging studies including CT head, MRI brain, MRA brain I independently visualized and interpreted imaging which showed: CT head:  No acute intracranial abnormality.  Mild sequela of chronic small vessel ischemic disease.   MRI brain:  1. No acute intracranial abnormality.  2. Normal intracranial MRA.  3. Multifocal white matter disease, most consistent with chronic  small vessel ischemia.   I agree with the radiologist interpretation  Medications:  I ordered medication including IV fluids for AKI  and repletion' \\I'$  have reviewed the patients home medicines and have made adjustments as needed  Consultations: I requested consultation with the Hospitalist, Dr. Trilby Drummer and discussed lab and imaging findings as well as pertinent plan - they recommend: Admission to the hospital  Disposition: Patient presentation suspicious for AKI likely due to prerenal causes.  Doubt CVA, TIA, SAH at this time.  Doubt electrolyte abnormality or anemia at this time. After consideration of the diagnostic results and the patients response to treatment, I feel that the patient would benefit from Admission to the hospital.  Discussed with patient and husband at bedside regarding admission plans.  Patient has been agreeable at this time for admission.  Patient appears safe for admission at this time.     This chart was dictated using voice recognition  software, Diplomatic Services operational officer. Despite the best efforts of this provider to proofread and correct errors, errors may still occur which can change documentation meaning.  Final Clinical Impression(s) / ED Diagnoses Final diagnoses:  AKI (acute kidney injury) ([place])  Hypotension, unspecified hypotension type    Rx / DC Orders ED Discharge Orders     None         Katalina Magri A, PA-C 02/16/22 2227    Regan Lemming, MD 02/17/22 [phone]

## 2022-02-16 NOTE — ED Notes (Addendum)
Patient refused 2nd IV at this time.

## 2022-02-16 NOTE — ED Notes (Signed)
Pt went to bathroom unaware that urine was needed.

## 2022-02-16 NOTE — ED Provider Triage Note (Signed)
Emergency Medicine Provider Triage Evaluation Note  Christy Carpenter , a 66 y.o. female  was evaluated in triage.  Pt complains of hypotension onset today. Patient has associated dizziness, lightheadedness, fatigue, generalized weakness.  Was evaluated by primary care today and noted to come into the emergency department due to her hypotension. Patient's blood pressure at home typically runs 622 systolically.  Has noted that her blood pressure has been lower today. Denies past medical history of stroke.  Denies anticoagulant use at this time.  Denies fever, nausea, vomiting, chest pain.  Husband with patient and notes that patient has had slurred speech onset 5 days.   Review of Systems  Positive: As per HPI Negative:   Physical Exam  BP 108/69   Pulse 93   Temp 97.7 F (36.5 C) (Oral)   Resp 17   SpO2 96%  Gen:   Awake, no distress, patient with slurred speech, patient with slurred speech Resp:  Normal effort  MSK:   Moves extremities without difficulty. Grip strength Other:  Strength and sensation intact to bilateral upper and lower extremities.  Patient with difficulty with finger-nose testing on the right side (patient notes that typically she would have had no difficulty with completing this task), no difficulty with finger-nose on the left.  Medical Decision Making  Medically screening exam initiated at 2:23 PM.  Appropriate orders placed.  DALISA FORRER was informed that the remainder of the evaluation will be completed by another provider, this initial triage assessment does not replace that evaluation, and the importance of remaining in the ED until their evaluation is complete.  2:36 PM - Discussed with RN that patient is in need of a room immediately. RN aware and working on room placement.    Emary Zalar A, PA-C 02/16/22 1442

## 2022-02-16 NOTE — H&P (Signed)
History and Physical   Christy Carpenter EXH:[id number] DOB: 07/14/1956 DOA: 02/16/2022  PCP: [name], MD   Patient coming from: Home / PCP office  Chief Complaint: Hypotension, Weakness, Dizziness  HPI: Christy Carpenter is a 66 y.o. female with medical history significant of HLD, DM, HTN, Hypothyroidism, GERM, Obesity, Sickle cell trait, Endometrial cancer presenting with hypotension.  Patient presenting with hypotension, weakness, dizziness today and 5 days of slurred speech. She went to her PCP for evaluation earlier today and was sent to the ED due to concerns of hypotension and her slurred speech.  She denies fevers, chills, chest pain, shortness of breath, abdominal pain, constipation, diarrhea, nausea, vomiting.  ED Course: Vital signs in the ED stable.  Lab work-up included CMP with BUN of 71, creatinine elevated to 1.68 from baseline of 1-1.2, glucose 232, calcium 8.3, AST 50.  CBC with hemoglobin stable at 11.5.  PT, PTT, INR within normal limits.  Urinalysis, and UDS pending.  Ethanol level negative.  CT head without acute abnormalities.  MRI brain without acute abnormalities.  MRI brain without acute abnormalities.  Patient received 2 L IV fluid in the ED.  Review of Systems: As per HPI otherwise all other systems reviewed and are negative.  Past Medical History:  Diagnosis Date   Bacteremia 06/28/2020   Cancer ([place])    endometreal ut   Chest pain 08/19/2017   Diabetes mellitus    Edema of both lower extremities    GERD (gastroesophageal reflux disease)    Hypercholesterolemia    Hypertension    Ileus (HCC)    Neuropathy of both feet    Pelvic fluid collection    Plantar fasciitis    Left foot   SIRS due to infectious process with acute organ dysfunction ([place]) 06/24/2020    Past Surgical History:  Procedure Laterality Date   HAND LIGAMENT RECONSTRUCTION     ROBOTIC ASSISTED TOTAL HYSTERECTOMY WITH BILATERAL SALPINGO OOPHERECTOMY N/A 06/20/2020   Procedure: XI ROBOTIC  ASSISTED TOTAL HYSTERECTOMY WITH BILATERAL SALPINGO OOPHORECTOMY, RIGHT URETERAL LYSIS;  Surgeon: [name], MD;  Location: [facility];  Service: Gynecology;  Laterality: N/A;   SENTINEL NODE BIOPSY N/A 06/20/2020   Procedure: SENTINEL NODE BIOPSY;  Surgeon: [name], MD;  Location: [facility];  Service: Gynecology;  Laterality: N/A;    Social History  reports that she has never smoked. She has never used smokeless tobacco. She reports that she does not drink alcohol and does not use drugs.  Allergies  Allergen Reactions   Byetta 10 Mcg Pen [Exenatide] Itching   Dapagliflozin Other (See Comments)    Yeast infections   Januvia [Sitagliptin]     Yeast infections    Invokana [Canagliflozin] Other (See Comments)    Yeast infections    Family History  Problem Relation Age of Onset   Diabetes Mellitus II Mother    Stroke Mother    Hypertension Mother    Kidney disease Mother    Stroke Brother    Diabetes Mellitus II Brother    Hypertension Brother    Cancer Paternal Aunt        ovarian  Reviewed on admission  Prior to Admission medications   Medication Sig Start Date End Date Taking? Authorizing Provider  amitriptyline (ELAVIL) 50 MG tablet Take 50 mg by mouth at bedtime. 02/04/22  Yes [provider]  levothyroxine (SYNTHROID) 50 MCG tablet Take 50 mcg by mouth daily before breakfast.   Yes [provider]  Deetta Perla  100 UNIT/ML Kiwkpen Inject 75 Units into the skin 3 (three) times daily.  04/17/20  Yes [provider]  NOVOLIN R FLEXPEN 100 UNIT/ML SOPN Inject 65-75 Units into the skin 3 (three) times daily. Inject 75 units BID & Inject 65 units at bedtime 04/17/20  Yes [provider]  OZEMPIC, 2 MG/DOSE, 8 MG/3ML SOPN Inject 2 mg into the skin once a week. 01/28/22  Yes [provider]  pregabalin (LYRICA) 150 MG capsule Take 150 mg by mouth 3 (three) times daily. 01/08/22  Yes [provider]  torsemide (DEMADEX) 20 MG tablet  Take 20 mg by mouth daily. 12/21/21  Yes [provider]  valsartan-hydrochlorothiazide (DIOVAN-HCT) 320-25 MG tablet Take 1 tablet by mouth daily.   Yes [provider]  atorvastatin (LIPITOR) 40 MG tablet Take 1 tablet (40 mg total) by mouth daily at 6 PM. Patient not taking: Reported on 02/16/2022 08/20/17   Raiford Noble Latif, DO  buPROPion [facility] SR) 200 MG 12 hr tablet Take 1 tablet (200 mg total) by mouth daily. Patient not taking: Reported on 02/16/2022 09/24/21   Jearld Lesch A, DO  Continuous Blood Gluc Sensor (FREESTYLE LIBRE 2 SENSOR) MISC  06/13/20   [provider]  Vitamin D, Ergocalciferol, (DRISDOL) 1.25 MG (50000 UNIT) CAPS capsule Take 1 capsule (50,000 Units total) by mouth every 7 (seven) days. Patient not taking: Reported on 02/16/2022 09/24/21   Georgia Lopes, DO    Physical Exam: Vitals:   02/16/22 1800 02/16/22 1900 02/16/22 1945 02/16/22 2158  BP: 132/75 120/76 136/63 128/81  Pulse: 91 88 88 92  Resp: '15 15 14 14  '$ Temp:      TempSrc:      SpO2: 95% 97% 94% 98%    Physical Exam Constitutional:      General: She is not in acute distress.    Appearance: Normal appearance. She is obese.  HENT:     Head: Normocephalic and atraumatic.     Mouth/Throat:     Mouth: Mucous membranes are moist.     Pharynx: Oropharynx is clear.  Eyes:     Extraocular Movements: Extraocular movements intact.     Pupils: Pupils are equal, round, and reactive to light.  Cardiovascular:     Rate and Rhythm: Normal rate and regular rhythm.     Pulses: Normal pulses.     Heart sounds: Normal heart sounds.  Pulmonary:     Effort: Pulmonary effort is normal. No respiratory distress.     Breath sounds: Normal breath sounds.  Abdominal:     General: Bowel sounds are normal. There is no distension.     Palpations: Abdomen is soft.     Tenderness: There is no abdominal tenderness.  Musculoskeletal:        General: No swelling or deformity.  Skin:    General:  Skin is warm and dry.  Neurological:     General: No focal deficit present.     Mental Status: Mental status is at baseline.     Comments: No longer with slurred speech     Labs on Admission: I have personally reviewed following labs and imaging studies  CBC: Recent Labs  Lab 02/16/22 1442 02/16/22 1632  WBC 5.5  --   NEUTROABS 2.3  --   HGB 11.5* 11.9*  HCT 33.7* 35.0*  MCV 86.4  --   PLT 176  --     Basic Metabolic Panel: Recent Labs  Lab 02/16/22 1442 02/16/22 1632  NA 138 137  K 4.5 4.4  CL 98 97*  CO2 30  --   GLUCOSE 232* 245*  BUN 71* 61*  CREATININE 1.68* 1.80*  CALCIUM 9.6  --     GFR: CrCl cannot be calculated (Unknown ideal weight.).  Liver Function Tests: Recent Labs  Lab 02/16/22 1442  AST 50*  ALT 40  ALKPHOS 66  BILITOT 0.5  PROT 8.3*  ALBUMIN 3.9    Urine analysis:    Component Value Date/Time   COLORURINE YELLOW 06/24/2020 0109   APPEARANCEUR CLEAR 06/24/2020 0109   LABSPEC 1.020 07/28/2021 1502   PHURINE 5.5 07/28/2021 1502   GLUCOSEU NEGATIVE 07/28/2021 1502   HGBUR NEGATIVE 07/28/2021 1502   [place] 07/28/2021 1502   KETONESUR NEGATIVE 07/28/2021 1502   PROTEINUR NEGATIVE 07/28/2021 1502   UROBILINOGEN 0.2 07/28/2021 1502   NITRITE NEGATIVE 07/28/2021 1502   LEUKOCYTESUR NEGATIVE 07/28/2021 1502    Radiological Exams on Admission: MR BRAIN WO CONTRAST  Result Date: 02/16/2022 CLINICAL DATA:  Acute neurologic deficit EXAM: MRI HEAD WITHOUT CONTRAST MRA HEAD WITHOUT CONTRAST TECHNIQUE: Multiplanar, multi-echo pulse sequences of the brain and surrounding structures were acquired without intravenous contrast. Angiographic images of the Circle of Willis were acquired using MRA technique without intravenous contrast. COMPARISON:  None Available. FINDINGS: MRI HEAD FINDINGS Brain: No acute infarct, mass effect or extra-axial collection. No acute or chronic hemorrhage. There is multifocal hyperintense T2-weighted signal  within the white matter. Parenchymal volume and CSF spaces are normal. The midline structures are normal. Vascular: Major flow voids are preserved. Skull and upper cervical spine: Normal calvarium and skull base. Visualized upper cervical spine and soft tissues are normal. Sinuses/Orbits:No paranasal sinus fluid levels or advanced mucosal thickening. No mastoid or middle ear effusion. Normal orbits. MRA HEAD FINDINGS POSTERIOR CIRCULATION: --Vertebral arteries: Normal --Inferior cerebellar arteries: Normal. --Basilar artery: Normal. --Superior cerebellar arteries: Normal. --Posterior cerebral arteries: Normal. ANTERIOR CIRCULATION: --Intracranial internal carotid arteries: Normal. --Anterior cerebral arteries (ACA): Normal. --Middle cerebral arteries (MCA): Normal. ANATOMIC VARIANTS: Both P comms are patent. Hypoplastic left A1 segment of the anterior cerebral artery. IMPRESSION: 1. No acute intracranial abnormality. 2. Normal intracranial MRA. 3. Multifocal white matter disease, most consistent with chronic small vessel ischemia. Electronically Signed   By: Ulyses Jarred M.D.   On: 02/16/2022 21:26   MR ANGIO HEAD WO CONTRAST  Result Date: 02/16/2022 CLINICAL DATA:  Acute neurologic deficit EXAM: MRI HEAD WITHOUT CONTRAST MRA HEAD WITHOUT CONTRAST TECHNIQUE: Multiplanar, multi-echo pulse sequences of the brain and surrounding structures were acquired without intravenous contrast. Angiographic images of the Circle of Willis were acquired using MRA technique without intravenous contrast. COMPARISON:  None Available. FINDINGS: MRI HEAD FINDINGS Brain: No acute infarct, mass effect or extra-axial collection. No acute or chronic hemorrhage. There is multifocal hyperintense T2-weighted signal within the white matter. Parenchymal volume and CSF spaces are normal. The midline structures are normal. Vascular: Major flow voids are preserved. Skull and upper cervical spine: Normal calvarium and skull base. Visualized upper  cervical spine and soft tissues are normal. Sinuses/Orbits:No paranasal sinus fluid levels or advanced mucosal thickening. No mastoid or middle ear effusion. Normal orbits. MRA HEAD FINDINGS POSTERIOR CIRCULATION: --Vertebral arteries: Normal --Inferior cerebellar arteries: Normal. --Basilar artery: Normal. --Superior cerebellar arteries: Normal. --Posterior cerebral arteries: Normal. ANTERIOR CIRCULATION: --Intracranial internal carotid arteries: Normal. --Anterior cerebral arteries (ACA): Normal. --Middle cerebral arteries (MCA): Normal. ANATOMIC VARIANTS: Both P comms are patent. Hypoplastic left A1 segment of the anterior cerebral artery. IMPRESSION: 1. No  acute intracranial abnormality. 2. Normal intracranial MRA. 3. Multifocal white matter disease, most consistent with chronic small vessel ischemia. Electronically Signed   By: Ulyses Jarred M.D.   On: 02/16/2022 21:26   CT Head Wo Contrast  Result Date: 02/16/2022 CLINICAL DATA:  Mental status change, unknown cause EXAM: CT HEAD WITHOUT CONTRAST TECHNIQUE: Contiguous axial images were obtained from the base of the skull through the vertex without intravenous contrast. RADIATION DOSE REDUCTION: This exam was performed according to the departmental dose-optimization program which includes automated exposure control, adjustment of the mA and/or kV according to patient size and/or use of iterative reconstruction technique. COMPARISON:  None Available. FINDINGS: Brain: No evidence of acute intracranial hemorrhage or extra-axial collection.No evidence of mass lesion/concerning mass effect.The ventricles are normal in size.Scattered subcortical and periventricular white matter hypodensities, nonspecific but likely sequela of chronic small vessel ischemic disease. Vascular: No hyperdense vessel or unexpected calcification. Skull: Normal. Negative for fracture or focal lesion. Sinuses/Orbits: No acute finding. Other: None. IMPRESSION: No acute intracranial  abnormality. Mild sequela of chronic small vessel ischemic disease. Electronically Signed   By: Maurine Simmering M.D.   On: 02/16/2022 15:13    EKG: Independently reviewed.  Sinus rhythm at 90 bpm.  Nonspecific T wave flattening.  Low voltage in some leads.  Assessment/Plan Principal Problem:   AKI (acute kidney injury) ([place]) Active Problems:   Hypercholesterolemia   Uncontrolled type 2 diabetes mellitus with hyperglycemia (HCC)   Essential hypertension   Diabetic neuropathy (HCC)   Obesity (BMI 30.0-34.9)   Hypothyroidism   Gastroesophageal reflux disease   Type 2 diabetes mellitus with hyperlipidemia (HCC)   AKI > Patient presenting with hypotension with systolics in the 56Y, weakness, dizziness, fatigue. > Found to have AKI with creatinine of 1.68 from baseline of 1-1.2.  No history of CKD. > Does report decreased p.o. intake > Dizziness, slurred speech improving with IV fluids in ED.  Negative neuroimaging with CT and MRI and MRA in the ED. > Received 2 L of fluids in the ED. - Does take antihypertensives including diuretics. - Improving with IV fluids in the ED - Monitor on telemetry - Continue IV fluids - Hold home torsemide, valsartan, hydrochlorothiazide - Trend renal function and electrolytes  Hypertension - Holding home torsemide, valsartan, hydrochlorothiazide in setting of AKI as above  Diabetes > NovolinN 75 units 3 times daily,  NovolinR 75U BID, and NovolinR 65U qhs at home - NovolinN 50 units 3 times daily,  NovolinR 50U BID, and NovolinR 50U qhs - SSI   Hypothyroidism - Continue home Synthroid  Neuropathy - Continue home amitriptyline and Lyrica  Obesity - Noted  DVT prophylaxis: Lovenox Code Status:   Full Family Communication:  Updated at bedside Disposition Plan:   Patient is from:  Home  Anticipated DC to:  Home  Anticipated DC date:  1 to 3 days  Anticipated DC barriers: None  Consults called:  None Admission status:  Observation,  telemetry  Severity of Illness: The appropriate patient status for this patient is OBSERVATION. Observation status is judged to be reasonable and necessary in order to provide the required intensity of service to ensure the patient's safety. The patient's presenting symptoms, physical exam findings, and initial radiographic and laboratory data in the context of their medical condition is felt to place them at decreased risk for further clinical deterioration. Furthermore, it is anticipated that the patient will be medically stable for discharge from the hospital within 2 midnights of admission.  Marcelyn Bruins MD [facility]  How to contact the [facility] Attending or Consulting provider [place] or covering provider during after hours [place], for this patient?   Check the care team in [facility] and look for a) attending/consulting [facility] provider listed and b) the [facility] team listed Log into [email or web address] and use [place] universal password to access. If you do not have the password, please contact the hospital operator. Locate the [facility] provider you are looking for under [facility] and page to a number that you can be directly reached. If you still have difficulty reaching the provider, please page the [facility] (Director on Call) for the Hospitalists listed on amion for assistance.  02/16/2022, 10:16 PM

## 2022-02-16 NOTE — ED Triage Notes (Signed)
Patient presents from primary care MD with dizziness, weakness and hypotension. BP was 86/56. Patient states she is normally hypertensive.

## 2022-02-17 DIAGNOSIS — E039 Hypothyroidism, unspecified: Secondary | ICD-10-CM | POA: Diagnosis not present

## 2022-02-17 DIAGNOSIS — E1169 Type 2 diabetes mellitus with other specified complication: Secondary | ICD-10-CM | POA: Diagnosis not present

## 2022-02-17 DIAGNOSIS — N179 Acute kidney failure, unspecified: Secondary | ICD-10-CM | POA: Diagnosis not present

## 2022-02-17 DIAGNOSIS — E1142 Type 2 diabetes mellitus with diabetic polyneuropathy: Secondary | ICD-10-CM | POA: Diagnosis not present

## 2022-02-17 LAB — COMPREHENSIVE METABOLIC PANEL
ALT: 36 U/L (ref 0–44)
AST: 44 U/L — ABNORMAL HIGH (ref 15–41)
Albumin: 3.3 g/dL — ABNORMAL LOW (ref 3.5–5.0)
Alkaline Phosphatase: 59 U/L (ref 38–126)
Anion gap: 7 (ref 5–15)
BUN: 49 mg/dL — ABNORMAL HIGH (ref 8–23)
CO2: 26 mmol/L (ref 22–32)
Calcium: 8.7 mg/dL — ABNORMAL LOW (ref 8.9–10.3)
Chloride: 107 mmol/L (ref 98–111)
Creatinine, Ser: 1.27 mg/dL — ABNORMAL HIGH (ref 0.44–1.00)
GFR, Estimated: 47 mL/min — ABNORMAL LOW (ref >60)
Glucose, Bld: 302 mg/dL — ABNORMAL HIGH (ref 70–99)
Potassium: 4 mmol/L (ref 3.5–5.1)
Sodium: 140 mmol/L (ref 135–145)
Total Bilirubin: 0.5 mg/dL (ref 0.3–1.2)
Total Protein: 7.2 g/dL (ref 6.5–8.1)

## 2022-02-17 LAB — CBC
HCT: 31.1 % — ABNORMAL LOW (ref 36.0–46.0)
Hemoglobin: 10.5 g/dL — ABNORMAL LOW (ref 12.0–15.0)
MCH: 29.1 pg (ref 26.0–34.0)
MCHC: 33.8 g/dL (ref 30.0–36.0)
MCV: 86.1 fL (ref 80.0–100.0)
Platelets: 166 10*3/uL (ref 150–400)
RBC: 3.61 MIL/uL — ABNORMAL LOW (ref 3.87–5.11)
RDW: 13.2 % (ref 11.5–15.5)
WBC: 6 10*3/uL (ref 4.0–10.5)
nRBC: 0 % (ref 0.0–0.2)

## 2022-02-17 LAB — GLUCOSE, CAPILLARY
Glucose-Capillary: 77 mg/dL (ref 70–99)
Glucose-Capillary: 183 mg/dL — ABNORMAL HIGH (ref 70–99)
Glucose-Capillary: 182 mg/dL — ABNORMAL HIGH (ref 70–99)

## 2022-02-17 LAB — CBG MONITORING, ED
Glucose-Capillary: 299 mg/dL — ABNORMAL HIGH (ref 70–99)
Glucose-Capillary: 239 mg/dL — ABNORMAL HIGH (ref 70–99)

## 2022-02-17 LAB — HIV ANTIBODY (ROUTINE TESTING W REFLEX): HIV Screen 4th Generation wRfx: NONREACTIVE

## 2022-02-17 MED ORDER — AMLODIPINE BESYLATE 5 MG PO TABS
5.0000 mg | ORAL_TABLET | Freq: Every day | ORAL | Status: DC
  Administered 2022-02-17 – 2022-02-18 (×2): 5 mg via ORAL
  Filled 2022-02-17 (×2): qty 1

## 2022-02-17 MED ORDER — AMITRIPTYLINE HCL 25 MG PO TABS
25.0000 mg | ORAL_TABLET | Freq: Once | ORAL | Status: AC
  Administered 2022-02-17: 25 mg via ORAL
  Filled 2022-02-17: qty 1

## 2022-02-17 MED ORDER — INSULIN ASPART 100 UNIT/ML IJ SOLN
50.0000 [IU] | Freq: Three times a day (TID) | INTRAMUSCULAR | Status: DC
Start: 2022-02-17 — End: 2022-02-18
  Administered 2022-02-17 – 2022-02-18 (×3): 50 [IU] via SUBCUTANEOUS
  Filled 2022-02-17: qty 0.5

## 2022-02-17 NOTE — ED Notes (Signed)
PT at bedside.

## 2022-02-17 NOTE — ED Notes (Signed)
I assisted patient to the bathroom

## 2022-02-17 NOTE — ED Notes (Signed)
I assisted patient to the bathroom.

## 2022-02-17 NOTE — Progress Notes (Addendum)
PROGRESS NOTE        PATIENT DETAILS Name: Christy Carpenter Age: 66 y.o. Sex: female Date of Birth: 05/24/1956 Admit Date: 02/16/2022 Admitting Physician Marcelyn Bruins, MD OHY:[id number], Apolonio Schneiders, MD  Brief Summary: Patient is a 66 y.o.  female with history of DM-2, HTN, HLD, hypothyroidism, obesity-who recently had a UTI (initially on Bactrim-followed by Cipro-completed this past Thursday)-referred to the ED from PCPs office for evaluation of hypotension/lightheadedness/dizziness.  See below for further details.   Significant events: 7/3>> admit to [facility] for hypotension-lightheadedness/dizziness-found to have AKI.  Significant studies: 7/3>> CT head: No acute intracranial abnormality. 7/3>> MRI brain: No CVA 7/3>> MRI brain:No major stenosis  Significant microbiology data: None  Procedures:   Consults: None  Subjective: Feels much better but still somewhat lightheaded/woozy when she stands up-but significantly less than yesterday.  Objective: Vitals: Blood pressure 138/81, pulse 91, temperature 97.7 F (36.5 C), temperature source Oral, resp. rate 11, SpO2 94 %.   Exam: Gen Exam:Alert awake-not in any distress HEENT:atraumatic, normocephalic Chest: B/L clear to auscultation anteriorly CVS:S1S2 regular Abdomen:soft non tender, non distended Extremities:no edema Neurology: Non focal Skin: no rash  Pertinent Labs/Radiology:    Latest Ref Rng & Units 02/17/2022    5:50 AM 02/16/2022    4:32 PM 02/16/2022    2:42 PM  CBC  WBC 4.0 - 10.5 K/uL 6.0   5.5   Hemoglobin 12.0 - 15.0 g/dL 10.5  11.9  11.5   Hematocrit 36.0 - 46.0 % 31.1  35.0  33.7   Platelets 150 - 400 K/uL 166   176     Lab Results  Component Value Date   NA 140 02/17/2022   K 4.0 02/17/2022   CL 107 02/17/2022   CO2 26 02/17/2022      Assessment/Plan: Hypotension: Unclear etiology-suspect related to recent UTI/diuretic regimen-no evidence of blood loss-sepsis or cardiac  etiology.  BP better with IVF and holding antihypertensives.    Lightheadedness/dizziness: Due to above-no evidence of CVA on neuroimaging.  Overall improved but still somewhat lightheaded-check orthostatic vital signs.  AKI: Hemodynamically mediated-improving with IVF-stop IVF today-encourage oral intake-recheck electrolytes tomorrow.  HTN: BP improved-given ongoing mild AKI-hold ARB/torsemide/HCTZ-starting low-dose amlodipine and see how she does.  DM-2 (A1c 8.9 on 11/22): Per patient-normally she is on NPH 75 units 3 times daily, and regular insulin 65-75 units 3 times daily.  Follow CBGs-for now continue with NPH 50 units 3 times daily-and NovoLog 50 units 3 times daily.   Recent Labs    02/16/22 2203 02/17/22 0825 02/17/22 1114  GLUCAP 156* 299* 239*    Hypothyroidism: Continue Sythroid  Peripheral neuropathy: Continue Lyrica.  Morbid Obesity: Estimated body mass index is 39.45 kg/m as calculated from the following:   Height as of 09/24/21: 5' (1.524 m).   Weight as of 09/24/21: 91.6 kg.   Code status:   Code Status: Full Code   DVT Prophylaxis: enoxaparin (LOVENOX) injection 40 mg Start: 02/17/22 1000   Family Communication: None at bedside   Disposition Plan: Status is: Observation The patient will require care spanning > 2 midnights and should be moved to inpatient because: Resolving AKI-checking orthostatic vital signs-restarting some antihypertensives-needs another day of hospitalization to ensure stability of labs/BP before consideration of discharge.   Planned Discharge Destination:Home   Diet: Diet Order  Diet heart healthy/carb modified Room service appropriate? Yes; Fluid consistency: Thin  Diet effective now                     Antimicrobial agents: Anti-infectives (From admission, onward)    None        MEDICATIONS: Scheduled Meds:  enoxaparin (LOVENOX) injection  40 mg Subcutaneous Q24H   insulin aspart  0-15 Units  Subcutaneous TID WC   insulin aspart  50 Units Subcutaneous TID WC   insulin NPH Human  50 Units Subcutaneous TID with meals   levothyroxine  50 mcg Oral Q0600   pregabalin  150 mg Oral TID   sodium chloride flush  3 mL Intravenous Q12H   Continuous Infusions: PRN Meds:.acetaminophen **OR** acetaminophen, polyethylene glycol   I have personally reviewed following labs and imaging studies  LABORATORY DATA: CBC: Recent Labs  Lab 02/16/22 1442 02/16/22 1632 02/17/22 0550  WBC 5.5  --  6.0  NEUTROABS 2.3  --   --   HGB 11.5* 11.9* 10.5*  HCT 33.7* 35.0* 31.1*  MCV 86.4  --  86.1  PLT 176  --  546    Basic Metabolic Panel: Recent Labs  Lab 02/16/22 1442 02/16/22 1632 02/17/22 0550  NA 138 137 140  K 4.5 4.4 4.0  CL 98 97* 107  CO2 30  --  26  GLUCOSE 232* 245* 302*  BUN 71* 61* 49*  CREATININE 1.68* 1.80* 1.27*  CALCIUM 9.6  --  8.7*    GFR: CrCl cannot be calculated (Unknown ideal weight.).  Liver Function Tests: Recent Labs  Lab 02/16/22 1442 02/17/22 0550  AST 50* 44*  ALT 40 36  ALKPHOS 66 59  BILITOT 0.5 0.5  PROT 8.3* 7.2  ALBUMIN 3.9 3.3*   No results for input(s): "LIPASE", "AMYLASE" in the last 168 hours. No results for input(s): "AMMONIA" in the last 168 hours.  Coagulation Profile: Recent Labs  Lab 02/16/22 2117  INR 1.1    Cardiac Enzymes: No results for input(s): "CKTOTAL", "CKMB", "CKMBINDEX", "TROPONINI" in the last 168 hours.  BNP (last 3 results) No results for input(s): "PROBNP" in the last 8760 hours.  Lipid Profile: No results for input(s): "CHOL", "HDL", "LDLCALC", "TRIG", "CHOLHDL", "LDLDIRECT" in the last 72 hours.  Thyroid Function Tests: No results for input(s): "TSH", "T4TOTAL", "FREET4", "T3FREE", "THYROIDAB" in the last 72 hours.  Anemia Panel: No results for input(s): "VITAMINB12", "FOLATE", "FERRITIN", "TIBC", "IRON", "RETICCTPCT" in the last 72 hours.  Urine analysis:    Component Value Date/Time    COLORURINE STRAW (A) 02/16/2022 2339   APPEARANCEUR CLEAR 02/16/2022 2339   LABSPEC 1.010 02/16/2022 2339   PHURINE 6.0 02/16/2022 2339   GLUCOSEU NEGATIVE 02/16/2022 2339   HGBUR NEGATIVE 02/16/2022 2339   BILIRUBINUR NEGATIVE 02/16/2022 2339   KETONESUR NEGATIVE 02/16/2022 2339   PROTEINUR NEGATIVE 02/16/2022 2339   UROBILINOGEN 0.2 07/28/2021 1502   NITRITE NEGATIVE 02/16/2022 2339   LEUKOCYTESUR NEGATIVE 02/16/2022 2339    Sepsis Labs: Lactic Acid, Venous    Component Value Date/Time   LATICACIDVEN 3.5 ([place]) 06/24/2020 0636    MICROBIOLOGY: No results found for this or any previous visit (from the past 240 hour(s)).  RADIOLOGY STUDIES/RESULTS: MR BRAIN WO CONTRAST  Result Date: 02/16/2022 CLINICAL DATA:  Acute neurologic deficit EXAM: MRI HEAD WITHOUT CONTRAST MRA HEAD WITHOUT CONTRAST TECHNIQUE: Multiplanar, multi-echo pulse sequences of the brain and surrounding structures were acquired without intravenous contrast. Angiographic images of the Circle of Willis were acquired  using MRA technique without intravenous contrast. COMPARISON:  None Available. FINDINGS: MRI HEAD FINDINGS Brain: No acute infarct, mass effect or extra-axial collection. No acute or chronic hemorrhage. There is multifocal hyperintense T2-weighted signal within the white matter. Parenchymal volume and CSF spaces are normal. The midline structures are normal. Vascular: Major flow voids are preserved. Skull and upper cervical spine: Normal calvarium and skull base. Visualized upper cervical spine and soft tissues are normal. Sinuses/Orbits:No paranasal sinus fluid levels or advanced mucosal thickening. No mastoid or middle ear effusion. Normal orbits. MRA HEAD FINDINGS POSTERIOR CIRCULATION: --Vertebral arteries: Normal --Inferior cerebellar arteries: Normal. --Basilar artery: Normal. --Superior cerebellar arteries: Normal. --Posterior cerebral arteries: Normal. ANTERIOR CIRCULATION: --Intracranial internal carotid  arteries: Normal. --Anterior cerebral arteries (ACA): Normal. --Middle cerebral arteries (MCA): Normal. ANATOMIC VARIANTS: Both P comms are patent. Hypoplastic left A1 segment of the anterior cerebral artery. IMPRESSION: 1. No acute intracranial abnormality. 2. Normal intracranial MRA. 3. Multifocal white matter disease, most consistent with chronic small vessel ischemia. Electronically Signed   By: Ulyses Jarred M.D.   On: 02/16/2022 21:26   MR ANGIO HEAD WO CONTRAST  Result Date: 02/16/2022 CLINICAL DATA:  Acute neurologic deficit EXAM: MRI HEAD WITHOUT CONTRAST MRA HEAD WITHOUT CONTRAST TECHNIQUE: Multiplanar, multi-echo pulse sequences of the brain and surrounding structures were acquired without intravenous contrast. Angiographic images of the Circle of Willis were acquired using MRA technique without intravenous contrast. COMPARISON:  None Available. FINDINGS: MRI HEAD FINDINGS Brain: No acute infarct, mass effect or extra-axial collection. No acute or chronic hemorrhage. There is multifocal hyperintense T2-weighted signal within the white matter. Parenchymal volume and CSF spaces are normal. The midline structures are normal. Vascular: Major flow voids are preserved. Skull and upper cervical spine: Normal calvarium and skull base. Visualized upper cervical spine and soft tissues are normal. Sinuses/Orbits:No paranasal sinus fluid levels or advanced mucosal thickening. No mastoid or middle ear effusion. Normal orbits. MRA HEAD FINDINGS POSTERIOR CIRCULATION: --Vertebral arteries: Normal --Inferior cerebellar arteries: Normal. --Basilar artery: Normal. --Superior cerebellar arteries: Normal. --Posterior cerebral arteries: Normal. ANTERIOR CIRCULATION: --Intracranial internal carotid arteries: Normal. --Anterior cerebral arteries (ACA): Normal. --Middle cerebral arteries (MCA): Normal. ANATOMIC VARIANTS: Both P comms are patent. Hypoplastic left A1 segment of the anterior cerebral artery. IMPRESSION: 1. No  acute intracranial abnormality. 2. Normal intracranial MRA. 3. Multifocal white matter disease, most consistent with chronic small vessel ischemia. Electronically Signed   By: Ulyses Jarred M.D.   On: 02/16/2022 21:26   CT Head Wo Contrast  Result Date: 02/16/2022 CLINICAL DATA:  Mental status change, unknown cause EXAM: CT HEAD WITHOUT CONTRAST TECHNIQUE: Contiguous axial images were obtained from the base of the skull through the vertex without intravenous contrast. RADIATION DOSE REDUCTION: This exam was performed according to the departmental dose-optimization program which includes automated exposure control, adjustment of the mA and/or kV according to patient size and/or use of iterative reconstruction technique. COMPARISON:  None Available. FINDINGS: Brain: No evidence of acute intracranial hemorrhage or extra-axial collection.No evidence of mass lesion/concerning mass effect.The ventricles are normal in size.Scattered subcortical and periventricular white matter hypodensities, nonspecific but likely sequela of chronic small vessel ischemic disease. Vascular: No hyperdense vessel or unexpected calcification. Skull: Normal. Negative for fracture or focal lesion. Sinuses/Orbits: No acute finding. Other: None. IMPRESSION: No acute intracranial abnormality. Mild sequela of chronic small vessel ischemic disease. Electronically Signed   By: Maurine Simmering M.D.   On: 02/16/2022 15:13     LOS: 0 days   Oren Binet, MD  [facility]    To contact the attending provider between 7A-7P or the covering provider during after hours 7P-7A, please log into the web site [email or web address] and access using universal [place] password for that web site. If you do not have the password, please call the hospital operator.  02/17/2022, 1:23 PM

## 2022-02-17 NOTE — Evaluation (Signed)
Physical Therapy Evaluation Patient Details Name: Christy Carpenter MRN: [id number] DOB: May 30, 1956 Today's Date: 02/17/2022  History of Present Illness  Pt is a 66yo female presenting to [facility] ED from PCP's office on 7/3 with hypotension, weakness, dizziness; found to have AKI. Improved with IV fluids in ED. Imaging negative. Recent UTI  PMH: HLD, DM, HTN, GERD, endometrial cancer, peripheral neuropathy, L plantar fasciitis, SIRS.   Clinical Impression  Pt presents with the problems listed above and functional impairments below. Pt reports independence with mobility at baseline though reports she did have a fall on 6/3 she reports secondary to hypertensive episode. Orthostatic vitals taken (see chart below) and were negative, pt reported no symptoms during testing. Pt demonstrated modified independence with transfers and supervision with ambulation in hallway ~38f without device, though did have minor LOB while turning around but was able to self-correct without assistance. Educated pt on pacing of activity and importance of hydration to address hypotension through lifestyle, pt verbalized understanding. Recommended pt use assistive device and pt refused RW but does have SPC at home so strongly [facility] especially given history of peripheral neuropathy and reduced sensation in LLE. Recommending OPPT upon discharge to address balance, reduced activity tolerance, and improve strength. We will continue to follow her acutely to promote safe discharge to the appropriate destination.    02/17/22 1519  Orthostatic Sitting  BP- Sitting 128/71  Pulse- Sitting 85  Orthostatic Standing at 0 minutes  BP- Standing at 0 minutes 131/74  Pulse- Standing at 0 minutes 90  Orthostatic Standing at 3 minutes  BP- Standing at 3 minutes 129/69  Pulse- Standing at 3 minutes 91       Recommendations for follow up therapy are one component of a multi-disciplinary  discharge planning process, led by the attending physician.  Recommendations may be updated based on patient status, additional functional criteria and insurance authorization.  Follow Up Recommendations Outpatient PT      Assistance Recommended at Discharge Set up Supervision/Assistance  Patient can return home with the following  A little help with walking and/or transfers;A little help with bathing/dressing/bathroom;Assistance with cooking/housework;Assist for transportation;Help with stairs or ramp for entrance    Equipment Recommendations None recommended by PT  Recommendations for Other Services       Functional Status Assessment Patient has had a recent decline in their functional status and demonstrates the ability to make significant improvements in function in a reasonable and predictable amount of time.     Precautions / Restrictions Precautions Precautions: None Restrictions Weight Bearing Restrictions: No      Mobility  Bed Mobility               General bed mobility comments: Pt in recliner at entry and exit    Transfers Overall transfer level: Modified independent Equipment used: None               General transfer comment: increased time    Ambulation/Gait Ambulation/Gait assistance: Supervision Gait Distance (Feet): 60 Feet Assistive device: None Gait Pattern/deviations: WFL(Within Functional Limits) Gait velocity: decreased     General Gait Details: Pt ambulated with supervision ~659fwithout physical assist; mild LOB when turning around but pt able to self-correct without assistance.  Stairs            Wheelchair Mobility    Modified Rankin (Stroke Patients Only)       Balance Overall balance assessment: Mild deficits observed, not formally  tested                                           Pertinent Vitals/Pain Pain Assessment Pain Assessment: No/denies pain    Home Living Family/patient expects to  be discharged to:: Private residence Living Arrangements: Spouse/significant other Available Help at Discharge: Family;Available 24 hours/day Type of Home: House Home Access: Stairs to enter Entrance Stairs-Rails: Can reach both;Left;Right Entrance Stairs-Number of Steps: 3 Alternate Level Stairs-Number of Steps: 12 Home Layout: Two level Home Equipment: Cane - single point;Grab bars - tub/shower      Prior Function Prior Level of Function : Independent/Modified Independent;History of Falls (last six months)             Mobility Comments: IND, recent fall on 6/3, pt reports secondary to high BP. ADLs Comments: Husband assists with entry/exit of tub     Hand Dominance        Extremity/Trunk Assessment   Upper Extremity Assessment Upper Extremity Assessment: Overall WFL for tasks assessed    Lower Extremity Assessment Lower Extremity Assessment: RLE deficits/detail;LLE deficits/detail RLE Deficits / Details: Gross MMT 4+/5 RLE Sensation: WNL LLE Deficits / Details: Gross MMT 3+/5, especially hip flexion. LLE Sensation: history of peripheral neuropathy;decreased light touch (Pt reports decreased sensation on LLE from plantar surface of foot through hip.)    Cervical / Trunk Assessment Cervical / Trunk Assessment: Normal  Communication   Communication: No difficulties  Cognition Arousal/Alertness: Awake/alert Behavior During Therapy: WFL for tasks assessed/performed Overall Cognitive Status: Within Functional Limits for tasks assessed                                 General Comments: Pt pleasant with mildly slurred speech but reports this is baseline and just how she talks.        General Comments      Exercises     Assessment/Plan    PT Assessment Patient needs continued PT services  PT Problem List Decreased strength;Decreased range of motion;Decreased activity tolerance;Decreased balance;Decreased mobility;Decreased coordination;Decreased  knowledge of use of DME;Pain       PT Treatment Interventions DME instruction;Gait training;Stair training;Functional mobility training;Therapeutic activities;Therapeutic exercise;Balance training;Patient/family education;Neuromuscular re-education    PT Goals (Current goals can be found in the Care Plan section)  Acute Rehab PT Goals Patient Stated Goal: To go home PT Goal Formulation: With patient Time For Goal Achievement: 03/03/22 Potential to Achieve Goals: Good    Frequency Min 3X/week     Co-evaluation               AM-PAC PT "6 Clicks" Mobility  Outcome Measure Help needed turning from your back to your side while in a flat bed without using bedrails?: None Help needed moving from lying on your back to sitting on the side of a flat bed without using bedrails?: A Little Help needed moving to and from a bed to a chair (including a wheelchair)?: A Little Help needed standing up from a chair using your arms (e.g., wheelchair or bedside chair)?: A Little Help needed to walk in hospital room?: A Little Help needed climbing 3-5 steps with a railing? : A Lot 6 Click Score: 18    End of Session   Activity Tolerance: Patient tolerated treatment well;No increased pain Patient left: in chair;with call bell/phone within  reach Nurse Communication: Mobility status;Other (comment) (Vitals) PT Visit Diagnosis: Difficulty in walking, not elsewhere classified (R26.2);Muscle weakness (generalized) (M62.81)    Time: 5750-5183 PT Time Calculation (min) (ACUTE ONLY): 34 min   Charges:   PT Evaluation $PT Eval Low Complexity: 1 Low PT Treatments $Gait Training: 8-22 mins        Coolidge Breeze, PT, DPT [place] [facility] Office: [phone] Pager: [phone]  Coolidge Breeze 02/17/2022, 3:30 PM

## 2022-02-17 NOTE — Progress Notes (Incomplete)
   02/17/22 1519  Orthostatic Sitting  BP- Sitting 128/71  Pulse- Sitting 85  Orthostatic Standing at 0 minutes  BP- Standing at 0 minutes 131/74  Pulse- Standing at 0 minutes 90  Orthostatic Standing at 3 minutes  BP- Standing at 3 minutes 129/69  Pulse- Standing at 3 minutes 91

## 2022-02-18 DIAGNOSIS — N179 Acute kidney failure, unspecified: Secondary | ICD-10-CM | POA: Diagnosis not present

## 2022-02-18 LAB — BASIC METABOLIC PANEL
Anion gap: 8 (ref 5–15)
BUN: 28 mg/dL — ABNORMAL HIGH (ref 8–23)
CO2: 26 mmol/L (ref 22–32)
Calcium: 9.7 mg/dL (ref 8.9–10.3)
Chloride: 108 mmol/L (ref 98–111)
Creatinine, Ser: 1.03 mg/dL — ABNORMAL HIGH (ref 0.44–1.00)
GFR, Estimated: >60 mL/min (ref >60)
Glucose, Bld: 101 mg/dL — ABNORMAL HIGH (ref 70–99)
Potassium: 4.4 mmol/L (ref 3.5–5.1)
Sodium: 142 mmol/L (ref 135–145)

## 2022-02-18 LAB — GLUCOSE, CAPILLARY
Glucose-Capillary: 81 mg/dL (ref 70–99)
Glucose-Capillary: 118 mg/dL — ABNORMAL HIGH (ref 70–99)

## 2022-02-18 MED ORDER — AMLODIPINE BESYLATE 5 MG PO TABS: 5.0000 mg | ORAL_TABLET | Freq: Every day | ORAL | 3 refills | Status: DC

## 2022-02-18 NOTE — Evaluation (Signed)
Occupational Therapy Evaluation Patient Details Name: Christy Carpenter MRN: [id number] DOB: 1956/03/04 Today's Date: 02/18/2022   History of Present Illness Pt is a 66yo female presenting to [facility] ED from PCP's office on 7/3 with hypotension, weakness, dizziness; found to have AKI. Improved with IV fluids in ED. Imaging negative. Recent UTI  PMH: HLD, DM, HTN, GERD, endometrial cancer, peripheral neuropathy, L plantar fasciitis, SIRS.   Clinical Impression   Pt admitted with the above diagnoses. At baseline, pt reports she is independent with ADLs, mod I with tub shower transfers. Pt reports feeling better today than yesterday. Tolerated OOB activity well. Noted to seek single UE support vs reduce speed on turns. Pt with good awareness of current limitations to her balance. Pt appears to be at/close to baseline with ADLs. Will sign off OT services.        Recommendations for follow up therapy are one component of a multi-disciplinary discharge planning process, led by the attending physician.  Recommendations may be updated based on patient status, additional functional criteria and insurance authorization.   Follow Up Recommendations  No OT follow up    Assistance Recommended at Discharge PRN  Patient can return home with the following      Functional Status Assessment  Patient has not had a recent decline in their functional status  Equipment Recommendations  None recommended by OT    Recommendations for Other Services       Precautions / Restrictions Precautions Precautions: None Restrictions Weight Bearing Restrictions: No      Mobility Bed Mobility Overal bed mobility: Independent                  Transfers Overall transfer level: Modified independent Equipment used: None                      Balance Overall balance assessment: Mild deficits observed, not formally tested                                         ADL either performed or  assessed with clinical judgement   ADL Overall ADL's : Needs assistance/impaired Eating/Feeding: Independent   Grooming: Independent   Upper Body Bathing: Independent   Lower Body Bathing: Sit to/from stand;Modified independent   Upper Body Dressing : Independent   Lower Body Dressing: Sit to/from stand;Modified independent   Toilet Transfer: Modified Independent   Toileting- Clothing Manipulation and Hygiene: Modified independent   Tub/ Shower Transfer: Tub transfer;Supervision/safety   Functional mobility during ADLs: Modified independent General ADL Comments: Pt with good awareness of limitations to her balance. Seeks intermittent external support of furniture and/or slows speed on turns.     Vision Baseline Vision/History: 1 Wears glasses       Perception     Praxis      Pertinent Vitals/Pain Pain Assessment Pain Assessment: No/denies pain     Hand Dominance     Extremity/Trunk Assessment Upper Extremity Assessment Upper Extremity Assessment: Overall WFL for tasks assessed   Lower Extremity Assessment Lower Extremity Assessment: Defer to PT evaluation   Cervical / Trunk Assessment Cervical / Trunk Assessment: Normal   Communication Communication Communication: No difficulties   Cognition Arousal/Alertness: Awake/alert Behavior During Therapy: WFL for tasks assessed/performed Overall Cognitive Status: Within Functional Limits for tasks assessed  General Comments: mildly slurred speech, pt reports this was part of PCP decision to send her to ED     General Comments  Spouse present.    Exercises     Shoulder Instructions      Home Living Family/patient expects to be discharged to:: Private residence Living Arrangements: Spouse/significant other Available Help at Discharge: Family;Available 24 hours/day Type of Home: House Home Access: Stairs to enter [employer] of Steps: 3 Entrance  Stairs-Rails: Can reach both;Left;Right Home Layout: Two level Alternate Level Stairs-Number of Steps: 12 Alternate Level Stairs-Rails: Left Bathroom Shower/Tub: Walk-in shower;Tub/shower unit   Bathroom Toilet: Standard     Home Equipment: Cane - single point;Grab bars - tub/shower          Prior Functioning/Environment Prior Level of Function : Independent/Modified Independent;History of Falls (last six months)             Mobility Comments: IND, recent fall on 6/3, pt reports secondary to high BP. ADLs Comments: Husband assists with entry/exit of tub        OT Problem List:        OT Treatment/Interventions:      OT Goals(Current goals can be found in the care plan section) Acute Rehab OT Goals Patient Stated Goal: home today  OT Frequency:      Co-evaluation              AM-PAC OT "6 Clicks" Daily Activity     Outcome Measure Help from another person eating meals?: None Help from another person taking care of personal grooming?: None Help from another person toileting, which includes using toliet, bedpan, or urinal?: None Help from another person bathing (including washing, rinsing, drying)?: None Help from another person to put on and taking off regular upper body clothing?: None Help from another person to put on and taking off regular lower body clothing?: None 6 Click Score: 24   End of Session    Activity Tolerance: Patient tolerated treatment well Patient left: with call bell/phone within reach;with family/visitor present;Other (comment) (spouse present)  OT Visit Diagnosis: Unsteadiness on feet (R26.81)                Time: 2094-7096 OT Time Calculation (min): 8 min Charges:  OT General Charges $OT Visit: 1 Visit OT Evaluation $OT Eval Low Complexity: [place], OT Acute Rehabilitation Services Office: [phone]   Hortencia Pilar 02/18/2022, 9:45 AM

## 2022-02-18 NOTE — TOC Transition Note (Signed)
Transition of Care [facility]) - CM/SW Discharge Note   Patient Details  Name: Christy Carpenter MRN: [id number] Date of Birth: Nov 21, 1955  Transition of Care [facility]) CM/SW Contact:  Leeroy Cha, RN Phone Number: 02/18/2022, 2:36 PM   Clinical Narrative:     Dcd to home with self care.orders checked for toc needs.    Barriers to Discharge: Continued Medical Work up   Patient Goals and CMS Choice Patient states their goals for this hospitalization and ongoing recovery are:: to go home CMS Medicare.gov Compare Post Acute Care list provided to:: Patient    Discharge Placement                       Discharge Plan and Services   Discharge Planning Services: CM Consult                                 Social Determinants of Health (SDOH) Interventions     Readmission Risk Interventions     No data to display

## 2022-02-18 NOTE — Plan of Care (Signed)
  Problem: Nutritional: Goal: Maintenance of adequate nutrition will improve Outcome: Adequate for Discharge   Problem: Metabolic: Goal: Ability to maintain appropriate glucose levels will improve Outcome: Adequate for Discharge   Problem: Education: Goal: Knowledge of General Education information will improve Description: Including pain rating scale, medication(s)/side effects and non-pharmacologic comfort measures Outcome: Adequate for Discharge   Problem: Activity: Goal: Risk for activity intolerance will decrease Outcome: Adequate for Discharge

## 2022-02-18 NOTE — TOC Initial Note (Signed)
Transition of Care [facility]) - Initial/Assessment Note    Patient Details  Name: Christy Carpenter MRN: [id number] Date of Birth: 1956/03/11  [facility] Care [facility]) CM/SW Contact:    Leeroy Cha, RN Phone Number: 02/18/2022, 7:52 AM  Clinical Narrative:                  Transition of Care [facility]) Screening Note   Patient Details  Name: Christy Carpenter Date of Birth: [birth year]   Transition of Care [facility]) CM/SW Contact:    Leeroy Cha, RN Phone Number: 02/18/2022, 7:52 AM    Transition of Care Department [facility]) has reviewed patient and no TOC needs have been identified at this time. We will continue to monitor patient advancement through interdisciplinary progression rounds. If new patient transition needs arise, please place a TOC consult.    Expected Discharge Plan: Home/Self Care Barriers to Discharge: Continued Medical Work up   Patient Goals and CMS Choice Patient states their goals for this hospitalization and ongoing recovery are:: to go home CMS Medicare.gov Compare Post Acute Care list provided to:: Patient    Expected Discharge Plan and Services Expected Discharge Plan: Home/Self Care   Discharge Planning Services: CM Consult   Living arrangements for the past 2 months: Single Family Home                                      Prior Living Arrangements/Services Living arrangements for the past 2 months: Single Family Home Lives with:: Spouse Patient language and need for interpreter reviewed:: Yes Do you feel safe going back to the place where you live?: Yes            Criminal Activity/Legal Involvement Pertinent to Current Situation/Hospitalization: No - Comment as needed  Activities of Daily Living Home Assistive Devices/Equipment: None ADL Screening (condition at time of admission) Patient's cognitive ability adequate to safely complete daily activities?: Yes Is the patient deaf or have difficulty hearing?: No Does the patient have  difficulty seeing, even when wearing glasses/contacts?: No Does the patient have difficulty concentrating, remembering, or making decisions?: No Patient able to express need for assistance with ADLs?: Yes Does the patient have difficulty dressing or bathing?: No Independently performs ADLs?: Yes (appropriate for developmental age) Does the patient have difficulty walking or climbing stairs?: No Weakness of Legs: None Weakness of Arms/Hands: None  Permission Sought/Granted                  Emotional Assessment Appearance:: Appears stated age     Orientation: : Oriented to  Time, Oriented to Place, Oriented to Self, Oriented to Situation Alcohol / Substance Use: Never Used Psych Involvement: No (comment)  Admission diagnosis:  AKI (acute kidney injury) ([place]) [N17.9] Hypotension, unspecified hypotension type [I95.9] Patient Active Problem List   Diagnosis Date Noted   AKI (acute kidney injury) ([place]) 02/16/2022   Low HDL (under 40) 06/19/2021   Vitamin D deficiency 06/19/2021   Sickle cell trait ([place]) 06/19/2021   Hypothyroidism    Gastroesophageal reflux disease    Type 2 diabetes mellitus with hyperlipidemia ([place])    Endometrial adenocarcinoma ([place]) 06/20/2020   Endometriosis    Retroperitoneal fibrosis    Diabetic neuropathy ([place]) 02/23/2020   Endometrial cancer ([place]) 02/23/2020   Obesity (BMI 30.0-34.9) 02/23/2020   Uncontrolled type 2 diabetes mellitus with hyperglycemia ([place]) 08/19/2017   Essential hypertension 08/19/2017  Hypercholesterolemia    PCP:  [name], MD Pharmacy:   [place] [place] [address] Phone: [phone] Fax: [phone]     Social Determinants of Health (SDOH) Interventions    Readmission Risk Interventions     No data to display

## 2022-02-18 NOTE — Discharge Summary (Signed)
Physician Discharge Summary  Christy Carpenter:[id number] DOB: 04-14-1956 DOA: 02/16/2022  PCP: [name], MD  Admit date: 02/16/2022 Discharge date: 02/18/2022  Time spent: 27 minutes  Recommendations for Outpatient Follow-up:  Needs Chem-12, CBC 1 week Outpatient surveillance with gynecology oncology for prior uterine cancer Started amlodipine 5 this admission and discontinued valsartan HCT as well as torsemide  Discharge Diagnoses:  MAIN problem for hospitalization   AKI on admission  Please see below for itemized issues addressed in HOpsital- refer to other progress notes for clarity if needed  Discharge diet-heart healthy   Condition: Good   Filed Weights   02/18/22 0613  Weight: 92.2 kg    History of present illness:  66 year old black female known DM TY 2 HTN HLD hypothyroid recent UTI (Rx Bactrim recently) Prior uterine cancer status post hysterectomy Came to ED after being referred from PCP office for significant dizziness AKI found-all imaging negative   Hospital Course:  Hypotension on admission AKI likely related to continued use of meds Diuretics, antihypertensives and Bactrim causing AKI Much improved on discharge no orthostatic drop Stable for discharge Prior uterine cancer Needs outpatient screening and monitoring DM TY 2 A1c 8.9 06/2019 On specialized regimen CBGs were stable on lower dosing of insulin-it is anticipated that CBGs will increase She follows with endocrinology and with bariatrics-outpatient follow-up recommended HTN Multiple meds discontinued-started on amlodipine 5 this admission and will need outpatient close follow-up   Awake coherent no distress  Discharge Exam: Vitals:   02/18/22 0034 02/18/22 0609  BP: 138/72 (!) 151/78  Pulse: 94 87  Resp: 16 18  Temp: 98.2 F (36.8 C) 98.1 F (36.7 C)  SpO2: 100% 98%    Subj on day of d/c   Well no distress not dizzy  General Exam on discharge  Eomi ncat no focal deficit S1  s2 no m/r/g Abd soft nt nd no rebound no guard Cta b no rales rhonchi Neuro power intact  Discharge Instructions   Discharge Instructions     Diet - low sodium heart healthy   Complete by: As directed    Discharge instructions   Complete by: As directed    Continue all of your medications other than your torsemide and valsartan HCTZ combination with no change Recommend that you follow-up in the outpatient setting with primary care and make some adjustments to some of your blood pressure medications as you may need more Please start new medication called amlodipine and this be started at low-dose and this can be increased in the outpatient setting Please continue to take your insulin in your usual way and follow-up with your endocrinologist as they have you on a specialized regimen that you should resume which keeps you under control-please report any significant dose if you have more than 2 or 3 of these to your doctor   Increase activity slowly   Complete by: As directed       Allergies as of 02/18/2022       Reactions   Byetta 10 Mcg Pen [exenatide] Itching   Dapagliflozin Other (See Comments)   Yeast infections   Januvia [sitagliptin]    Yeast infections    Invokana [canagliflozin] Other (See Comments)   Yeast infections        Medication List     STOP taking these medications    amitriptyline 50 MG tablet Commonly known as: ELAVIL   torsemide 20 MG tablet Commonly known as: DEMADEX   valsartan-hydrochlorothiazide 320-25 MG tablet Commonly known as:  DIOVAN-HCT   Vitamin D (Ergocalciferol) 1.25 MG (50000 UNIT) Caps capsule Commonly known as: DRISDOL       TAKE these medications    amLODipine 5 MG tablet Commonly known as: NORVASC Take 1 tablet (5 mg total) by mouth daily. Start taking on: February 19, 2022   FreeStyle Libre 2 Sensor Misc   levothyroxine 50 MCG tablet Commonly known as: SYNTHROID Take 50 mcg by mouth daily before breakfast.   NovoLIN N  FlexPen 100 UNIT/ML Kiwkpen Generic drug: Insulin NPH (Human) (Isophane) Inject 75 Units into the skin 3 (three) times daily.   NovoLIN R FlexPen ReliOn 100 UNIT/ML KwikPen Generic drug: Insulin Regular Human Inject 65-75 Units into the skin 3 (three) times daily. Inject 75 units BID & Inject 65 units at bedtime   Ozempic (2 MG/DOSE) 8 MG/3ML Sopn Generic drug: Semaglutide (2 MG/DOSE) Inject 2 mg into the skin once a week.   pregabalin 150 MG capsule Commonly known as: LYRICA Take 150 mg by mouth 3 (three) times daily.       Allergies  Allergen Reactions   Byetta 10 Mcg Pen [Exenatide] Itching   Dapagliflozin Other (See Comments)    Yeast infections   Januvia [Sitagliptin]     Yeast infections    Invokana [Canagliflozin] Other (See Comments)    Yeast infections      The results of significant diagnostics from this hospitalization (including imaging, microbiology, ancillary and laboratory) are listed below for reference.    Significant Diagnostic Studies: MR BRAIN WO CONTRAST  Result Date: 02/16/2022 CLINICAL DATA:  Acute neurologic deficit EXAM: MRI HEAD WITHOUT CONTRAST MRA HEAD WITHOUT CONTRAST TECHNIQUE: Multiplanar, multi-echo pulse sequences of the brain and surrounding structures were acquired without intravenous contrast. Angiographic images of the Circle of Willis were acquired using MRA technique without intravenous contrast. COMPARISON:  None Available. FINDINGS: MRI HEAD FINDINGS Brain: No acute infarct, mass effect or extra-axial collection. No acute or chronic hemorrhage. There is multifocal hyperintense T2-weighted signal within the white matter. Parenchymal volume and CSF spaces are normal. The midline structures are normal. Vascular: Major flow voids are preserved. Skull and upper cervical spine: Normal calvarium and skull base. Visualized upper cervical spine and soft tissues are normal. Sinuses/Orbits:No paranasal sinus fluid levels or advanced mucosal  thickening. No mastoid or middle ear effusion. Normal orbits. MRA HEAD FINDINGS POSTERIOR CIRCULATION: --Vertebral arteries: Normal --Inferior cerebellar arteries: Normal. --Basilar artery: Normal. --Superior cerebellar arteries: Normal. --Posterior cerebral arteries: Normal. ANTERIOR CIRCULATION: --Intracranial internal carotid arteries: Normal. --Anterior cerebral arteries (ACA): Normal. --Middle cerebral arteries (MCA): Normal. ANATOMIC VARIANTS: Both P comms are patent. Hypoplastic left A1 segment of the anterior cerebral artery. IMPRESSION: 1. No acute intracranial abnormality. 2. Normal intracranial MRA. 3. Multifocal white matter disease, most consistent with chronic small vessel ischemia. Electronically Signed   By: Ulyses Jarred M.D.   On: 02/16/2022 21:26   MR ANGIO HEAD WO CONTRAST  Result Date: 02/16/2022 CLINICAL DATA:  Acute neurologic deficit EXAM: MRI HEAD WITHOUT CONTRAST MRA HEAD WITHOUT CONTRAST TECHNIQUE: Multiplanar, multi-echo pulse sequences of the brain and surrounding structures were acquired without intravenous contrast. Angiographic images of the Circle of Willis were acquired using MRA technique without intravenous contrast. COMPARISON:  None Available. FINDINGS: MRI HEAD FINDINGS Brain: No acute infarct, mass effect or extra-axial collection. No acute or chronic hemorrhage. There is multifocal hyperintense T2-weighted signal within the white matter. Parenchymal volume and CSF spaces are normal. The midline structures are normal. Vascular: Major flow voids are preserved. Skull and  upper cervical spine: Normal calvarium and skull base. Visualized upper cervical spine and soft tissues are normal. Sinuses/Orbits:No paranasal sinus fluid levels or advanced mucosal thickening. No mastoid or middle ear effusion. Normal orbits. MRA HEAD FINDINGS POSTERIOR CIRCULATION: --Vertebral arteries: Normal --Inferior cerebellar arteries: Normal. --Basilar artery: Normal. --Superior cerebellar arteries:  Normal. --Posterior cerebral arteries: Normal. ANTERIOR CIRCULATION: --Intracranial internal carotid arteries: Normal. --Anterior cerebral arteries (ACA): Normal. --Middle cerebral arteries (MCA): Normal. ANATOMIC VARIANTS: Both P comms are patent. Hypoplastic left A1 segment of the anterior cerebral artery. IMPRESSION: 1. No acute intracranial abnormality. 2. Normal intracranial MRA. 3. Multifocal white matter disease, most consistent with chronic small vessel ischemia. Electronically Signed   By: Ulyses Jarred M.D.   On: 02/16/2022 21:26   CT Head Wo Contrast  Result Date: 02/16/2022 CLINICAL DATA:  Mental status change, unknown cause EXAM: CT HEAD WITHOUT CONTRAST TECHNIQUE: Contiguous axial images were obtained from the base of the skull through the vertex without intravenous contrast. RADIATION DOSE REDUCTION: This exam was performed according to the departmental dose-optimization program which includes automated exposure control, adjustment of the mA and/or kV according to patient size and/or use of iterative reconstruction technique. COMPARISON:  None Available. FINDINGS: Brain: No evidence of acute intracranial hemorrhage or extra-axial collection.No evidence of mass lesion/concerning mass effect.The ventricles are normal in size.Scattered subcortical and periventricular white matter hypodensities, nonspecific but likely sequela of chronic small vessel ischemic disease. Vascular: No hyperdense vessel or unexpected calcification. Skull: Normal. Negative for fracture or focal lesion. Sinuses/Orbits: No acute finding. Other: None. IMPRESSION: No acute intracranial abnormality. Mild sequela of chronic small vessel ischemic disease. Electronically Signed   By: Maurine Simmering M.D.   On: 02/16/2022 15:13    Microbiology: No results found for this or any previous visit (from the past 240 hour(s)).   Labs: Basic Metabolic Panel: Recent Labs  Lab 02/16/22 1442 02/16/22 1632 02/17/22 0550 02/18/22 0403  NA  138 137 140 142  K 4.5 4.4 4.0 4.4  CL 98 97* 107 108  CO2 30  --  26 26  GLUCOSE 232* 245* 302* 101*  BUN 71* 61* 49* 28*  CREATININE 1.68* 1.80* 1.27* 1.03*  CALCIUM 9.6  --  8.7* 9.7   Liver Function Tests: Recent Labs  Lab 02/16/22 1442 02/17/22 0550  AST 50* 44*  ALT 40 36  ALKPHOS 66 59  BILITOT 0.5 0.5  PROT 8.3* 7.2  ALBUMIN 3.9 3.3*   No results for input(s): "LIPASE", "AMYLASE" in the last 168 hours. No results for input(s): "AMMONIA" in the last 168 hours. CBC: Recent Labs  Lab 02/16/22 1442 02/16/22 1632 02/17/22 0550  WBC 5.5  --  6.0  NEUTROABS 2.3  --   --   HGB 11.5* 11.9* 10.5*  HCT 33.7* 35.0* 31.1*  MCV 86.4  --  86.1  PLT 176  --  166   Cardiac Enzymes: No results for input(s): "CKTOTAL", "CKMB", "CKMBINDEX", "TROPONINI" in the last 168 hours. BNP: BNP (last 3 results) Recent Labs    06/15/21 2215  BNP 12.7    ProBNP (last 3 results) No results for input(s): "PROBNP" in the last 8760 hours.  CBG: Recent Labs  Lab 02/17/22 1628 02/17/22 1832 02/17/22 2015 02/18/22 0859 02/18/22 1204  GLUCAP 77 183* 182* 81 118*       Signed:  Nita Sells MD   [facility] 02/18/2022, 1:09 PM

## 2022-03-25 ENCOUNTER — Encounter (INDEPENDENT_AMBULATORY_CARE_PROVIDER_SITE_OTHER): Payer: Self-pay

## 2022-04-14 ENCOUNTER — Other Ambulatory Visit: Payer: Self-pay | Admitting: Gastroenterology

## 2022-04-14 DIAGNOSIS — R1310 Dysphagia, unspecified: Secondary | ICD-10-CM

## 2022-12-16 DIAGNOSIS — E785 Hyperlipidemia, unspecified: Secondary | ICD-10-CM | POA: Diagnosis not present

## 2022-12-16 DIAGNOSIS — R609 Edema, unspecified: Secondary | ICD-10-CM | POA: Diagnosis not present

## 2022-12-16 DIAGNOSIS — N1831 Chronic kidney disease, stage 3a: Secondary | ICD-10-CM | POA: Diagnosis not present

## 2022-12-16 DIAGNOSIS — E1122 Type 2 diabetes mellitus with diabetic chronic kidney disease: Secondary | ICD-10-CM | POA: Diagnosis not present

## 2022-12-16 DIAGNOSIS — N1832 Chronic kidney disease, stage 3b: Secondary | ICD-10-CM | POA: Diagnosis not present

## 2022-12-16 DIAGNOSIS — I129 Hypertensive chronic kidney disease with stage 1 through stage 4 chronic kidney disease, or unspecified chronic kidney disease: Secondary | ICD-10-CM | POA: Diagnosis not present

## 2022-12-17 DIAGNOSIS — E7800 Pure hypercholesterolemia, unspecified: Secondary | ICD-10-CM | POA: Diagnosis not present

## 2022-12-17 DIAGNOSIS — C55 Malignant neoplasm of uterus, part unspecified: Secondary | ICD-10-CM | POA: Diagnosis not present

## 2022-12-17 DIAGNOSIS — D72819 Decreased white blood cell count, unspecified: Secondary | ICD-10-CM | POA: Diagnosis not present

## 2022-12-17 DIAGNOSIS — G609 Hereditary and idiopathic neuropathy, unspecified: Secondary | ICD-10-CM | POA: Diagnosis not present

## 2022-12-17 DIAGNOSIS — E1165 Type 2 diabetes mellitus with hyperglycemia: Secondary | ICD-10-CM | POA: Diagnosis not present

## 2022-12-17 DIAGNOSIS — I10 Essential (primary) hypertension: Secondary | ICD-10-CM | POA: Diagnosis not present

## 2022-12-17 DIAGNOSIS — E039 Hypothyroidism, unspecified: Secondary | ICD-10-CM | POA: Diagnosis not present

## 2023-01-13 DIAGNOSIS — M461 Sacroiliitis, not elsewhere classified: Secondary | ICD-10-CM | POA: Diagnosis not present

## 2023-01-21 DIAGNOSIS — M461 Sacroiliitis, not elsewhere classified: Secondary | ICD-10-CM | POA: Diagnosis not present

## 2023-02-10 DIAGNOSIS — Z6841 Body Mass Index (BMI) 40.0 and over, adult: Secondary | ICD-10-CM | POA: Diagnosis not present

## 2023-02-10 DIAGNOSIS — I10 Essential (primary) hypertension: Secondary | ICD-10-CM | POA: Diagnosis not present

## 2023-02-10 DIAGNOSIS — R8290 Unspecified abnormal findings in urine: Secondary | ICD-10-CM | POA: Diagnosis not present

## 2023-02-10 DIAGNOSIS — R490 Dysphonia: Secondary | ICD-10-CM | POA: Diagnosis not present

## 2023-03-19 DIAGNOSIS — R69 Illness, unspecified: Secondary | ICD-10-CM | POA: Diagnosis not present

## 2023-03-25 DIAGNOSIS — I700 Atherosclerosis of aorta: Secondary | ICD-10-CM | POA: Diagnosis not present

## 2023-03-25 DIAGNOSIS — N1832 Chronic kidney disease, stage 3b: Secondary | ICD-10-CM | POA: Diagnosis not present

## 2023-03-25 DIAGNOSIS — E1165 Type 2 diabetes mellitus with hyperglycemia: Secondary | ICD-10-CM | POA: Diagnosis not present

## 2023-03-25 DIAGNOSIS — E7800 Pure hypercholesterolemia, unspecified: Secondary | ICD-10-CM | POA: Diagnosis not present

## 2023-03-25 DIAGNOSIS — I10 Essential (primary) hypertension: Secondary | ICD-10-CM | POA: Diagnosis not present

## 2023-03-25 DIAGNOSIS — E1142 Type 2 diabetes mellitus with diabetic polyneuropathy: Secondary | ICD-10-CM | POA: Diagnosis not present

## 2023-03-25 DIAGNOSIS — E039 Hypothyroidism, unspecified: Secondary | ICD-10-CM | POA: Diagnosis not present

## 2023-03-25 DIAGNOSIS — Z794 Long term (current) use of insulin: Secondary | ICD-10-CM | POA: Diagnosis not present

## 2023-04-07 DIAGNOSIS — R69 Illness, unspecified: Secondary | ICD-10-CM | POA: Diagnosis not present

## 2023-04-20 DIAGNOSIS — E7800 Pure hypercholesterolemia, unspecified: Secondary | ICD-10-CM | POA: Diagnosis not present

## 2023-04-20 DIAGNOSIS — I10 Essential (primary) hypertension: Secondary | ICD-10-CM | POA: Diagnosis not present

## 2023-04-20 DIAGNOSIS — E1165 Type 2 diabetes mellitus with hyperglycemia: Secondary | ICD-10-CM | POA: Diagnosis not present

## 2023-04-20 DIAGNOSIS — E039 Hypothyroidism, unspecified: Secondary | ICD-10-CM | POA: Diagnosis not present

## 2023-04-26 DIAGNOSIS — M461 Sacroiliitis, not elsewhere classified: Secondary | ICD-10-CM | POA: Diagnosis not present

## 2023-04-27 DIAGNOSIS — I10 Essential (primary) hypertension: Secondary | ICD-10-CM | POA: Diagnosis not present

## 2023-04-27 DIAGNOSIS — E7800 Pure hypercholesterolemia, unspecified: Secondary | ICD-10-CM | POA: Diagnosis not present

## 2023-04-27 DIAGNOSIS — D72819 Decreased white blood cell count, unspecified: Secondary | ICD-10-CM | POA: Diagnosis not present

## 2023-04-27 DIAGNOSIS — G609 Hereditary and idiopathic neuropathy, unspecified: Secondary | ICD-10-CM | POA: Diagnosis not present

## 2023-04-27 DIAGNOSIS — C55 Malignant neoplasm of uterus, part unspecified: Secondary | ICD-10-CM | POA: Diagnosis not present

## 2023-04-27 DIAGNOSIS — E1165 Type 2 diabetes mellitus with hyperglycemia: Secondary | ICD-10-CM | POA: Diagnosis not present

## 2023-04-27 DIAGNOSIS — N1832 Chronic kidney disease, stage 3b: Secondary | ICD-10-CM | POA: Diagnosis not present

## 2023-04-27 DIAGNOSIS — E039 Hypothyroidism, unspecified: Secondary | ICD-10-CM | POA: Diagnosis not present

## 2023-05-24 DIAGNOSIS — Z6841 Body Mass Index (BMI) 40.0 and over, adult: Secondary | ICD-10-CM | POA: Diagnosis not present

## 2023-05-24 DIAGNOSIS — M461 Sacroiliitis, not elsewhere classified: Secondary | ICD-10-CM | POA: Diagnosis not present

## 2023-06-02 DIAGNOSIS — Z6841 Body Mass Index (BMI) 40.0 and over, adult: Secondary | ICD-10-CM | POA: Diagnosis not present

## 2023-06-02 DIAGNOSIS — M461 Sacroiliitis, not elsewhere classified: Secondary | ICD-10-CM | POA: Diagnosis not present

## 2023-06-09 DIAGNOSIS — M5416 Radiculopathy, lumbar region: Secondary | ICD-10-CM | POA: Diagnosis not present

## 2023-06-09 DIAGNOSIS — M47896 Other spondylosis, lumbar region: Secondary | ICD-10-CM | POA: Diagnosis not present

## 2023-06-16 DIAGNOSIS — M5416 Radiculopathy, lumbar region: Secondary | ICD-10-CM | POA: Diagnosis not present

## 2023-06-16 DIAGNOSIS — M47896 Other spondylosis, lumbar region: Secondary | ICD-10-CM | POA: Diagnosis not present

## 2023-06-30 DIAGNOSIS — M47896 Other spondylosis, lumbar region: Secondary | ICD-10-CM | POA: Diagnosis not present

## 2023-06-30 DIAGNOSIS — M5416 Radiculopathy, lumbar region: Secondary | ICD-10-CM | POA: Diagnosis not present

## 2023-07-02 DIAGNOSIS — C55 Malignant neoplasm of uterus, part unspecified: Secondary | ICD-10-CM | POA: Diagnosis not present

## 2023-07-02 DIAGNOSIS — I10 Essential (primary) hypertension: Secondary | ICD-10-CM | POA: Diagnosis not present

## 2023-07-02 DIAGNOSIS — G609 Hereditary and idiopathic neuropathy, unspecified: Secondary | ICD-10-CM | POA: Diagnosis not present

## 2023-07-02 DIAGNOSIS — E039 Hypothyroidism, unspecified: Secondary | ICD-10-CM | POA: Diagnosis not present

## 2023-07-02 DIAGNOSIS — E1165 Type 2 diabetes mellitus with hyperglycemia: Secondary | ICD-10-CM | POA: Diagnosis not present

## 2023-07-02 DIAGNOSIS — E7800 Pure hypercholesterolemia, unspecified: Secondary | ICD-10-CM | POA: Diagnosis not present

## 2023-07-02 DIAGNOSIS — D72819 Decreased white blood cell count, unspecified: Secondary | ICD-10-CM | POA: Diagnosis not present

## 2023-07-02 DIAGNOSIS — N1832 Chronic kidney disease, stage 3b: Secondary | ICD-10-CM | POA: Diagnosis not present

## 2023-07-06 DIAGNOSIS — M47896 Other spondylosis, lumbar region: Secondary | ICD-10-CM | POA: Diagnosis not present

## 2023-07-06 DIAGNOSIS — M5416 Radiculopathy, lumbar region: Secondary | ICD-10-CM | POA: Diagnosis not present

## 2023-07-20 DIAGNOSIS — M5416 Radiculopathy, lumbar region: Secondary | ICD-10-CM | POA: Diagnosis not present

## 2023-07-20 DIAGNOSIS — M47896 Other spondylosis, lumbar region: Secondary | ICD-10-CM | POA: Diagnosis not present

## 2023-07-21 DIAGNOSIS — M461 Sacroiliitis, not elsewhere classified: Secondary | ICD-10-CM | POA: Diagnosis not present

## 2023-07-21 DIAGNOSIS — Z6841 Body Mass Index (BMI) 40.0 and over, adult: Secondary | ICD-10-CM | POA: Diagnosis not present

## 2023-07-26 DIAGNOSIS — E669 Obesity, unspecified: Secondary | ICD-10-CM | POA: Diagnosis not present

## 2023-07-26 DIAGNOSIS — R609 Edema, unspecified: Secondary | ICD-10-CM | POA: Diagnosis not present

## 2023-07-26 DIAGNOSIS — N1831 Chronic kidney disease, stage 3a: Secondary | ICD-10-CM | POA: Diagnosis not present

## 2023-07-26 DIAGNOSIS — E785 Hyperlipidemia, unspecified: Secondary | ICD-10-CM | POA: Diagnosis not present

## 2023-07-26 DIAGNOSIS — I129 Hypertensive chronic kidney disease with stage 1 through stage 4 chronic kidney disease, or unspecified chronic kidney disease: Secondary | ICD-10-CM | POA: Diagnosis not present

## 2023-07-26 DIAGNOSIS — E1122 Type 2 diabetes mellitus with diabetic chronic kidney disease: Secondary | ICD-10-CM | POA: Diagnosis not present

## 2023-08-02 ENCOUNTER — Ambulatory Visit (HOSPITAL_COMMUNITY)
Admission: EM | Admit: 2023-08-02 | Discharge: 2023-08-02 | Disposition: A | Discharge status: Home / Self Care | Payer: Medicare PPO

## 2023-08-02 ENCOUNTER — Encounter (HOSPITAL_COMMUNITY): Payer: Self-pay | Admitting: Emergency Medicine

## 2023-08-02 DIAGNOSIS — M5431 Sciatica, right side: Secondary | ICD-10-CM

## 2023-08-02 MED ORDER — CYCLOBENZAPRINE HCL 5 MG PO TABS: 5.0000 mg | ORAL_TABLET | Freq: Three times a day (TID) | ORAL | 0 refills | Status: DC | PRN

## 2023-08-02 MED ORDER — DEXAMETHASONE SODIUM PHOSPHATE 10 MG/ML IJ SOLN
INTRAMUSCULAR | Status: AC
  Filled 2023-08-02: qty 1

## 2023-08-02 MED ORDER — KETOROLAC TROMETHAMINE 30 MG/ML IJ SOLN
INTRAMUSCULAR | Status: AC
  Filled 2023-08-02: qty 1

## 2023-08-02 MED ORDER — PREDNISONE 10 MG PO TABS: 40.0000 mg | ORAL_TABLET | Freq: Every day | ORAL | 0 refills | Status: AC

## 2023-08-02 MED ORDER — DEXAMETHASONE SODIUM PHOSPHATE 10 MG/ML IJ SOLN
10.0000 mg | Freq: Once | INTRAMUSCULAR | Status: AC
  Administered 2023-08-02: 10 mg via INTRAMUSCULAR

## 2023-08-02 MED ORDER — KETOROLAC TROMETHAMINE 30 MG/ML IJ SOLN
30.0000 mg | Freq: Once | INTRAMUSCULAR | Status: AC
  Administered 2023-08-02: 30 mg via INTRAMUSCULAR

## 2023-08-02 NOTE — Discharge Instructions (Addendum)
Right lower back pain with radiating down the right leg most consistent with sciatica. We will treat with the following:  Toradol injection for pain Decadron injection given for inflammation Prednisone 40 mg daily for 5 days. Start this tomorrow morning and take in the AM. Monitor your blood sugar closely on this medication and avoid high sugar foods.  Flexeril 5 mg 3 times daily as needed muscle spasms. Use caution as this medication can cause sleepiness.  Return to urgent care or PCP if symptoms worsen or fail to resolve.
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
## 2023-12-10 ENCOUNTER — Ambulatory Visit (HOSPITAL_COMMUNITY): Admission: EM | Admit: 2023-12-10 | Discharge: 2023-12-10 | Disposition: A | Discharge status: Home / Self Care

## 2023-12-10 ENCOUNTER — Encounter (HOSPITAL_COMMUNITY): Payer: Self-pay

## 2023-12-10 DIAGNOSIS — M5431 Sciatica, right side: Secondary | ICD-10-CM

## 2023-12-10 MED ORDER — DICLOFENAC SODIUM 50 MG PO TBEC
50.0000 mg | DELAYED_RELEASE_TABLET | Freq: Two times a day (BID) | ORAL | 1 refills | Status: DC
Start: 2023-12-10 — End: 2024-01-12

## 2023-12-10 MED ORDER — KETOROLAC TROMETHAMINE 60 MG/2ML IM SOLN
60.0000 mg | Freq: Once | INTRAMUSCULAR | Status: AC
Start: 2023-12-10 — End: 2023-12-10
  Administered 2023-12-10: 60 mg via INTRAMUSCULAR

## 2023-12-10 MED ORDER — BACLOFEN 20 MG PO TABS: 20.0000 mg | ORAL_TABLET | Freq: Three times a day (TID) | ORAL | 0 refills | Status: DC

## 2023-12-10 MED ORDER — KETOROLAC TROMETHAMINE 60 MG/2ML IM SOLN
INTRAMUSCULAR | Status: AC
  Filled 2023-12-10: qty 2

## 2023-12-10 NOTE — ED Triage Notes (Signed)
 Patient presenting with right leg pain onset this past Monday. States was seen here for the same pain before and thinks it is sciatica pain. 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  Prescriptions or OTC medications tried: Yes- Extra strength tylenol      with little relief.
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
## 2023-12-10 NOTE — Discharge Instructions (Addendum)
 1. Sciatica of right side (Primary) - ketorolac  (TORADOL ) IM injection 60 mg given in UC for acute pain to right leg secondary to sciatica. - diclofenac (VOLTAREN) 50 MG EC tablet; Take 1 tablet (50 mg total) by mouth 2 (two) times daily.  Dispense: 30 tablet; Refill: 1 - baclofen (LIORESAL) 20 MG tablet; Take 1 tablet (20 mg total) by mouth 3 (three) times daily.  Dispense: 30 each; Refill: 0 -Continue to monitor symptoms for any change in severity if there is any escalation of current symptoms or development of new symptoms follow-up in ER for further evaluation and management.

## 2024-01-09 ENCOUNTER — Emergency Department (HOSPITAL_COMMUNITY)

## 2024-01-09 ENCOUNTER — Other Ambulatory Visit: Payer: Self-pay

## 2024-01-09 ENCOUNTER — Inpatient Hospital Stay (HOSPITAL_COMMUNITY)
Admission: EM | Admit: 2024-01-09 | Discharge: 2024-01-12 | DRG: 682 | Disposition: A | Discharge status: Home / Self Care | Attending: Internal Medicine | Admitting: Internal Medicine

## 2024-01-09 ENCOUNTER — Encounter (HOSPITAL_COMMUNITY): Payer: Self-pay

## 2024-01-09 DIAGNOSIS — Z90722 Acquired absence of ovaries, bilateral: Secondary | ICD-10-CM

## 2024-01-09 DIAGNOSIS — N179 Acute kidney failure, unspecified: Principal | ICD-10-CM | POA: Diagnosis present

## 2024-01-09 DIAGNOSIS — E1140 Type 2 diabetes mellitus with diabetic neuropathy, unspecified: Secondary | ICD-10-CM | POA: Diagnosis present

## 2024-01-09 DIAGNOSIS — E1165 Type 2 diabetes mellitus with hyperglycemia: Secondary | ICD-10-CM | POA: Diagnosis not present

## 2024-01-09 DIAGNOSIS — K760 Fatty (change of) liver, not elsewhere classified: Secondary | ICD-10-CM | POA: Diagnosis present

## 2024-01-09 DIAGNOSIS — E860 Dehydration: Secondary | ICD-10-CM

## 2024-01-09 DIAGNOSIS — E1142 Type 2 diabetes mellitus with diabetic polyneuropathy: Secondary | ICD-10-CM | POA: Diagnosis not present

## 2024-01-09 DIAGNOSIS — Z8249 Family history of ischemic heart disease and other diseases of the circulatory system: Secondary | ICD-10-CM

## 2024-01-09 DIAGNOSIS — Z79890 Hormone replacement therapy (postmenopausal): Secondary | ICD-10-CM

## 2024-01-09 DIAGNOSIS — T50915A Adverse effect of multiple unspecified drugs, medicaments and biological substances, initial encounter: Secondary | ICD-10-CM | POA: Diagnosis present

## 2024-01-09 DIAGNOSIS — I10 Essential (primary) hypertension: Secondary | ICD-10-CM | POA: Diagnosis present

## 2024-01-09 DIAGNOSIS — Z90710 Acquired absence of both cervix and uterus: Secondary | ICD-10-CM

## 2024-01-09 DIAGNOSIS — E7800 Pure hypercholesterolemia, unspecified: Secondary | ICD-10-CM | POA: Diagnosis present

## 2024-01-09 DIAGNOSIS — K8020 Calculus of gallbladder without cholecystitis without obstruction: Secondary | ICD-10-CM | POA: Diagnosis present

## 2024-01-09 DIAGNOSIS — Z8542 Personal history of malignant neoplasm of other parts of uterus: Secondary | ICD-10-CM

## 2024-01-09 DIAGNOSIS — C541 Malignant neoplasm of endometrium: Secondary | ICD-10-CM | POA: Diagnosis present

## 2024-01-09 DIAGNOSIS — R4182 Altered mental status, unspecified: Secondary | ICD-10-CM

## 2024-01-09 DIAGNOSIS — M5416 Radiculopathy, lumbar region: Secondary | ICD-10-CM | POA: Diagnosis present

## 2024-01-09 DIAGNOSIS — M5441 Lumbago with sciatica, right side: Secondary | ICD-10-CM | POA: Diagnosis present

## 2024-01-09 DIAGNOSIS — E039 Hypothyroidism, unspecified: Secondary | ICD-10-CM | POA: Diagnosis present

## 2024-01-09 DIAGNOSIS — G928 Other toxic encephalopathy: Secondary | ICD-10-CM | POA: Diagnosis present

## 2024-01-09 DIAGNOSIS — Z79899 Other long term (current) drug therapy: Secondary | ICD-10-CM

## 2024-01-09 DIAGNOSIS — Z6841 Body Mass Index (BMI) 40.0 and over, adult: Secondary | ICD-10-CM

## 2024-01-09 DIAGNOSIS — G9340 Encephalopathy, unspecified: Secondary | ICD-10-CM

## 2024-01-09 DIAGNOSIS — Z833 Family history of diabetes mellitus: Secondary | ICD-10-CM

## 2024-01-09 DIAGNOSIS — Z888 Allergy status to other drugs, medicaments and biological substances status: Secondary | ICD-10-CM

## 2024-01-09 DIAGNOSIS — E1169 Type 2 diabetes mellitus with other specified complication: Secondary | ICD-10-CM

## 2024-01-09 DIAGNOSIS — R7989 Other specified abnormal findings of blood chemistry: Secondary | ICD-10-CM | POA: Diagnosis present

## 2024-01-09 DIAGNOSIS — Z794 Long term (current) use of insulin: Secondary | ICD-10-CM

## 2024-01-09 LAB — CBC WITH DIFFERENTIAL/PLATELET
Abs Immature Granulocytes: 0.01 10*3/uL (ref 0.00–0.07)
Basophils Absolute: 0 10*3/uL (ref 0.0–0.1)
Basophils Relative: 1 %
Eosinophils Absolute: 0.1 10*3/uL (ref 0.0–0.5)
Eosinophils Relative: 2 %
HCT: 35 % — ABNORMAL LOW (ref 36.0–46.0)
Hemoglobin: 11.7 g/dL — ABNORMAL LOW (ref 12.0–15.0)
Immature Granulocytes: 0 %
Lymphocytes Relative: 53 %
Lymphs Abs: 2.5 10*3/uL (ref 0.7–4.0)
MCH: 28.1 pg (ref 26.0–34.0)
MCHC: 33.4 g/dL (ref 30.0–36.0)
MCV: 83.9 fL (ref 80.0–100.0)
Monocytes Absolute: 0.5 10*3/uL (ref 0.1–1.0)
Monocytes Relative: 10 %
Neutro Abs: 1.6 10*3/uL — ABNORMAL LOW (ref 1.7–7.7)
Neutrophils Relative %: 34 %
Platelets: 201 10*3/uL (ref 150–400)
RBC: 4.17 MIL/uL (ref 3.87–5.11)
RDW: 13.7 % (ref 11.5–15.5)
WBC: 4.8 10*3/uL (ref 4.0–10.5)
nRBC: 0 % (ref 0.0–0.2)

## 2024-01-09 LAB — GLUCOSE, CAPILLARY
Glucose-Capillary: 435 mg/dL — ABNORMAL HIGH (ref 70–99)
Glucose-Capillary: 367 mg/dL — ABNORMAL HIGH (ref 70–99)
Glucose-Capillary: 371 mg/dL — ABNORMAL HIGH (ref 70–99)

## 2024-01-09 LAB — RENAL FUNCTION PANEL
Albumin: 3.4 g/dL — ABNORMAL LOW (ref 3.5–5.0)
Anion gap: 12 (ref 5–15)
BUN: 78 mg/dL — ABNORMAL HIGH (ref 8–23)
CO2: 24 mmol/L (ref 22–32)
Calcium: 8.9 mg/dL (ref 8.9–10.3)
Chloride: 97 mmol/L — ABNORMAL LOW (ref 98–111)
Creatinine, Ser: 2.58 mg/dL — ABNORMAL HIGH (ref 0.44–1.00)
GFR, Estimated: 20 mL/min — ABNORMAL LOW (ref >60)
Glucose, Bld: 472 mg/dL — ABNORMAL HIGH (ref 70–99)
Phosphorus: 4.5 mg/dL (ref 2.5–4.6)
Potassium: 4.6 mmol/L (ref 3.5–5.1)
Sodium: 133 mmol/L — ABNORMAL LOW (ref 135–145)

## 2024-01-09 LAB — MAGNESIUM: Magnesium: 1.9 mg/dL (ref 1.7–2.4)

## 2024-01-09 LAB — COMPREHENSIVE METABOLIC PANEL WITH GFR
ALT: 29 U/L (ref 0–44)
AST: 23 U/L (ref 15–41)
Albumin: 4.1 g/dL (ref 3.5–5.0)
Alkaline Phosphatase: 71 U/L (ref 38–126)
Anion gap: 12 (ref 5–15)
BUN: 73 mg/dL — ABNORMAL HIGH (ref 8–23)
CO2: 28 mmol/L (ref 22–32)
Calcium: 9.3 mg/dL (ref 8.9–10.3)
Chloride: 93 mmol/L — ABNORMAL LOW (ref 98–111)
Creatinine, Ser: 2.87 mg/dL — ABNORMAL HIGH (ref 0.44–1.00)
GFR, Estimated: 18 mL/min — ABNORMAL LOW (ref >60)
Glucose, Bld: 428 mg/dL — ABNORMAL HIGH (ref 70–99)
Potassium: 4.5 mmol/L (ref 3.5–5.1)
Sodium: 133 mmol/L — ABNORMAL LOW (ref 135–145)
Total Bilirubin: 0.7 mg/dL (ref 0.0–1.2)
Total Protein: 8.2 g/dL — ABNORMAL HIGH (ref 6.5–8.1)

## 2024-01-09 LAB — BLOOD GAS, VENOUS
Acid-Base Excess: 3.5 mmol/L — ABNORMAL HIGH (ref 0.0–2.0)
Bicarbonate: 30.9 mmol/L — ABNORMAL HIGH (ref 20.0–28.0)
O2 Saturation: 25.5 %
Patient temperature: 37
pCO2, Ven: 60 mmHg (ref 44–60)
pH, Ven: 7.32 (ref 7.25–7.43)
pO2, Ven: <31 mmHg — CL (ref 32–45)

## 2024-01-09 LAB — OSMOLALITY: Osmolality: 334 mosm/kg (ref 275–295)

## 2024-01-09 LAB — CBG MONITORING, ED
Glucose-Capillary: 378 mg/dL — ABNORMAL HIGH (ref 70–99)
Glucose-Capillary: 426 mg/dL — ABNORMAL HIGH (ref 70–99)

## 2024-01-09 LAB — HEMOGLOBIN A1C
Hgb A1c MFr Bld: 9.4 % — ABNORMAL HIGH (ref 4.8–5.6)
Mean Plasma Glucose: 223.08 mg/dL

## 2024-01-09 LAB — ACETAMINOPHEN LEVEL: Acetaminophen (Tylenol), Serum: <10 ug/mL — ABNORMAL LOW (ref 10–30)

## 2024-01-09 LAB — I-STAT CG4 LACTIC ACID, ED: Lactic Acid, Venous: 1.8 mmol/L (ref 0.5–1.9)

## 2024-01-09 LAB — AMMONIA: Ammonia: 16 umol/L (ref 9–35)

## 2024-01-09 LAB — BETA-HYDROXYBUTYRIC ACID: Beta-Hydroxybutyric Acid: 0.17 mmol/L (ref 0.05–0.27)

## 2024-01-09 LAB — SALICYLATE LEVEL: Salicylate Lvl: <7 mg/dL — ABNORMAL LOW (ref 7.0–30.0)

## 2024-01-09 LAB — ETHANOL: Alcohol, Ethyl (B): <15 mg/dL (ref <15)

## 2024-01-09 LAB — HIV ANTIBODY (ROUTINE TESTING W REFLEX): HIV Screen 4th Generation wRfx: NONREACTIVE

## 2024-01-09 LAB — TSH: TSH: 1.551 u[IU]/mL (ref 0.350–4.500)

## 2024-01-09 LAB — TROPONIN I (HIGH SENSITIVITY)
Troponin I (High Sensitivity): 6 ng/L (ref <18)
Troponin I (High Sensitivity): 6 ng/L (ref <18)

## 2024-01-09 MED ORDER — INSULIN ASPART 100 UNIT/ML IJ SOLN
0.0000 [IU] | INTRAMUSCULAR | Status: DC
  Administered 2024-01-09: 15 [IU] via SUBCUTANEOUS
  Filled 2024-01-09: qty 0.15

## 2024-01-09 MED ORDER — LACTATED RINGERS IV BOLUS
1000.0000 mL | Freq: Once | INTRAVENOUS | Status: AC
  Administered 2024-01-09: 1000 mL via INTRAVENOUS

## 2024-01-09 MED ORDER — SODIUM CHLORIDE 0.9 % IV SOLN: INTRAVENOUS | Status: AC

## 2024-01-09 MED ORDER — INSULIN GLARGINE-YFGN 100 UNIT/ML ~~LOC~~ SOLN
20.0000 [IU] | Freq: Every day | SUBCUTANEOUS | Status: DC
  Administered 2024-01-09: 20 [IU] via SUBCUTANEOUS
  Filled 2024-01-09 (×2): qty 0.2

## 2024-01-09 MED ORDER — ACETAMINOPHEN 500 MG PO TABS
1000.0000 mg | ORAL_TABLET | Freq: Four times a day (QID) | ORAL | Status: DC | PRN
  Administered 2024-01-10: 1000 mg via ORAL
  Filled 2024-01-09: qty 2

## 2024-01-09 MED ORDER — ONDANSETRON HCL 4 MG PO TABS
4.0000 mg | ORAL_TABLET | Freq: Four times a day (QID) | ORAL | Status: DC | PRN
Start: 2024-01-09 — End: 2024-01-12

## 2024-01-09 MED ORDER — SENNOSIDES-DOCUSATE SODIUM 8.6-50 MG PO TABS: 1.0000 | ORAL_TABLET | Freq: Two times a day (BID) | ORAL | Status: DC | PRN

## 2024-01-09 MED ORDER — NALOXONE HCL 0.4 MG/ML IJ SOLN
0.4000 mg | Freq: Once | INTRAMUSCULAR | Status: DC
  Filled 2024-01-09: qty 1

## 2024-01-09 MED ORDER — ACETAMINOPHEN 650 MG RE SUPP: 650.0000 mg | Freq: Four times a day (QID) | RECTAL | Status: DC | PRN

## 2024-01-09 MED ORDER — POLYETHYLENE GLYCOL 3350 17 G PO PACK
17.0000 g | PACK | Freq: Two times a day (BID) | ORAL | Status: DC | PRN
  Filled 2024-01-09: qty 1

## 2024-01-09 MED ORDER — NALOXONE HCL 0.4 MG/ML IJ SOLN
0.4000 mg | Freq: Once | INTRAMUSCULAR | Status: AC
  Administered 2024-01-09: 0.4 mg via INTRAMUSCULAR

## 2024-01-09 MED ORDER — HEPARIN SODIUM (PORCINE) 5000 UNIT/ML IJ SOLN
5000.0000 [IU] | Freq: Three times a day (TID) | INTRAMUSCULAR | Status: DC
  Administered 2024-01-09 – 2024-01-12 (×8): 5000 [IU] via SUBCUTANEOUS
  Filled 2024-01-09 (×8): qty 1

## 2024-01-09 MED ORDER — INSULIN GLARGINE-YFGN 100 UNIT/ML ~~LOC~~ SOLN: 20.0000 [IU] | Freq: Every day | SUBCUTANEOUS | Status: DC

## 2024-01-09 MED ORDER — INSULIN ASPART 100 UNIT/ML IJ SOLN
0.0000 [IU] | Freq: Every day | INTRAMUSCULAR | Status: DC
  Administered 2024-01-09: 5 [IU] via SUBCUTANEOUS
  Administered 2024-01-10: 4 [IU] via SUBCUTANEOUS

## 2024-01-09 MED ORDER — INSULIN ASPART 100 UNIT/ML IJ SOLN
0.0000 [IU] | Freq: Three times a day (TID) | INTRAMUSCULAR | Status: DC
  Administered 2024-01-10: 5 [IU] via SUBCUTANEOUS
  Administered 2024-01-10: 8 [IU] via SUBCUTANEOUS

## 2024-01-09 MED ORDER — ONDANSETRON HCL 4 MG/2ML IJ SOLN: 4.0000 mg | Freq: Four times a day (QID) | INTRAMUSCULAR | Status: DC | PRN

## 2024-01-09 MED ORDER — LABETALOL HCL 5 MG/ML IV SOLN: 10.0000 mg | INTRAVENOUS | Status: DC | PRN

## 2024-01-09 NOTE — ED Triage Notes (Signed)
 BIBA from home, complaints of dizzyness and confusion.  CBG [id number]

## 2024-01-09 NOTE — Plan of Care (Signed)

## 2024-01-09 NOTE — Progress Notes (Signed)
 Pt and family requesting PO intake, Pt currently NPO. Dr. Mae Schlossman notified. Advised to do bedside nursing swallow eval and if patient passes, ok to have carb mod diet. Pt passed bedside swallow and documented on flowsheet.

## 2024-01-09 NOTE — H&P (Signed)
 History and Physical    Patient: Christy Carpenter YQM:[id number] DOB: 1956-02-24 DOA: 01/09/2024 DOS: the patient was seen and examined on 01/09/2024 PCP: Dorena Gander, MD  Patient coming from: Home  Chief Complaint:  Chief Complaint  Patient presents with   Altered Mental Status   HPI: Christy Carpenter is a 68 y.o. female with PMH of grade I endometrial cancer s/p TAH/BSO/LN in 06/2020, IDDM-2, HTN, hypothyroidism, morbid obesity, osteoarthritis and chronic back pain with radiculopathy brought to ED by EMS due to dizziness and confusion.  Patient is very somnolent and barely wakes to voice but follows some commands.  History provided by patient's husband at bedside.  He stated that she complained of dizziness that he describes as spinning sensation yesterday afternoon.  She was also somewhat incoherent about 10 PM last night.  Remains confused this morning and patient's husband decided to call EMS.  Per triage note, CBG was 378.  Patient was seen in urgent care on 4/25 for right-sided sciatica for which she received 60 mg of Toradol  injection and discharged on Voltaren  tablet 50 mg twice a day and baclofen  20 mg 3 times a day.  Per patient's husband, she last took her medications yesterday.  She did not take any of her medications today.  No suspicion for overdose.  Per husband, no fever, runny nose, sore throat, chest pain, cough, shortness of breath, nausea, vomiting, abdominal pain, change in bladder habit or p.o. intake.  Patient's husband denies history of sleep apnea.  No fall, seizure-like activity or focal neurosymptoms.  She does not smoke cigarettes, drink alcohol or use recreational drug use.  Per husband, interested in cardiopulmonary resuscitation in event of sudden cardiopulmonary arrest.  In ED, BP elevated to 159/102.  97% on RA.  Afebrile.  VBG without significant finding.  Na 133.  BMP glucose elevated to 428 but no acidosis. Cr 2.87 (0.8 in 2023).  BUN 73.  Hgb 11.7.  Otherwise, CMP and  CBC without significant finding.  Initial troponin and lactic acid negative.  TSH 1.5.  Tylenol  and salicylate level within normal.  Noncontrast CT head without acute finding.  CXR with decreased lung volume and poor penetration versus vascular congestion.  CT abdomen and pelvis showed hepatic steatosis and cholelithiasis but no urinary obstruction.  Received 2 L of LR bolus, and admission requested for AKI and encephalopathy.  Review of Systems: As mentioned in the history of present illness. All other systems reviewed and are negative. Past Medical History:  Diagnosis Date   Bacteremia 06/28/2020   Cancer (HCC)    endometreal ut   Chest pain 08/19/2017   Diabetes mellitus    Edema of both lower extremities    GERD (gastroesophageal reflux disease)    Hypercholesterolemia    Hypertension    Ileus (HCC)    Neuropathy of both feet    Pelvic fluid collection    Plantar fasciitis    Left foot   SIRS due to infectious process with acute organ dysfunction (HCC) 06/24/2020   Past Surgical History:  Procedure Laterality Date   HAND LIGAMENT RECONSTRUCTION     ROBOTIC ASSISTED TOTAL HYSTERECTOMY WITH BILATERAL SALPINGO OOPHERECTOMY N/A 06/20/2020   Procedure: XI ROBOTIC ASSISTED TOTAL HYSTERECTOMY WITH BILATERAL SALPINGO OOPHORECTOMY, RIGHT URETERAL LYSIS;  Surgeon: Alphonso Aschoff, MD;  Location: [facility];  Service: Gynecology;  Laterality: N/A;   SENTINEL NODE BIOPSY N/A 06/20/2020   Procedure: SENTINEL NODE BIOPSY;  Surgeon: Alphonso Aschoff, MD;  Location: [facility];  Service: Gynecology;  Laterality: N/A;   Social History:  reports that she has never smoked. She has never used smokeless tobacco. She reports that she does not drink alcohol and does not use drugs.  Allergies  Allergen Reactions   Byetta 10 Mcg Pen [Exenatide] Itching   Dapagliflozin  Other (See Comments)    Yeast infections   Januvia  [Sitagliptin ]     Yeast infections    Invokana [Canagliflozin] Other (See Comments)    Yeast infections     Family History  Problem Relation Age of Onset   Diabetes Mellitus II Mother    Stroke Mother    Hypertension Mother    Kidney disease Mother    Stroke Brother    Diabetes Mellitus II Brother    Hypertension Brother    Cancer Paternal Aunt        ovarian    Prior to Admission medications   Medication Sig Start Date End Date Taking? Authorizing Provider  amitriptyline  (ELAVIL ) 50 MG tablet Take 50 mg by mouth at bedtime.    [provider]  amLODipine  (NORVASC ) 5 MG tablet Take 1 tablet (5 mg total) by mouth daily. 02/19/22   Samtani, Jai-Gurmukh, MD  atorvastatin  (LIPITOR) 40 MG tablet Take 40 mg by mouth daily.    [provider]  baclofen  (LIORESAL ) 20 MG tablet Take 1 tablet (20 mg total) by mouth 3 (three) times daily. 12/10/23   Reddick, Johnathan B, NP  Continuous Blood Gluc Sensor (FREESTYLE LIBRE 2 SENSOR) MISC  06/13/20   [provider]  diclofenac  (VOLTAREN ) 50 MG EC tablet Take 1 tablet (50 mg total) by mouth 2 (two) times daily. 12/10/23   Reddick, Johnathan B, NP  fenofibrate  micronized (LOFIBRA) 134 MG capsule Take 134 mg by mouth daily before breakfast.    [provider]  levothyroxine  (SYNTHROID ) 50 MCG tablet Take 50 mcg by mouth daily before breakfast.    [provider]  NOVOLIN N FLEXPEN 100 UNIT/ML Kiwkpen Inject 75 Units into the skin 3 (three) times daily.  04/17/20   [provider]  pregabalin  (LYRICA ) 150 MG capsule Take 150 mg by mouth 3 (three) times daily. 01/08/22   [provider]  TRESIBA FLEXTOUCH 200 UNIT/ML FlexTouch Pen Inject into the skin. 12/04/23   [provider]    Physical Exam: Vitals:   01/09/24 1036 01/09/24 1043 01/09/24 1049 01/09/24 1453  BP: (!) 159/102 (!) 152/102    Pulse: 87 92    Resp: 18     Temp: 98.7 F (37.1 C) 97.7 F (36.5 C)  97.8 F (36.6 C)  TempSrc: Oral Oral    SpO2: 98% 97%    Weight:   97.5 kg   Height:   5\' 1"  (1.549 m)    GENERAL: No  apparent distress.  Nontoxic. HEENT: MMM.  Vision and hearing grossly intact.  NECK: Supple.  No apparent JVD.  RESP:  No IWOB.  Fair aeration bilaterally. CVS:  RRR. Heart sounds normal.  ABD/GI/GU: BS+. Abd soft, NTND.  MSK/EXT:   No apparent deformity. Moves extremities.  Nonpitting trace BLE edema. SKIN: no apparent skin lesion or wound NEURO: Sleepy and somnolent.  Briefly wakes to voice.  Follows some commands.  Pinpoint pupil bilaterally.  Moves all extremities.  No facial asymmetry.  No apparent focal neuro deficit but limited exam by mental status. PSYCH: Sleepy and somnolent.  No distress or agitation. Data Reviewed: See HPI  Assessment and Plan: AKI/azotemia: Likely prerenal and NSAID related.  Unknown baseline but  a creatinine of 0.8 in 2023.  No signs of obstruction on CT abdomen and pelvis.  Received 2 L of LR bolus in ED Recent Labs    01/09/24 1051  BUN 73*  CREATININE 2.87*  - IV NS at 150 cc an hour - Strict intake and output - Recheck renal panel this afternoon and tomorrow morning   Drug-induced acute encephalopathy: Recently started on high-dose baclofen .  She was also on amitriptyline  and significant dose of Lyrica .  She has azotemia raising concern for uremia as well.  She has no focal neurologic deficit to suggest CVA.  CT head without acute finding.  Ammonia, TSH and VBG unrevealing.  She has pinpoint pupil on exam.  Low suspicion for seizure. - Hold baclofen , Lyrica , and amitriptyline  - Trial of IV Narcan 0.4 mg x 1 - Reorientation and delirium precaution - N.p.o. pending bedside swallow eval.  -Aspiration precaution - Fall precaution - SLP eval  Uncontrolled IDDM-2 with hyperglycemia and neuropathy: CBG elevated to 435.  Not in DKA or HHS.  Has not received insulin  yet.  Husband unclear how much insulin  she uses at home. Recent Labs  Lab 01/09/24 1038 01/09/24 1438 01/09/24 1607  GLUCAP 378* 426* 435*  - Semglee  20 units daily starting  now -SSI-moderate every 4 hours while NPO -Continue CBG monitoring and adjust as appropriate  Essential hypertension: BP elevated - IV labetalol as needed until she can take p.o. safely  Lower back pain with radiculopathy - Tylenol  as needed - Hold home meds - PT/OT goal  History of endometrial cancer: Grade 1. S/p TAH/BSO/LN in 2021  Polypharmacy - Hold home baclofen , Lyrica  and Elavil   Morbid obesity Body mass index is 40.62 kg/m.     Advance Care Planning:   Code Status: Full Code -confirmed with patient's husband at bedside  Consults: None  Family Communication: Updated patient's husband at bedside  Severity of Illness: The appropriate patient status for this patient is OBSERVATION. Observation status is judged to be reasonable and necessary in order to provide the required intensity of service to ensure the patient's safety. The patient's presenting symptoms, physical exam findings, and initial radiographic and laboratory data in the context of their medical condition is felt to place them at decreased risk for further clinical deterioration. Furthermore, it is anticipated that the patient will be medically stable for discharge from the hospital within 2 midnights of admission.   Author: Theadore Finger, MD 01/09/2024 3:55 PM  For on call review [email or web address].

## 2024-01-09 NOTE — ED Provider Notes (Signed)
 [place] EMERGENCY DEPARTMENT AT [facility] Provider Note   CSN: [id number] Arrival date & time: 01/09/24  1026     History Chief Complaint  Patient presents with   Altered Mental Status    Christy Carpenter is a 68 y.o. female with history of hypertension, diabetes presents emerged part today for evaluation of altered mental status and elevated blood sugar.  Patient is confused but alert.  Poor historian, husband at bedside reports majority of the history.  Has reports that last known well was around 1800 yesterday.  They were at [employer] when she started complaining of room spinning sensation.  He reports that she was acting a little "off".  He reports that they went home afterwards and was sitting in their den/living space where she was still complaining of some room spinning sensation.  He reports that he fell asleep and woke up and she was still asleep in the chair at midnight and told her that he was going to go to sleep.  When he woke up the next morning/today around 0900, the patient was sitting in the chair still which was very uncommon for her.  He reports that whenever she woke up she was very confused and did not know who she was and reports that the month was February.  He reports that she is on several medications but does not have the list.  He reports that she was on some new pain medication for her knee and back however does not know what this was called.  She does wear a continuous glucose monitor reports that her sugars are reading high greater than 600 so he called EMS.  He reports that she is acting confused like this before a few years prior when she had a UTI.  No previous history of strokes.   Altered Mental Status      Home Medications Prior to Admission medications   Medication Sig Start Date End Date Taking? Authorizing Provider  amitriptyline  (ELAVIL ) 50 MG tablet Take 50 mg by mouth at bedtime.    [provider]  amLODipine  (NORVASC ) 5 MG  tablet Take 1 tablet (5 mg total) by mouth daily. 02/19/22   Samtani, Jai-Gurmukh, MD  atorvastatin  (LIPITOR) 40 MG tablet Take 40 mg by mouth daily.    [provider]  baclofen  (LIORESAL ) 20 MG tablet Take 1 tablet (20 mg total) by mouth 3 (three) times daily. 12/10/23   Reddick, Johnathan B, NP  Continuous Blood Gluc Sensor (FREESTYLE LIBRE 2 SENSOR) MISC  06/13/20   [provider]  diclofenac  (VOLTAREN ) 50 MG EC tablet Take 1 tablet (50 mg total) by mouth 2 (two) times daily. 12/10/23   Reddick, Johnathan B, NP  fenofibrate  micronized (LOFIBRA) 134 MG capsule Take 134 mg by mouth daily before breakfast.    [provider]  levothyroxine  (SYNTHROID ) 50 MCG tablet Take 50 mcg by mouth daily before breakfast.    [provider]  NOVOLIN N FLEXPEN 100 UNIT/ML Kiwkpen Inject 75 Units into the skin 3 (three) times daily.  04/17/20   [provider]  pregabalin  (LYRICA ) 150 MG capsule Take 150 mg by mouth 3 (three) times daily. 01/08/22   [provider]  TRESIBA FLEXTOUCH 200 UNIT/ML FlexTouch Pen Inject into the skin. 12/04/23   [provider]      Allergies    Byetta 10 mcg pen [exenatide], Dapagliflozin , Januvia  [sitagliptin ], and Invokana [canagliflozin]    Review of Systems   Review of Systems  Unable to perform ROS: Mental status change    Physical Exam Updated Vital Signs BP (!) 152/102 (BP Location: Right Arm)   Pulse 92   Temp 97.7 F (36.5 C) (Oral)   Resp 18   Ht 5\' 1"  (1.549 m)   Wt 97.5 kg   SpO2 97%   BMI 40.62 kg/m  Physical Exam Vitals and nursing note reviewed.  Constitutional:      Appearance: She is not toxic-appearing.     Comments: Pleasantly confused  HENT:     Mouth/Throat:     Mouth: Mucous membranes are dry.  Eyes:     General: No scleral icterus.    Extraocular Movements: Extraocular movements intact.     Pupils: Pupils are equal, round, and reactive to light.  Cardiovascular:     Rate and  Rhythm: Normal rate.     Pulses:          Radial pulses are 2+ on the right side and 2+ on the left side.       Dorsalis pedis pulses are 2+ on the right side and 2+ on the left side.       Posterior tibial pulses are 2+ on the right side and 2+ on the left side.  Pulmonary:     Effort: Pulmonary effort is normal. No respiratory distress.  Abdominal:     Palpations: Abdomen is soft.     Tenderness: There is no abdominal tenderness. There is no guarding or rebound.  Musculoskeletal:     Cervical back: Normal range of motion.     Right lower leg: No edema.     Left lower leg: No edema.  Skin:    General: Skin is warm and dry.  Neurological:     General: No focal deficit present.     Mental Status: She is alert.     Cranial Nerves: No cranial nerve deficit or facial asymmetry.     Motor: No weakness or pronator drift.     Comments: Patient is oriented to person and place and time however reports that she has to think about them for minutes in between being able to give me a straight answer.     ED Results / Procedures / Treatments   Labs (all labs ordered are listed, but only abnormal results are displayed) Labs Reviewed  CBC WITH DIFFERENTIAL/PLATELET - Abnormal; Notable for the following components:      Result Value   Hemoglobin 11.7 (*)    HCT 35.0 (*)    Neutro Abs 1.6 (*)    All other components within normal limits  COMPREHENSIVE METABOLIC PANEL WITH GFR - Abnormal; Notable for the following components:   Sodium 133 (*)    Chloride 93 (*)    Glucose, Bld 428 (*)    BUN 73 (*)    Creatinine, Ser 2.87 (*)    Total Protein 8.2 (*)    GFR, Estimated 18 (*)    All other components within normal limits  BLOOD GAS, VENOUS - Abnormal; Notable for the following components:   pO2, Ven <31 (*)    Bicarbonate 30.9 (*)    Acid-Base Excess 3.5 (*)    All other components within normal limits  CBG MONITORING, ED - Abnormal; Notable for the following components:    Glucose-Capillary 378 (*)    All other components within normal limits  URINALYSIS, ROUTINE W REFLEX MICROSCOPIC  TSH  AMMONIA  RAPID URINE DRUG SCREEN, HOSP PERFORMED  ETHANOL  SALICYLATE LEVEL  ACETAMINOPHEN  LEVEL  BETA-HYDROXYBUTYRIC ACID  OSMOLALITY  I-STAT CG4 LACTIC ACID, ED  TROPONIN I (HIGH SENSITIVITY)    EKG EKG Interpretation Date/Time:  Sunday Jan 09 2024 11:07:23 EDT Ventricular Rate:  86 PR Interval:  197 QRS Duration:  96 QT Interval:  397 QTC Calculation: 475 R Axis:   25  Text Interpretation: Sinus rhythm Confirmed by Iva Mariner [id number]) on 01/09/2024 11:12:39 AM  Radiology DG Chest 2 View Result Date: 01/09/2024 CLINICAL DATA:  Cough. EXAM: CHEST - 2 VIEW COMPARISON:  07/28/2021 FINDINGS: Low lung volumes. Stable cardiomediastinal contours. Pulmonary vascular congestion. No pleural effusion. No airspace consolidation. No acute osseous findings. IMPRESSION: Low lung volumes and pulmonary vascular congestion. Electronically Signed   By: Kimberley Penman M.D.   On: 01/09/2024 12:31   CT ABDOMEN PELVIS WO CONTRAST Result Date: 01/09/2024 CLINICAL DATA:  Left lower quadrant abdominal pain. EXAM: CT ABDOMEN AND PELVIS WITHOUT CONTRAST TECHNIQUE: Multidetector CT imaging of the abdomen and pelvis was performed following the standard protocol without IV contrast. RADIATION DOSE REDUCTION: This exam was performed according to the departmental dose-optimization program which includes automated exposure control, adjustment of the mA and/or kV according to patient size and/or use of iterative reconstruction technique. COMPARISON:  09/27/2020 FINDINGS: Lower chest: 3 mm right middle lobe lung nodule, image 1/6. Unchanged from 09/27/2020 compatible with a benign nodule. No acute abnormality. Hepatobiliary: Hepatic steatosis. No focal liver abnormality. Stones identified layering within the dependent portion of the gallbladder. No gallbladder wall thickening or surrounding inflammation.  No bile duct dilatation. Pancreas: Unremarkable. No pancreatic ductal dilatation or surrounding inflammatory changes. Spleen: Normal in size without focal abnormality. Adrenals/Urinary Tract: Normal adrenal glands. No nephrolithiasis, hydronephrosis or suspicious mass. None urinary bladder appears within normal limits. Stomach/Bowel: Stomach is normal. The appendix is not confidently identified. No secondary signs of acute appendicitis. No pathologic dilatation of the large or small bowel loops. No bowel wall thickening or inflammatory change. Vascular/Lymphatic: Aortic atherosclerosis without aneurysm. No abdominopelvic adenopathy. Reproductive: Status post hysterectomy. No adnexal masses. Other: No ascites. No focal fluid collections. No signs of pneumoperitoneum. Fat containing umbilical hernia, small. Musculoskeletal: No acute or significant osseous findings. IMPRESSION: 1. No acute findings within the abdomen or pelvis. 2. Hepatic steatosis. 3. Cholelithiasis. 4. Fat containing umbilical hernia, small. 5.  Aortic Atherosclerosis (ICD10-I70.0). Electronically Signed   By: Kimberley Penman M.D.   On: 01/09/2024 12:29   CT Head Wo Contrast Result Date: 01/09/2024 CLINICAL DATA:  Mental status change.  Dizziness and confusion. EXAM: CT HEAD WITHOUT CONTRAST TECHNIQUE: Contiguous axial images were obtained from the base of the skull through the vertex without intravenous contrast. RADIATION DOSE REDUCTION: This exam was performed according to the departmental dose-optimization program which includes automated exposure control, adjustment of the mA and/or kV according to patient size and/or use of iterative reconstruction technique. COMPARISON:  02/16/2022 FINDINGS: Brain: Ventricles, cisterns and other CSF spaces are normal. There is no mass, mass effect, shift of midline structures or acute hemorrhage. No evidence of acute infarction. Subtle chronic small vessel ischemic disease. Vascular: No hyperdense vessel or  unexpected calcification. Skull: Normal. Negative for fracture or focal lesion. Sinuses/Orbits: No acute finding. Other: None. IMPRESSION: 1. No acute findings. 2. Subtle chronic small vessel ischemic disease. Electronically Signed   By: Roda Cirri M.D.   On: 01/09/2024 12:21    Procedures Procedures   Medications Ordered in ED Medications  lactated ringers  bolus 1,000 mL (has no administration in time range)    ED  Course/ Medical Decision Making/ A&P   Medical Decision Making Amount and/or Complexity of Data Reviewed Labs: ordered. Radiology: ordered.  Risk Decision regarding hospitalization.   68 y.o. female presents to the ER for evaluation of altered mental status/hyperglycemia. Differential diagnosis includes but is not limited to drug-related, hypoxia, hyper/hypoglycemia, encephalopathy, sepsis, DKA/HHS, brain lesion, CVA, seizure, environmental, psychiatric. Vital signs elevated blood pressure otherwise unremarkable. Physical exam as noted above.   Patient is not have any focal neurodeficit.  Do not think this is any CVA at this time, patient was assessed at bedside by my attending who also agrees.  He reports that she did have some tenderness to her abdomen on his evaluation and has ordered on a CT abdomen and doing such.  Will continue to give her fluids and wait for labs to return.  I independently reviewed and interpreted the patient's labs.  VBG pH is 7.32.  CBC shows mild anemia with hemoglobin 1.7, hematocrit 35.0.  Mildly decreased neutrophil count but no leukocytosis or leukopenia.  CMP shows sodium 133 however likely pseudohyponatremia given glucose at 428.  Chloride 93.  Patient does have significant AKI with creatinine of 2.83 and also has elevated BUN at 73.  Baseline kidney function around 1.0.  Total protein 8.2.  Otherwise no electrolyte or LFT abnormality.  Ammonia within normal limits.  Lactic acid within normal limits.  Ethanol, acetaminophen , salicylate levels  undetectable.  Urine still need to be collected.  CXR Low lung volumes and pulmonary vascular congestion. Per radiologist's interpretation.    CT Head 1. No acute findings. 2. Subtle chronic small vessel ischemic disease. Per radiologist's interpretation.    CT abdomen pelvis 1. No acute findings within the abdomen or pelvis. 2. Hepatic steatosis. 3. Cholelithiasis. 4. Fat containing umbilical hernia, small. 5.  Aortic Atherosclerosis. Per radiologist's interpretation.    EKG reviewed and interpreted by my attending and read as sinus rhythm.   Patient was ordered 2 L of LR given her significant AKI although this will help bring down her sugar as well.  I have added on a osmolality drawn with any HHS.  Does not meet DKA criteria however no ketones are back yet as she does not have a widened anion gap or acidosis.  Walked into the patient's room and she had only received around 200 cc of this fluid movement a few hours.  Nursing was alerted and have encouraged patient to keep the arm straight as the IV had some position to it.  Will need to have urine checked while upstairs check for infection causing possible source of confusion.  She may have some fusion given her elevated BUN being 73 as well as the AKI.  Could also be from some form of HHS.  I discussed findings with daughter and husband at bedside.  Patient is sleeping comfortably with equal chest rise.  Discussed need for admission.  Family is agreeable.  Will admit to [facility] hospitalist.  Portions of this report may have been transcribed using voice recognition software. Every effort was made to ensure accuracy; however, inadvertent computerized transcription errors may be present.   Final Clinical Impression(s) / ED Diagnoses Final diagnoses:  None    Rx / DC Orders ED Discharge Orders     None         Spence Dux, Kirby Peoples 01/09/24 2159    Iva Mariner, MD 01/10/24 2312

## 2024-01-10 DIAGNOSIS — R7989 Other specified abnormal findings of blood chemistry: Secondary | ICD-10-CM | POA: Diagnosis present

## 2024-01-10 DIAGNOSIS — Z833 Family history of diabetes mellitus: Secondary | ICD-10-CM | POA: Diagnosis not present

## 2024-01-10 DIAGNOSIS — E1140 Type 2 diabetes mellitus with diabetic neuropathy, unspecified: Secondary | ICD-10-CM | POA: Diagnosis present

## 2024-01-10 DIAGNOSIS — Z79890 Hormone replacement therapy (postmenopausal): Secondary | ICD-10-CM | POA: Diagnosis not present

## 2024-01-10 DIAGNOSIS — K8020 Calculus of gallbladder without cholecystitis without obstruction: Secondary | ICD-10-CM | POA: Diagnosis present

## 2024-01-10 DIAGNOSIS — G928 Other toxic encephalopathy: Secondary | ICD-10-CM | POA: Diagnosis present

## 2024-01-10 DIAGNOSIS — N179 Acute kidney failure, unspecified: Secondary | ICD-10-CM | POA: Diagnosis present

## 2024-01-10 DIAGNOSIS — K760 Fatty (change of) liver, not elsewhere classified: Secondary | ICD-10-CM | POA: Diagnosis present

## 2024-01-10 DIAGNOSIS — Z8249 Family history of ischemic heart disease and other diseases of the circulatory system: Secondary | ICD-10-CM | POA: Diagnosis not present

## 2024-01-10 DIAGNOSIS — M5441 Lumbago with sciatica, right side: Secondary | ICD-10-CM | POA: Diagnosis present

## 2024-01-10 DIAGNOSIS — G9340 Encephalopathy, unspecified: Secondary | ICD-10-CM | POA: Diagnosis not present

## 2024-01-10 DIAGNOSIS — E7800 Pure hypercholesterolemia, unspecified: Secondary | ICD-10-CM | POA: Diagnosis present

## 2024-01-10 DIAGNOSIS — Z90722 Acquired absence of ovaries, bilateral: Secondary | ICD-10-CM | POA: Diagnosis not present

## 2024-01-10 DIAGNOSIS — C541 Malignant neoplasm of endometrium: Secondary | ICD-10-CM | POA: Diagnosis not present

## 2024-01-10 DIAGNOSIS — M5416 Radiculopathy, lumbar region: Secondary | ICD-10-CM | POA: Diagnosis present

## 2024-01-10 DIAGNOSIS — E039 Hypothyroidism, unspecified: Secondary | ICD-10-CM | POA: Diagnosis present

## 2024-01-10 DIAGNOSIS — Z6841 Body Mass Index (BMI) 40.0 and over, adult: Secondary | ICD-10-CM | POA: Diagnosis not present

## 2024-01-10 DIAGNOSIS — Z90710 Acquired absence of both cervix and uterus: Secondary | ICD-10-CM | POA: Diagnosis not present

## 2024-01-10 DIAGNOSIS — Z8542 Personal history of malignant neoplasm of other parts of uterus: Secondary | ICD-10-CM | POA: Diagnosis not present

## 2024-01-10 DIAGNOSIS — Z794 Long term (current) use of insulin: Secondary | ICD-10-CM | POA: Diagnosis not present

## 2024-01-10 DIAGNOSIS — T50915A Adverse effect of multiple unspecified drugs, medicaments and biological substances, initial encounter: Secondary | ICD-10-CM | POA: Diagnosis present

## 2024-01-10 DIAGNOSIS — E1165 Type 2 diabetes mellitus with hyperglycemia: Secondary | ICD-10-CM | POA: Diagnosis present

## 2024-01-10 DIAGNOSIS — Z888 Allergy status to other drugs, medicaments and biological substances status: Secondary | ICD-10-CM | POA: Diagnosis not present

## 2024-01-10 DIAGNOSIS — Z79899 Other long term (current) drug therapy: Secondary | ICD-10-CM | POA: Diagnosis not present

## 2024-01-10 DIAGNOSIS — I10 Essential (primary) hypertension: Secondary | ICD-10-CM | POA: Diagnosis present

## 2024-01-10 LAB — CBC
HCT: 31 % — ABNORMAL LOW (ref 36.0–46.0)
Hemoglobin: 10.1 g/dL — ABNORMAL LOW (ref 12.0–15.0)
MCH: 27.8 pg (ref 26.0–34.0)
MCHC: 32.6 g/dL (ref 30.0–36.0)
MCV: 85.4 fL (ref 80.0–100.0)
Platelets: 195 10*3/uL (ref 150–400)
RBC: 3.63 MIL/uL — ABNORMAL LOW (ref 3.87–5.11)
RDW: 13.8 % (ref 11.5–15.5)
WBC: 5.6 10*3/uL (ref 4.0–10.5)
nRBC: 0 % (ref 0.0–0.2)

## 2024-01-10 LAB — GLUCOSE, CAPILLARY
Glucose-Capillary: 333 mg/dL — ABNORMAL HIGH (ref 70–99)
Glucose-Capillary: 277 mg/dL — ABNORMAL HIGH (ref 70–99)
Glucose-Capillary: 228 mg/dL — ABNORMAL HIGH (ref 70–99)
Glucose-Capillary: 277 mg/dL — ABNORMAL HIGH (ref 70–99)
Glucose-Capillary: 337 mg/dL — ABNORMAL HIGH (ref 70–99)

## 2024-01-10 LAB — RENAL FUNCTION PANEL
Albumin: 3.3 g/dL — ABNORMAL LOW (ref 3.5–5.0)
Anion gap: 11 (ref 5–15)
BUN: 64 mg/dL — ABNORMAL HIGH (ref 8–23)
CO2: 26 mmol/L (ref 22–32)
Calcium: 9 mg/dL (ref 8.9–10.3)
Chloride: 100 mmol/L (ref 98–111)
Creatinine, Ser: 2.13 mg/dL — ABNORMAL HIGH (ref 0.44–1.00)
GFR, Estimated: 25 mL/min — ABNORMAL LOW (ref >60)
Glucose, Bld: 330 mg/dL — ABNORMAL HIGH (ref 70–99)
Phosphorus: 3.7 mg/dL (ref 2.5–4.6)
Potassium: 4.4 mmol/L (ref 3.5–5.1)
Sodium: 137 mmol/L (ref 135–145)

## 2024-01-10 LAB — MAGNESIUM: Magnesium: 2 mg/dL (ref 1.7–2.4)

## 2024-01-10 MED ORDER — INSULIN ASPART 100 UNIT/ML IJ SOLN
4.0000 [IU] | Freq: Three times a day (TID) | INTRAMUSCULAR | Status: DC
  Administered 2024-01-10 – 2024-01-12 (×5): 4 [IU] via SUBCUTANEOUS

## 2024-01-10 MED ORDER — ACETAMINOPHEN 500 MG PO TABS
1000.0000 mg | ORAL_TABLET | Freq: Four times a day (QID) | ORAL | Status: DC
  Administered 2024-01-10 – 2024-01-12 (×8): 1000 mg via ORAL
  Filled 2024-01-10 (×9): qty 2

## 2024-01-10 MED ORDER — INSULIN GLARGINE-YFGN 100 UNIT/ML ~~LOC~~ SOLN
30.0000 [IU] | Freq: Every day | SUBCUTANEOUS | Status: DC
  Administered 2024-01-10: 30 [IU] via SUBCUTANEOUS
  Filled 2024-01-10 (×2): qty 0.3

## 2024-01-10 MED ORDER — PREGABALIN 50 MG PO CAPS
50.0000 mg | ORAL_CAPSULE | Freq: Two times a day (BID) | ORAL | Status: DC
  Administered 2024-01-10 – 2024-01-12 (×5): 50 mg via ORAL
  Filled 2024-01-10 (×5): qty 1

## 2024-01-10 MED ORDER — HYDRALAZINE HCL 25 MG PO TABS
25.0000 mg | ORAL_TABLET | Freq: Four times a day (QID) | ORAL | Status: DC | PRN
  Administered 2024-01-11 – 2024-01-12 (×2): 25 mg via ORAL
  Filled 2024-01-10 (×2): qty 1

## 2024-01-10 MED ORDER — INSULIN ASPART 100 UNIT/ML IJ SOLN
0.0000 [IU] | Freq: Three times a day (TID) | INTRAMUSCULAR | Status: DC
  Administered 2024-01-10: 11 [IU] via SUBCUTANEOUS
  Administered 2024-01-11: 4 [IU] via SUBCUTANEOUS
  Administered 2024-01-11: 7 [IU] via SUBCUTANEOUS
  Administered 2024-01-12: 3 [IU] via SUBCUTANEOUS

## 2024-01-10 NOTE — Plan of Care (Signed)
  Problem: Metabolic: Goal: Ability to maintain appropriate glucose levels will improve Outcome: Progressing   Problem: Clinical Measurements: Goal: Ability to maintain clinical measurements within normal limits will improve Outcome: Progressing Goal: Will remain free from infection Outcome: Progressing Goal: Diagnostic test results will improve Outcome: Progressing

## 2024-01-10 NOTE — Progress Notes (Signed)
 PROGRESS NOTE  Christy Carpenter ZOX:[id number] DOB: 01/03/1956   PCP: Dorena Gander, MD  Patient is from: Home.  Lives with husband.  Independently ambulates at baseline but limited by pain  DOA: 01/09/2024 LOS: 0  Chief complaints Chief Complaint  Patient presents with   Altered Mental Status     Brief Narrative / Interim history: 68 y.o. female with PMH of grade I endometrial cancer s/p TAH/BSO/LN in 06/2020, IDDM-2, HTN, hypothyroidism, morbid obesity, osteoarthritis and chronic back pain with radiculopathy brought to ED by EMS due to dizziness and confusion, and admitted with AKI and drug-induced acute encephalopathy from polypharmacy.  Patient was recently started on baclofen  and diclofenac  when she was seen in urgent care.  She is already on Lyrica  and amitriptyline . Cr 2.87 (0.8 in 2023).  173.  Noncontrast CT head without acute finding.   CT abdomen and pelvis showed hepatic steatosis and cholelithiasis but no urinary obstruction.  Started on IV fluid and admitted.  The next day, encephalopathy resolved.  AKI improved.  Remains on IV fluid.   Subjective: Seen and examined earlier this morning.  No major events overnight or this morning.  No complaint other than chronic neuropathy and sciatica.  No recollection into why she was in the hospital.  Patient's husband at bedside.  Denies chest pain, shortness of breath, GI or UTI symptoms.  Objective: Vitals:   01/09/24 1951 01/10/24 0018 01/10/24 0405 01/10/24 1157  BP: 134/63 (!) 144/68 127/66 139/67  Pulse: 88 94 81 81  Resp: 18 18 20 16   Temp: 98.4 F (36.9 C) 98.1 F (36.7 C) 98 F (36.7 C) 98.1 F (36.7 C)  TempSrc: Oral Oral Oral   SpO2: 96% 98% 100% 98%  Weight:      Height:        Examination:  GENERAL: No apparent distress.  Nontoxic. HEENT: MMM.  Vision and hearing grossly intact.  NECK: Supple.  No apparent JVD.  RESP:  No IWOB.  Fair aeration bilaterally. CVS:  RRR. Heart sounds normal.  ABD/GI/GU: BS+. Abd  soft, NTND.  MSK/EXT:  Moves extremities. No apparent deformity.  Trace nonpitting BLE edema. SKIN: no apparent skin lesion or wound NEURO: Awake but not quite alert.  Oriented x 4.  No apparent focal neuro deficit. PSYCH: Calm. Normal affect.   Consultants:  None  Procedures: None  Microbiology summarized: None  Assessment and plan: AKI/azotemia: Cr 0.8 in 2023.  No interval value.  AKI likely prerenal and NSAID related.  Recently started on diclofenac .  No signs of obstruction on CT abdomen and pelvis.  Received 2 L of LR bolus in ED.  Recent Labs    01/09/24 1051 01/09/24 1644 01/10/24 0531  BUN 73* 78* 64*  CREATININE 2.87* 2.58* 2.13*  -Continue IV fluid. -Strict intake and output -Recheck renal panel in the morning     Drug-induced acute encephalopathy: Recently started on high-dose baclofen .  Was already on amitriptyline  and Lyrica .  She had elevated BUN as well.  She has no focal neurologic deficit to suggest CVA.  CT head without acute finding.  Ammonia, TSH and VBG unrevealing.  She has pinpoint pupil on exam.  Low suspicion for seizure.  Encephalopathy resolving. -Resume home Lyrica  at a lower dose -Continue holding baclofen  and amitriptyline .  -Reorientation and delirium precaution -Fall precaution.   Uncontrolled IDDM-2 with hyperglycemia and neuropathy: A1c 9.4%.  Not in DKA or HHS.  Reports using Tresiba 75 units twice daily and NovoLog  75 units 3 times daily  and Mounjaro Recent Labs  Lab 01/09/24 1946 01/09/24 2326 01/10/24 0402 01/10/24 0727 01/10/24 1154  GLUCAP 367* 371* 333* 277* 228*  - Increase Semglee  from 20 to 30 units daily - Increase SSI from moderate to resistant - Add NovoLog  4 units 3 times daily with meals - Further adjustment as appropriate. -Consult dietitian   Essential hypertension: BP within acceptable range.  Not on antihypertensive meds at home - P.o. hydralazine as needed.   Lower back pain with radiculopathy - Tylenol  1 g  every 6 hours while awake - Resume home Lyrica  at 50 mg twice daily -Continue holding baclofen  - PT/OT goal   History of endometrial cancer: Grade 1. S/p TAH/BSO/LN in 2021   Polypharmacy -Continue holding baclofen  and Elavil  -Reduce Lyrica  as above   Morbid obesity Body mass index is 40.62 kg/m. - Consult dietitian.         DVT prophylaxis:  heparin  injection 5,000 Units Start: 01/09/24 2200  Code Status: Full code Family Communication: Updated patient's husband at bedside Level of care: Telemetry Status is: Observation The patient will require care spanning > 2 midnights and should be moved to inpatient because: AKI   Final disposition: Home   55 minutes with more than 50% spent in reviewing records, counseling patient/family and coordinating care.   Sch Meds:  Scheduled Meds:  acetaminophen   1,000 mg Oral Q6H WA   heparin   5,000 Units Subcutaneous Q8H   insulin  aspart  0-20 Units Subcutaneous TID WC   insulin  aspart  0-5 Units Subcutaneous QHS   insulin  aspart  4 Units Subcutaneous TID WC   insulin  glargine-yfgn  30 Units Subcutaneous Daily   naLOXone (NARCAN)  injection  0.4 mg Intravenous Once   pregabalin   50 mg Oral BID   Continuous Infusions:  sodium chloride  150 mL/hr at 01/10/24 0253   PRN Meds:.hydrALAZINE, ondansetron  **OR** ondansetron  (ZOFRAN ) IV, polyethylene glycol, senna-docusate  Antimicrobials: Anti-infectives (From admission, onward)    None        I have personally reviewed the following labs and images: CBC: Recent Labs  Lab 01/09/24 1051 01/10/24 0531  WBC 4.8 5.6  NEUTROABS 1.6*  --   HGB 11.7* 10.1*  HCT 35.0* 31.0*  MCV 83.9 85.4  PLT 201 195   BMP &GFR Recent Labs  Lab 01/09/24 1051 01/09/24 1644 01/10/24 0531  NA 133* 133* 137  K 4.5 4.6 4.4  CL 93* 97* 100  CO2 28 24 26   GLUCOSE 428* 472* 330*  BUN 73* 78* 64*  CREATININE 2.87* 2.58* 2.13*  CALCIUM  9.3 8.9 9.0  MG  --  1.9 2.0  PHOS  --  4.5 3.7    Estimated Creatinine Clearance: 27.8 mL/min (A) (by C-G formula based on SCr of 2.13 mg/dL (H)). Liver & Pancreas: Recent Labs  Lab 01/09/24 1051 01/09/24 1644 01/10/24 0531  AST 23  --   --   ALT 29  --   --   ALKPHOS 71  --   --   BILITOT 0.7  --   --   PROT 8.2*  --   --   ALBUMIN 4.1 3.4* 3.3*   No results for input(s): "LIPASE", "AMYLASE" in the last 168 hours. Recent Labs  Lab 01/09/24 1051  AMMONIA 16   Diabetic: Recent Labs    01/09/24 1644  HGBA1C 9.4*   Recent Labs  Lab 01/09/24 1946 01/09/24 2326 01/10/24 0402 01/10/24 0727 01/10/24 1154  GLUCAP 367* 371* 333* 277* 228*   Cardiac Enzymes: No  results for input(s): "CKTOTAL", "CKMB", "CKMBINDEX", "TROPONINI" in the last 168 hours. No results for input(s): "PROBNP" in the last 8760 hours. Coagulation Profile: No results for input(s): "INR", "PROTIME" in the last 168 hours. Thyroid  Function Tests: Recent Labs    01/09/24 1051  TSH 1.551   Lipid Profile: No results for input(s): "CHOL", "HDL", "LDLCALC", "TRIG", "CHOLHDL", "LDLDIRECT" in the last 72 hours. Anemia Panel: No results for input(s): "VITAMINB12", "FOLATE", "FERRITIN", "TIBC", "IRON", "RETICCTPCT" in the last 72 hours. Urine analysis:    Component Value Date/Time   COLORURINE STRAW (A) 02/16/2022 2339   APPEARANCEUR CLEAR 02/16/2022 2339   LABSPEC 1.010 02/16/2022 2339   PHURINE 6.0 02/16/2022 2339   GLUCOSEU NEGATIVE 02/16/2022 2339   HGBUR NEGATIVE 02/16/2022 2339   BILIRUBINUR NEGATIVE 02/16/2022 2339   KETONESUR NEGATIVE 02/16/2022 2339   PROTEINUR NEGATIVE 02/16/2022 2339   UROBILINOGEN 0.2 07/28/2021 1502   NITRITE NEGATIVE 02/16/2022 2339   LEUKOCYTESUR NEGATIVE 02/16/2022 2339   Sepsis Labs: Invalid input(s): "PROCALCITONIN", "LACTICIDVEN"  Microbiology: No results found for this or any previous visit (from the past 240 hours).  Radiology Studies: No results found.     Leisa Gault T. Giuliana [facility]  If  7PM-7AM, please contact night-coverage [email or web address] 01/10/2024, 12:33 PM

## 2024-01-10 NOTE — Inpatient Diabetes Management (Signed)
 Inpatient Diabetes Program Recommendations  AACE/ADA: New Consensus Statement on Inpatient Glycemic Control (2015)  Target Ranges:  Prepandial:   less than 140 mg/dL      Peak postprandial:   less than 180 mg/dL (1-2 hours)      Critically ill patients:  140 - 180 mg/dL   Lab Results  Component Value Date   GLUCAP 277 (H) 01/10/2024   HGBA1C 9.4 (H) 01/09/2024    Review of Glycemic Control  Diabetes history: DM2 Outpatient Diabetes medications: Tresiba 75 BID and Novolin R 75 TID Current orders for Inpatient glycemic control: Semglee  30 daily, Novolog  0-15 Q4H, has FS Libre  HgbA1C - 9.4% CBGs 333, 330, 277  Inpatient Diabetes Program Recommendations:    Consider increasing Semglee  to 40 units daily  Consider increasing Novolog  to 10 units TID with meals if eating > 50%  Pt confirms Tresiba 75 BID and Novolin R 75 TID. Titrate insulin  as needed. Will discuss HgbA1C of 9.4% in am.   Thank you. Joni Net, RD, LDN, CDCES Inpatient Diabetes Coordinator [phone]

## 2024-01-10 NOTE — Evaluation (Signed)
 Occupational Therapy Evaluation Patient Details Name: Christy Carpenter MRN: [id number] DOB: 05/14/1956 Today's Date: 01/10/2024   History of Present Illness   Patient is a 68 year old female who presented on 5/25 with dizziness and confusion. Patient was admitted with AKI, drug induced acute encephalopathy, uncontrolled IDDM II with hyperglycemia. PMH: low back pain with radiculopathy, endometrial cancer, morbid obesity.     Clinical Impressions Patient is a 68 year old female who was admitted or above. Patient was living at home with husband with supervision for some ADLs at baseline. Currently, patient is having increased time to process cues during session. Patient was +1 to transfer to recliner with RW with increased multimodal cues to transition into sitting. Patient was noted to have decreased functional activity tolerance, decreased endurance, decreased standing balance, decreased safety awareness, and decreased knowledge of AD/AE impacting participation in ADLs. Plan is to d/c home with [facility] services and husband support when medically stable.      If plan is discharge home, recommend the following:   A little help with walking and/or transfers;A little help with bathing/dressing/bathroom;Assistance with cooking/housework;Direct supervision/assist for medications management;Assist for transportation;Help with stairs or ramp for entrance;Direct supervision/assist for financial management     Functional Status Assessment   Patient has had a recent decline in their functional status and demonstrates the ability to make significant improvements in function in a reasonable and predictable amount of time.     Equipment Recommendations   None recommended by OT      Precautions/Restrictions   Precautions Precautions: Fall Restrictions Weight Bearing Restrictions Per Provider Order: No     Mobility Bed Mobility Overal bed mobility: Needs Assistance Bed Mobility: Supine to Sit      Supine to sit: HOB elevated, Contact guard     General bed mobility comments: with increased time         Balance Overall balance assessment: Mild deficits observed, not formally tested                 ADL either performed or assessed with clinical judgement   ADL Overall ADL's : Needs assistance/impaired Eating/Feeding: Modified independent;Sitting   Grooming: Sitting;Minimal assistance   Upper Body Bathing: Sitting;Minimal assistance   Lower Body Bathing: Sitting/lateral leans;Maximal assistance   Upper Body Dressing : Sitting;Minimal assistance   Lower Body Dressing: Sitting/lateral leans;Maximal assistance Lower Body Dressing Details (indicate cue type and reason): uanble to figure four legs. able to reach R sock and adjust it sitting EOB Toilet Transfer: Minimal assistance;Stand-pivot;Rolling walker (2 wheels) Toilet Transfer Details (indicate cue type and reason): to recliner with increased time. Toileting- Clothing Manipulation and Hygiene: Sit to/from stand;Maximal assistance               Vision Baseline Vision/History: 1 Wears glasses Vision Assessment?: No apparent visual deficits            Pertinent Vitals/Pain Pain Assessment Pain Assessment: No/denies pain     Extremity/Trunk Assessment Upper Extremity Assessment Upper Extremity Assessment: Overall WFL for tasks assessed       Cervical / Trunk Assessment Cervical / Trunk Assessment: Other exceptions Cervical / Trunk Exceptions: noted to have body habitus effecting cervical ROM   Communication     Cognition Arousal: Alert Behavior During Therapy: Anxious Cognition: Difficult to assess             OT - Cognition Comments: patient was noted to have some confusion asking therapist about plan multiple times. patient's husband was there to help  with encouragement and cues. patient had a hard time processing sitting down in chair.                                     Home Living Family/patient expects to be discharged to:: Private residence Living Arrangements: Spouse/significant other Available Help at Discharge: Family;Available 24 hours/day   Home Access: Stairs to enter Entrance Stairs-Number of Steps: 3   Home Layout: Two level     Bathroom Shower/Tub: Tub/shower unit;Walk-in shower         Home Equipment: Cane - single point;Grab bars - tub/shower          Prior Functioning/Environment Prior Level of Function : Independent/Modified Independent               ADLs Comments: husband reported patient is sendentary at home most of the time. able to participate in ADLs supervision for getting in and out of shower.    OT Problem List: Impaired balance (sitting and/or standing);Decreased knowledge of precautions;Decreased safety awareness;Decreased activity tolerance;Decreased knowledge of use of DME or AE   OT Treatment/Interventions: Self-care/ADL training;DME and/or AE instruction;Therapeutic activities;Balance training;Energy conservation;Patient/family education      OT Goals(Current goals can be found in the care plan section)   Acute Rehab OT Goals Patient Stated Goal: to get better OT Goal Formulation: With patient/family Time For Goal Achievement: 01/24/24 Potential to Achieve Goals: Fair   OT Frequency:  Min 2X/week       AM-PAC OT "6 Clicks" Daily Activity     Outcome Measure Help from another person eating meals?: A Little Help from another person taking care of personal grooming?: A Little Help from another person toileting, which includes using toliet, bedpan, or urinal?: A Lot Help from another person bathing (including washing, rinsing, drying)?: A Lot Help from another person to put on and taking off regular upper body clothing?: A Little Help from another person to put on and taking off regular lower body clothing?: A Lot 6 Click Score: 15   End of Session Equipment Utilized During Treatment: Gait  belt;Rolling walker (2 wheels) Nurse Communication: Mobility status  Activity Tolerance: Patient tolerated treatment well Patient left: in chair;with call bell/phone within reach  OT Visit Diagnosis: Unsteadiness on feet (R26.81);Other abnormalities of gait and mobility (R26.89);Muscle weakness (generalized) (M62.81)                Time: 1610-9604 OT Time Calculation (min): 21 min Charges:  OT General Charges $OT Visit: 1 Visit OT Evaluation $OT Eval Low Complexity: 1 Low  Audelia Knape OTR/L, MS Acute Rehabilitation Department Office# [phone]   Jame Maze 01/10/2024, 12:35 PM

## 2024-01-10 NOTE — Evaluation (Signed)
 Physical Therapy Evaluation Patient Details Name: Christy Carpenter MRN: [id number] DOB: 1956/03/22 Today's Date: 01/10/2024  History of Present Illness  Patient is a 68 year old female who presented on 5/25 with dizziness and confusion. Patient was admitted with AKI, drug induced acute encephalopathy, uncontrolled IDDM II with hyperglycemia. PMH: low back pain with radiculopathy, endometrial cancer, morbid obesity.  Clinical Impression  Pt admitted with above diagnosis.  Pt reports independence at baseline, family states that pt is generally sedentary.   Pt c/o dizziness which improved during session; pt able to amb ~ 16' with RW and min assist. Anticipate steady progress in acute setting. Likely will not need f/u, may need RW depending on progress.   Pt currently with functional limitations due to the deficits listed below (see PT Problem List). Pt will benefit from acute skilled PT to increase their independence and safety with mobility to allow discharge.           If plan is discharge home, recommend the following: Help with stairs or ramp for entrance   Can travel by private vehicle        Equipment Recommendations Rolling walker (2 wheels) (vs none)  Recommendations for Other Services       Functional Status Assessment Patient has had a recent decline in their functional status and demonstrates the ability to make significant improvements in function in a reasonable and predictable amount of time.     Precautions / Restrictions Precautions Precautions: Fall Restrictions Weight Bearing Restrictions Per Provider Order: No      Mobility  Bed Mobility Overal bed mobility: Needs Assistance Bed Mobility: Supine to Sit, Sit to Supine     Supine to sit: HOB elevated, Contact guard Sit to supine: Supervision   General bed mobility comments: with increased time; dizziness initially (improved with focal point and incr time)    Transfers Overall transfer level: Needs  assistance Equipment used: Rolling walker (2 wheels) Transfers: Sit to/from Stand Sit to Stand: Contact guard assist, Min assist           General transfer comment: STS repeated x3 for instruction and activity tol    Ambulation/Gait Ambulation/Gait assistance: Contact guard assist, Min assist Gait Distance (Feet): 16 Feet Assistive device: Rolling walker (2 wheels) Gait Pattern/deviations: Step-through pattern, Wide base of support       General Gait Details: intermittent assist to manage RW, cues for upward gaze, posture and RW position  Stairs            Wheelchair Mobility     Tilt Bed    Modified Rankin (Stroke Patients Only)       Balance Overall balance assessment: Mild deficits observed, not formally tested                                           Pertinent Vitals/Pain Pain Assessment Pain Assessment: No/denies pain    Home Living Family/patient expects to be discharged to:: Private residence Living Arrangements: Spouse/significant other Available Help at Discharge: Family;Available 24 hours/day Type of Home: House Home Access: Stairs to enter Entrance Stairs-Rails: Right;Left;Can reach both Entrance Stairs-Number of Steps: 3   Home Layout: Two level Home Equipment: Cane - single point;Grab bars - tub/shower      Prior Function Prior Level of Function : Independent/Modified Independent             Mobility Comments: independent--sedentary  per dtr ADLs Comments: husband reported patient is sendentary at home most of the time. able to participate in ADLs supervision for getting in and out of shower.     Extremity/Trunk Assessment   Upper Extremity Assessment Upper Extremity Assessment: Defer to OT evaluation;Overall WFL for tasks assessed    Lower Extremity Assessment Lower Extremity Assessment: Overall WFL for tasks assessed    Cervical / Trunk Assessment Cervical / Trunk Assessment: Other exceptions Cervical  / Trunk Exceptions: noted to have body habitus effecting cervical ROM  Communication   Communication Communication: No apparent difficulties    Cognition Arousal: Alert Behavior During Therapy: WFL for tasks assessed/performed   PT - Cognitive impairments: No apparent impairments                         Following commands: Intact       Cueing Cueing Techniques: Verbal cues     General Comments      Exercises Other Exercises Other Exercises: educated on STS 5x for improved activity tol Other Exercises: marching in standing x 10 bil   Assessment/Plan    PT Assessment Patient needs continued PT services  PT Problem List Decreased strength;Decreased activity tolerance;Decreased balance;Decreased knowledge of use of DME;Decreased mobility       PT Treatment Interventions DME instruction;Therapeutic exercise;Gait training;Functional mobility training;Therapeutic activities;Patient/family education    PT Goals (Current goals can be found in the Care Plan section)  Acute Rehab PT Goals PT Goal Formulation: With patient Time For Goal Achievement: 01/24/24 Potential to Achieve Goals: Good    Frequency Min 3X/week     Co-evaluation               AM-PAC PT "6 Clicks" Mobility  Outcome Measure Help needed turning from your back to your side while in a flat bed without using bedrails?: A Little Help needed moving from lying on your back to sitting on the side of a flat bed without using bedrails?: A Little Help needed moving to and from a bed to a chair (including a wheelchair)?: A Little Help needed standing up from a chair using your arms (e.g., wheelchair or bedside chair)?: A Little Help needed to walk in hospital room?: A Little Help needed climbing 3-5 steps with a railing? : A Little 6 Click Score: 18    End of Session Equipment Utilized During Treatment: Gait belt Activity Tolerance: Patient tolerated treatment well Patient left: with call  bell/phone within reach;in bed;with family/visitor present;with bed alarm set   PT Visit Diagnosis: Other abnormalities of gait and mobility (R26.89)    Time: 1610-9604 PT Time Calculation (min) (ACUTE ONLY): 32 min   Charges:   PT Evaluation $PT Eval Low Complexity: 1 Low PT Treatments $Gait Training: 8-22 mins $Therapeutic Activity: 8-22 mins PT General Charges $$ ACUTE PT VISIT: 1 Visit         Maddelyn Rocca, PT  Acute Rehab Dept [facility]) [phone]  01/10/2024   [facility] 01/10/2024, 3:44 PM

## 2024-01-10 NOTE — Progress Notes (Signed)
 SLP Cancellation Note  Patient Details Name: Christy Carpenter MRN: [id number] DOB: 05-Apr-1956   Cancelled treatment:       Reason Eval/Treat Not Completed: Other (comment) (patient has passed [place] and per RN is tolerating regular solids, thin liquids without difficulty.) SLP will s/o.  Jacqualine Mater, MA, CCC-SLP Speech Therapy

## 2024-01-11 DIAGNOSIS — N179 Acute kidney failure, unspecified: Secondary | ICD-10-CM | POA: Diagnosis not present

## 2024-01-11 LAB — RENAL FUNCTION PANEL
Albumin: 3.5 g/dL (ref 3.5–5.0)
Anion gap: 5 (ref 5–15)
BUN: 40 mg/dL — ABNORMAL HIGH (ref 8–23)
CO2: 26 mmol/L (ref 22–32)
Calcium: 9 mg/dL (ref 8.9–10.3)
Chloride: 102 mmol/L (ref 98–111)
Creatinine, Ser: 1.75 mg/dL — ABNORMAL HIGH (ref 0.44–1.00)
GFR, Estimated: 32 mL/min — ABNORMAL LOW (ref >60)
Glucose, Bld: 268 mg/dL — ABNORMAL HIGH (ref 70–99)
Phosphorus: 2.7 mg/dL (ref 2.5–4.6)
Potassium: 4.3 mmol/L (ref 3.5–5.1)
Sodium: 133 mmol/L — ABNORMAL LOW (ref 135–145)

## 2024-01-11 LAB — CBC
HCT: 33 % — ABNORMAL LOW (ref 36.0–46.0)
Hemoglobin: 10.9 g/dL — ABNORMAL LOW (ref 12.0–15.0)
MCH: 28.2 pg (ref 26.0–34.0)
MCHC: 33 g/dL (ref 30.0–36.0)
MCV: 85.3 fL (ref 80.0–100.0)
Platelets: 191 10*3/uL (ref 150–400)
RBC: 3.87 MIL/uL (ref 3.87–5.11)
RDW: 13.7 % (ref 11.5–15.5)
WBC: 4.9 10*3/uL (ref 4.0–10.5)
nRBC: 0 % (ref 0.0–0.2)

## 2024-01-11 LAB — GLUCOSE, CAPILLARY
Glucose-Capillary: 239 mg/dL — ABNORMAL HIGH (ref 70–99)
Glucose-Capillary: 190 mg/dL — ABNORMAL HIGH (ref 70–99)
Glucose-Capillary: 123 mg/dL — ABNORMAL HIGH (ref 70–99)
Glucose-Capillary: 166 mg/dL — ABNORMAL HIGH (ref 70–99)

## 2024-01-11 LAB — MAGNESIUM: Magnesium: 1.9 mg/dL (ref 1.7–2.4)

## 2024-01-11 MED ORDER — INSULIN GLARGINE-YFGN 100 UNIT/ML ~~LOC~~ SOLN
50.0000 [IU] | Freq: Every day | SUBCUTANEOUS | Status: DC
  Administered 2024-01-11 – 2024-01-12 (×2): 50 [IU] via SUBCUTANEOUS
  Filled 2024-01-11 (×2): qty 0.5

## 2024-01-11 MED ORDER — AMLODIPINE BESYLATE 5 MG PO TABS
5.0000 mg | ORAL_TABLET | Freq: Every day | ORAL | Status: DC
  Administered 2024-01-11 – 2024-01-12 (×2): 5 mg via ORAL
  Filled 2024-01-11 (×2): qty 1

## 2024-01-11 MED ORDER — SODIUM CHLORIDE 0.9 % IV SOLN: INTRAVENOUS | Status: DC

## 2024-01-11 NOTE — Inpatient Diabetes Management (Signed)
 Inpatient Diabetes Program Recommendations  AACE/ADA: New Consensus Statement on Inpatient Glycemic Control (2015)  Target Ranges:  Prepandial:   less than 140 mg/dL      Peak postprandial:   less than 180 mg/dL (1-2 hours)      Critically ill patients:  140 - 180 mg/dL   Lab Results  Component Value Date   GLUCAP 190 (H) 01/11/2024   HGBA1C 9.4 (H) 01/09/2024    Review of Glycemic Control  Diabetes history: DM2 Outpatient Diabetes medications: Tresiba 75 BID and Novolin R 75 TID Current orders for Inpatient glycemic control: Semglee  50 daily, Novolog  0-20 TID with meals and 0-5 HS + 4 units TID, has FS Libre   HgbA1C - 9.4% Endo - [place]  Inpatient Diabetes Program Recommendations:   Consider increasing Novolog  to 6 units TID  Continue to titate Semglee  if FBS > 180   Spoke with pt and daughter at bedside regarding her HgbA1C of 9.4%. Pt states she does not exercise and tries to eat healthy most of the time. Educated on The Plate Method, CHO's, portion control, avoiding caloric beverages, CBGs at home fasting and mid afternoon, F/U with PCP every 3 months, bring meter to PCP office, long and short term complications of uncontrolled BG, and importance of exercise.  Pt to f/u with Endo after discharge. Answered all questions.  Thank you. Joni Net, RD, LDN, CDCES Inpatient Diabetes Coordinator [phone]

## 2024-01-11 NOTE — Progress Notes (Signed)
 Nutrition Note  RD consulted for nutrition education regarding diabetes.   Lab Results  Component Value Date   HGBA1C 9.4 (H) 01/09/2024    RD provided "Carbohydrate Counting for People with Diabetes" handout from the Academy of [employer]etics. Discussed different food groups and their effects on blood sugar, emphasizing carbohydrate-containing foods. Provided list of carbohydrates and recommended serving sizes of common foods.  Discussed importance of controlled and consistent carbohydrate intake throughout the day. Provided examples of ways to balance meals/snacks and encouraged intake of high-fiber, whole grain complex carbohydrates. Teach back method used.  Expect good compliance. Pt engaged in education. Not interested in outpatient education but was encouraged to request from PCP if desired in the future.  Body mass index is 40.62 kg/m. Pt meets criteria for morbid obesity based on current BMI.  Current diet order is CHO modified, patient is consuming approximately 100% of meals at this time. Labs and medications reviewed. No further nutrition interventions warranted at this time.  If additional nutrition issues arise, please re-consult RD.  Arna Better, MS, RD, LDN Inpatient Clinical Dietitian Contact via Secure chat

## 2024-01-11 NOTE — Progress Notes (Signed)
   01/11/24 1028  TOC Brief Assessment  Insurance and Status Reviewed  Patient has primary care physician Yes  Home environment has been reviewed Single family home  Prior level of function: Independent  Prior/Current Home Services No current home services  Social Drivers of Health Review SDOH reviewed no interventions necessary  Readmission risk has been reviewed Yes (14% Green)  Transition of care needs no transition of care needs at this time   Met with pt and she denies any SDOH needs, no DME or HH needs. Spoke with pt about a RW and she denies the need for a RW at this time. There are no TOC needs at this time.

## 2024-01-11 NOTE — Plan of Care (Addendum)
 Patient's SBP 160-170s, given PRN Hydralazine x1 (Provider aware). All other VSS. BG 337 at bedtime. Patient ambulating with standby assist using walker to bathroom. LBM 5/26. No acute events overnight.  Problem: Education: Goal: Ability to describe self-care measures that may prevent or decrease complications (Diabetes Survival Skills Education) will improve Outcome: Progressing   Problem: Metabolic: Goal: Ability to maintain appropriate glucose levels will improve Outcome: Progressing   Problem: Nutritional: Goal: Maintenance of adequate nutrition will improve Outcome: Progressing   Problem: Education: Goal: Knowledge of General Education information will improve Description: Including pain rating scale, medication(s)/side effects and non-pharmacologic comfort measures Outcome: Progressing   Problem: Health Behavior/Discharge Planning: Goal: Ability to manage health-related needs will improve Outcome: Progressing   Problem: Clinical Measurements: Goal: Ability to maintain clinical measurements within normal limits will improve Outcome: Progressing Goal: Will remain free from infection Outcome: Progressing   Problem: Pain Managment: Goal: General experience of comfort will improve and/or be controlled Outcome: Progressing   Problem: Safety: Goal: Ability to remain free from injury will improve Outcome: Progressing

## 2024-01-11 NOTE — Plan of Care (Signed)

## 2024-01-11 NOTE — Progress Notes (Signed)
 PROGRESS NOTE  Christy Carpenter  DOB: September 10, 1955  PCP: Dorena Gander, MD ZOX:[id number]  DOA: 01/09/2024  LOS: 1 day  Hospital Day: 3  Brief narrative: Christy Carpenter is a 69 y.o. female with PMH significant for DM2, HTN, HLD, morbid obesity, hypothyroidism, osteoarthritis, chronic back pain with radiculopathy, grade I endometrial cancer s/p TAH/BSO/LN in 06/2020 5/25, patient presented to the ED with complaint of dizziness, confusion.  Of note, patient was recently started on baclofen  and diclofenac  when she was seen in an urgent care center.  She was also previously on Lyrica  and amitriptyline .  It was suspected that patient was having drug-induced acute encephalopathy from polypharmacy.  Workup in the ED showed AKI with creatinine elevated 2.87 compared to 0.8 from 2023. CT abdomen and pelvis showed hepatic steatosis and cholelithiasis but no urinary obstruction.   She was started on IV fluid  Admitted to [facility].    Subjective: Patient was seen and examined this morning.  Pleasant elderly African-American female.  Lying down on bed.  Not in distress.  Mental status is improving.  Husband at bedside. Was receiving IV fluid until yesterday afternoon. Labs from this morning with creatinine better at 1.75.  Assessment and plan: AKI Cr 0.8 in 2023.  Presented with creatinine elevated to 2.87.  Probably related to use of diclofenac .  Diclofenac  currently on hold.  Given IV hydration with improvement in creatinine.  1.75 today.  I will continue IV fluid till tomorrow and repeat labs in AM. No signs of obstruction on CT abdomen pelvis. Recent Labs    01/09/24 1051 01/09/24 1644 01/10/24 0531 01/11/24 0446  BUN 73* 78* 64* 40*  CREATININE 2.87* 2.58* 2.13* 1.75*  CO2 28 24 26 26     Acute toxic metabolic encephalopathy -drug-induced Patient was recently started on high-dose baclofen .  Was already on amitriptyline  and Lyrica .   Likely had polypharmacy.   CT head without any acute finding.    Ammonia, TSH and VBG unrevealing.   Gradually improving mental status.   Baclofen  and amitriptyline  are on hold. Continue Lyrica .   Continue reorientation and delirium precaution Fall precaution.  Type 2 diabetes mellitus uncontrolled with hyperglycemia A1c 9.4 on 01/09/2024 PTA meds-Tresiba 75 units twice daily, NovoLog  75 units 3 times daily and Mounjaro Currently on Semglee  30 units daily along with insulin  aspart 4 units 3 times daily and SSI/Accu-Cheks. Blood sugar level running elevated over 200 consistently. This morning, increased Semglee  dose to 50 units.  To continue scheduled and sliding scale insulin  Premeal Recent Labs  Lab 01/10/24 1154 01/10/24 1609 01/10/24 2115 01/11/24 0741 01/11/24 1130  GLUCAP 228* 277* 337* 239* 190*   Essential hypertension:  BP running elevated.  Does not seem to be on any antihypertensives at home.   I started on amlodipine .  Losartan  would benefit in the setting of diabetes but avoid for now because of AKI.   IV hydralazine as needed   Lower back pain with radiculopathy Continue pain management with Tylenol  1 g every 6 hours while awake, Lyrica  50 mg twice daily PT/OT   History of endometrial cancer Grade 1. S/p TAH/BSO/LN in 2021  Morbid Obesity  Body mass index is 40.62 kg/m. Patient has been advised to make an attempt to improve diet and exercise patterns to aid in weight loss.   Mobility: Encourage ambulation.  Independent at home  Goals of care   Code Status: Full Code     DVT prophylaxis:  heparin  injection 5,000 Units Start: 01/09/24 2200  Antimicrobials: None Fluid: NS at 75 mL/h Consultants: None Family Communication: Husband at bedside  Status: Inpatient Level of care:  Telemetry   Patient is from: Home Needs to continue in-hospital care: Needs IV fluid, needs renal function monitoring Anticipated d/c to: Hopefully home in 1 to 2 days.    Diet:  Diet Order             Diet Carb Modified Fluid  consistency: Thin; Room service appropriate? Yes  Diet effective now                   Scheduled Meds:  acetaminophen   1,000 mg Oral Q6H WA   amLODipine   5 mg Oral Daily   heparin   5,000 Units Subcutaneous Q8H   insulin  aspart  0-20 Units Subcutaneous TID WC   insulin  aspart  0-5 Units Subcutaneous QHS   insulin  aspart  4 Units Subcutaneous TID WC   insulin  glargine-yfgn  50 Units Subcutaneous Daily   naLOXone (NARCAN)  injection  0.4 mg Intravenous Once   pregabalin   50 mg Oral BID    PRN meds: hydrALAZINE, ondansetron  **OR** ondansetron  (ZOFRAN ) IV, polyethylene glycol, senna-docusate   Infusions:   sodium chloride  75 mL/hr at 01/11/24 1006    Antimicrobials: Anti-infectives (From admission, onward)    None       Objective: Vitals:   01/11/24 0619 01/11/24 1357  BP: (!) 176/84 (!) 169/85  Pulse: 83 82  Resp:  16  Temp:  97.7 F (36.5 C)  SpO2: 99% 95%    Intake/Output Summary (Last 24 hours) at 01/11/2024 1415 Last data filed at 01/11/2024 0900 Gross per 24 hour  Intake 600 ml  Output --  Net 600 ml   Filed Weights   01/09/24 1049  Weight: 97.5 kg   Weight change:  Body mass index is 40.62 kg/m.   Physical Exam: General exam: Pleasant, elderly African-American female.  Morbidly obese Skin: No rashes, lesions or ulcers. HEENT: Atraumatic, normocephalic, no obvious bleeding Lungs: Clear to auscultation bilaterally,  CVS: S1, S2, no murmur,   GI/Abd: Soft, nontender, nondistended, bowel sound present,   CNS: Alert, awake, oriented x 3 Psychiatry: Mood appropriate,  Extremities: No pedal edema, no calf tenderness,   Data Review: I have personally reviewed the laboratory data and studies available.  F/u labs ordered Unresulted Labs (From admission, onward)     Start     Ordered   01/12/24 0500  Basic metabolic panel with GFR  Tomorrow morning,   R       Question:  Specimen collection method  Answer:  Lab=Lab collect   01/11/24 0929    01/12/24 0500  CBC with Differential/Platelet  Tomorrow morning,   R       Question:  Specimen collection method  Answer:  Lab=Lab collect   01/11/24 0929   01/09/24 1050  Rapid urine drug screen (hospital performed)  ONCE - STAT,   STAT        01/09/24 1053   01/09/24 1049  Urinalysis, Routine w reflex microscopic -Urine, Clean Catch  Once,   URGENT       Question:  Specimen Source  Answer:  Urine, Clean Catch   01/09/24 1049            Signed, Hoyt Macleod, MD [facility] 01/11/2024

## 2024-01-12 DIAGNOSIS — N179 Acute kidney failure, unspecified: Secondary | ICD-10-CM | POA: Diagnosis not present

## 2024-01-12 LAB — CBC WITH DIFFERENTIAL/PLATELET
Abs Immature Granulocytes: 0.01 10*3/uL (ref 0.00–0.07)
Basophils Absolute: 0 10*3/uL (ref 0.0–0.1)
Basophils Relative: 1 %
Eosinophils Absolute: 0.1 10*3/uL (ref 0.0–0.5)
Eosinophils Relative: 2 %
HCT: 34.5 % — ABNORMAL LOW (ref 36.0–46.0)
Hemoglobin: 11.4 g/dL — ABNORMAL LOW (ref 12.0–15.0)
Immature Granulocytes: 0 %
Lymphocytes Relative: 52 %
Lymphs Abs: 2.8 10*3/uL (ref 0.7–4.0)
MCH: 27.9 pg (ref 26.0–34.0)
MCHC: 33 g/dL (ref 30.0–36.0)
MCV: 84.4 fL (ref 80.0–100.0)
Monocytes Absolute: 0.4 10*3/uL (ref 0.1–1.0)
Monocytes Relative: 7 %
Neutro Abs: 2 10*3/uL (ref 1.7–7.7)
Neutrophils Relative %: 38 %
Platelets: 205 10*3/uL (ref 150–400)
RBC: 4.09 MIL/uL (ref 3.87–5.11)
RDW: 13.7 % (ref 11.5–15.5)
WBC: 5.4 10*3/uL (ref 4.0–10.5)
nRBC: 0 % (ref 0.0–0.2)

## 2024-01-12 LAB — GLUCOSE, CAPILLARY
Glucose-Capillary: 135 mg/dL — ABNORMAL HIGH (ref 70–99)
Glucose-Capillary: 164 mg/dL — ABNORMAL HIGH (ref 70–99)

## 2024-01-12 LAB — BASIC METABOLIC PANEL WITH GFR
Anion gap: 11 (ref 5–15)
BUN: 27 mg/dL — ABNORMAL HIGH (ref 8–23)
CO2: 24 mmol/L (ref 22–32)
Calcium: 10 mg/dL (ref 8.9–10.3)
Chloride: 105 mmol/L (ref 98–111)
Creatinine, Ser: 1.44 mg/dL — ABNORMAL HIGH (ref 0.44–1.00)
GFR, Estimated: 40 mL/min — ABNORMAL LOW (ref >60)
Glucose, Bld: 150 mg/dL — ABNORMAL HIGH (ref 70–99)
Potassium: 4 mmol/L (ref 3.5–5.1)
Sodium: 140 mmol/L (ref 135–145)

## 2024-01-12 MED ORDER — CARMEX CLASSIC LIP BALM EX OINT
TOPICAL_OINTMENT | CUTANEOUS | Status: DC | PRN
  Filled 2024-01-12: qty 10

## 2024-01-12 MED ORDER — AMLODIPINE BESYLATE 5 MG PO TABS: 5.0000 mg | ORAL_TABLET | Freq: Every day | ORAL | 0 refills | Status: AC

## 2024-01-12 MED ORDER — INSULIN ASPART 100 UNIT/ML IJ SOLN: 6.0000 [IU] | Freq: Three times a day (TID) | INTRAMUSCULAR | Status: DC

## 2024-01-12 NOTE — Progress Notes (Signed)
 Physical Therapy Treatment Patient Details Name: Christy Carpenter MRN: [id number] DOB: 30-Jun-1956 Today's Date: 01/12/2024   History of Present Illness Patient is a 68 year old female who presented on 5/25 with dizziness and confusion. Patient was admitted with AKI, drug induced acute encephalopathy, uncontrolled IDDM II with hyperglycemia. PMH: low back pain with radiculopathy, endometrial cancer, morbid obesity.    PT Comments  Pt agreeable to working with therapy. CGA-Min A for safe mobility on today. Encouraged pt to continue sitting up in recliner but she declined and requested assistance back to bed. Per TOC note, pt has declined RW. Recommend daily mobility with nursing and/or mobility team as able.     If plan is discharge home, recommend the following: Help with stairs or ramp for entrance;A little help with walking and/or transfers   Can travel by private vehicle        Equipment Recommendations  None recommended by PT (could benefit however pt declined RW per TOC note)    Recommendations for Other Services       Precautions / Restrictions Precautions Precautions: Fall Restrictions Weight Bearing Restrictions Per Provider Order: No     Mobility  Bed Mobility Overal bed mobility: Needs Assistance Bed Mobility: Sit to Supine       Sit to supine: Supervision, HOB elevated        Transfers Overall transfer level: Needs assistance Equipment used: Rolling walker (2 wheels), None Transfers: Sit to/from Stand Sit to Stand: Contact guard assist           General transfer comment: Cues for safety    Ambulation/Gait Ambulation/Gait assistance: Min assist, Contact guard assist Gait Distance (Feet): 75 Feet Assistive device: Rolling walker (2 wheels), None Gait Pattern/deviations: Step-through pattern, Decreased stride length       General Gait Details: Walked in hallway with RW-2 brief standing rest breaks taken. Pt denied dizziness but endorsed fatigue. Pt then  took a few steps in room without walker from sink then back to bedside-Min A to steady-pt requiring support of IV pole to steady herself as well.   Stairs             Wheelchair Mobility     Tilt Bed    Modified Rankin (Stroke Patients Only)       Balance Overall balance assessment: Needs assistance         Standing balance support: Bilateral upper extremity supported, During functional activity, Reliant on assistive device for balance Standing balance-Leahy Scale: Poor                              Communication Communication Communication: No apparent difficulties  Cognition Arousal: Alert Behavior During Therapy: WFL for tasks assessed/performed   PT - Cognitive impairments: No apparent impairments                         Following commands: Intact      Cueing Cueing Techniques: Verbal cues  Exercises      General Comments        Pertinent Vitals/Pain Pain Assessment Pain Assessment: Faces Faces Pain Scale: Hurts a little bit Pain Intervention(s): Premedicated before session    Home Living                          Prior Function            PT Goals (  current goals can now be found in the care plan section) Progress towards PT goals: Progressing toward goals    Frequency    Min 3X/week      PT Plan      Co-evaluation              AM-PAC PT "6 Clicks" Mobility   Outcome Measure  Help needed turning from your back to your side while in a flat bed without using bedrails?: None Help needed moving from lying on your back to sitting on the side of a flat bed without using bedrails?: None Help needed moving to and from a bed to a chair (including a wheelchair)?: A Little Help needed standing up from a chair using your arms (e.g., wheelchair or bedside chair)?: A Little Help needed to walk in hospital room?: A Little Help needed climbing 3-5 steps with a railing? : A Little 6 Click Score: 20     End of Session Equipment Utilized During Treatment: Gait belt Activity Tolerance: Patient tolerated treatment well Patient left: in bed;with call bell/phone within reach;with bed alarm set   PT Visit Diagnosis: Unsteadiness on feet (R26.81);Difficulty in walking, not elsewhere classified (R26.2)     Time: 8295-6213 PT Time Calculation (min) (ACUTE ONLY): 17 min  Charges:    $Gait Training: 8-22 mins PT General Charges $$ ACUTE PT VISIT: 1 Visit              Tanda Falter, PT Acute Rehabilitation  Office: [phone]

## 2024-01-12 NOTE — Discharge Summary (Signed)
 Physician Discharge Summary  Christy Carpenter YQM:[id number] DOB: July 05, 1956 DOA: 01/09/2024  PCP: Dorena Gander, MD  Admit date: 01/09/2024 Discharge date: 01/12/2024  Admitted From: home Discharge disposition: home  Recommendations at discharge:  Encourage oral hydration at home.  Follow-up with PCP as an outpatient in 1 to 2 weeks for repeat metabolic panel.  Torsemide has been held.  If renal function improves, can consider resuming torsemide as well as addition of losartan  for blood pressure and renal protective effect. Encourage compliance to insulin  use.  Encourage lifestyle changes and follow-up with PCP for further insulin  adjustment Baclofen  has been stopped.  Can continue Lyrica  and amitriptyline  as before.   Brief narrative: Christy Carpenter is a 68 y.o. female with PMH significant for DM2, HTN, HLD, morbid obesity, hypothyroidism, osteoarthritis, chronic back pain with radiculopathy, grade I endometrial cancer s/p TAH/BSO/LN in 06/2020 5/25, patient presented to the ED with complaint of dizziness, confusion.  Of note, patient was recently started on baclofen  and diclofenac  when she was seen in an urgent care center.  She was also previously on Lyrica  and amitriptyline .  It was suspected that patient was having drug-induced acute encephalopathy from polypharmacy.  Workup in the ED showed AKI with creatinine elevated 2.87 compared to 0.8 from 2023. CT abdomen and pelvis showed hepatic steatosis and cholelithiasis but no urinary obstruction.   She was started on IV fluid  Admitted to [facility].    Subjective: Patient was seen and examined this morning.  Lying down in bed. Not in distress. Feels ready for discharge to home.  Hospital course: AKI Cr 0.8 in 2023.  Presented with creatinine elevated to 2.87.  Probably related to use of diclofenac .  Diclofenac  currently on hold.   No signs of obstruction on CT abdomen pelvis. Creatinine better at 1.44 today.  Given the rate of improvement,  I believe renal function will continue to improve with oral hydration at home. Recent Labs    01/09/24 1051 01/09/24 1644 01/10/24 0531 01/11/24 0446 01/12/24 0553  BUN 73* 78* 64* 40* 27*  CREATININE 2.87* 2.58* 2.13* 1.75* 1.44*  CO2 28 24 26 26 24     Acute toxic metabolic encephalopathy -drug-induced Patient was recently started on high-dose baclofen . Was already on amitriptyline  and Lyrica .   Likely had polypharmacy.   CT head without any acute finding.   Ammonia, TSH and VBG unrevealing.   Gradually improving mental status. Baclofen  has been stopped Continue amitriptyline  and Lyrica .    Type 2 diabetes mellitus uncontrolled with hyperglycemia A1c 9.4 on 01/09/2024 PTA meds-Tresiba 75 units twice daily, NovoLog  75 units 3 times daily and Mounjaro Currently requiring less insulin  in the hospital for blood sugar control.  However patient is not eating much because of the taste of the hospital food. Continue previous regimen at home.  Encourage compliance, lifestyle management, and adjustment with PCP as an outpatient. Diabetes care coordinator consult appreciated. Recent Labs  Lab 01/11/24 0741 01/11/24 1130 01/11/24 1708 01/11/24 2044 01/12/24 0729  GLUCAP 239* 190* 123* 166* 135*   Essential hypertension:  BP running elevated.  Torsemide was stopped because of AKI. Blood pressure is now improving with amlodipine  5 mg daily.  Patient would benefit from losartan  in the setting of diabetes. To follow-up with PCP as an outpatient in 1 to 2 weeks.  If renal function improves down to normal, can resume torsemide.  Losartan  can also be considered as an outpatient.   Lower back pain with radiculopathy Continue pain management with Tylenol  1  g every 6 hours while awake, Lyrica  50 mg twice daily PT/OT   History of endometrial cancer Grade 1. S/p TAH/BSO/LN in 2021  Morbid Obesity  Body mass index is 40.62 kg/m. Patient has been advised to make an attempt to improve diet  and exercise patterns to aid in weight loss.   Mobility: Encourage ambulation.  Independent at home  Goals of care   Code Status: Full Code     DVT prophylaxis:  heparin  injection 5,000 Units Start: 01/09/24 2200   Antimicrobials: None Fluid: NS at 75 mL/h Consultants: None Family Communication: Husband at bedside  Status: Inpatient Level of care:  Telemetry   Patient is from: Home Needs to continue in-hospital care: Needs IV fluid, needs renal function monitoring Anticipated d/c to: Hopefully home in 1 to 2 days.    Diet:  Diet Order             Diet Carb Modified           Diet Carb Modified Fluid consistency: Thin; Room service appropriate? Yes  Diet effective now                   Scheduled Meds:  acetaminophen   1,000 mg Oral Q6H WA   amLODipine   5 mg Oral Daily   heparin   5,000 Units Subcutaneous Q8H   insulin  aspart  0-20 Units Subcutaneous TID WC   insulin  aspart  0-5 Units Subcutaneous QHS   insulin  aspart  6 Units Subcutaneous TID WC   insulin  glargine-yfgn  50 Units Subcutaneous Daily   naLOXone  (NARCAN )  injection  0.4 mg Intravenous Once   pregabalin   50 mg Oral BID    PRN meds: hydrALAZINE , lip balm, ondansetron  **OR** ondansetron  (ZOFRAN ) IV, polyethylene glycol, senna-docusate   Infusions:   sodium chloride  75 mL/hr at 01/11/24 1006    Antimicrobials: Anti-infectives (From admission, onward)    None       Objective: Vitals:   01/12/24 0436 01/12/24 0615  BP: (!) 186/82 (!) 158/83  Pulse: 81   Resp: 18   Temp: 98.5 F (36.9 C)   SpO2: 98%    No intake or output data in the 24 hours ending 01/12/24 1146  Filed Weights   01/09/24 1049  Weight: 97.5 kg   Weight change:  Body mass index is 40.62 kg/m.   Physical Exam: General exam: Pleasant, elderly African-American female.  Morbidly obese Skin: No rashes, lesions or ulcers. HEENT: Atraumatic, normocephalic, no obvious bleeding Lungs: Clear to auscultation  bilaterally,  CVS: S1, S2, no murmur,   GI/Abd: Soft, nontender, nondistended, bowel sound present,   CNS: Alert, awake, oriented x 3 Psychiatry: Mood appropriate,  Extremities: No pedal edema, no calf tenderness,   Data Review: I have personally reviewed the laboratory data and studies available.  F/u labs ordered Unresulted Labs (From admission, onward)     Start     Ordered   01/09/24 1050  Rapid urine drug screen (hospital performed)  ONCE - STAT,   STAT        01/09/24 1053   01/09/24 1049  Urinalysis, Routine w reflex microscopic -Urine, Clean Catch  Once,   URGENT       Question:  Specimen Source  Answer:  Urine, Clean Catch   01/09/24 1049            Signed, Hoyt Macleod, MD [facility] 01/12/2024

## 2024-01-12 NOTE — Care Management Important Message (Signed)
 Important Message  Patient Details  Name: Christy Carpenter MRN: [id number] Date of Birth: 06-15-1956   Important Message Given:  N/A - LOS <3 / Initial given by admissions     Neila Bally 01/12/2024, 11:49 AM

## 2024-03-06 ENCOUNTER — Encounter: Payer: Self-pay | Admitting: Physical Medicine and Rehabilitation

## 2024-03-06 ENCOUNTER — Ambulatory Visit (INDEPENDENT_AMBULATORY_CARE_PROVIDER_SITE_OTHER): Admitting: Physical Medicine and Rehabilitation

## 2024-03-06 DIAGNOSIS — M48061 Spinal stenosis, lumbar region without neurogenic claudication: Secondary | ICD-10-CM

## 2024-03-06 DIAGNOSIS — M47816 Spondylosis without myelopathy or radiculopathy, lumbar region: Secondary | ICD-10-CM

## 2024-03-06 DIAGNOSIS — G8929 Other chronic pain: Secondary | ICD-10-CM

## 2024-03-06 DIAGNOSIS — M5416 Radiculopathy, lumbar region: Secondary | ICD-10-CM | POA: Diagnosis not present

## 2024-03-06 DIAGNOSIS — M5441 Lumbago with sciatica, right side: Secondary | ICD-10-CM

## 2024-03-06 MED ORDER — DIAZEPAM 5 MG PO TABS: ORAL_TABLET | ORAL | 0 refills | Status: DC
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
## 2024-03-06 NOTE — Progress Notes (Signed)
 Pain Scale   Average Pain 5 Patient advising she has chronic lower back pain radiating to right leg.        +Driver, -BT, -Dye Allergies.

## 2024-03-13 ENCOUNTER — Telehealth: Payer: Self-pay | Admitting: Physical Medicine and Rehabilitation

## 2024-03-13 NOTE — Telephone Encounter (Signed)
 Pt called states she is in severe pain. Pt is asking for a call back as soon as possible. Pt phone number is [phone]. Pt asking for some type of pain medication

## 2024-03-14 ENCOUNTER — Other Ambulatory Visit: Payer: Self-pay | Admitting: Physical Medicine and Rehabilitation

## 2024-03-14 ENCOUNTER — Telehealth: Payer: Self-pay

## 2024-03-14 DIAGNOSIS — G894 Chronic pain syndrome: Secondary | ICD-10-CM

## 2024-03-14 MED ORDER — HYDROCODONE-ACETAMINOPHEN 5-325 MG PO TABS: 1.0000 | ORAL_TABLET | Freq: Three times a day (TID) | ORAL | 0 refills | Status: DC | PRN

## 2024-03-14 MED ORDER — HYDROCODONE-ACETAMINOPHEN 5-325 MG PO TABS
1.0000 | ORAL_TABLET | Freq: Three times a day (TID) | ORAL | 0 refills | Status: AC | PRN
Start: 2024-03-14 — End: ?

## 2024-03-30 ENCOUNTER — Ambulatory Visit: Admitting: Physical Medicine and Rehabilitation

## 2024-03-30 ENCOUNTER — Other Ambulatory Visit: Payer: Self-pay

## 2024-03-30 VITALS — BP 152/82 | HR 69

## 2024-03-30 DIAGNOSIS — M5416 Radiculopathy, lumbar region: Secondary | ICD-10-CM | POA: Diagnosis not present

## 2024-03-30 MED ORDER — METHYLPREDNISOLONE ACETATE 40 MG/ML IJ SUSP
40.0000 mg | Freq: Once | INTRAMUSCULAR | Status: AC
  Administered 2024-03-30: 40 mg

## 2024-03-30 NOTE — Progress Notes (Unsigned)
 Pain Scale   Average Pain 3 Patient advising she has chronic lower back pain that increases when walking and standing and decreases when sitting        +Driver, -BT, -Dye Allergies.
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
## 2024-04-05 NOTE — Procedures (Signed)
 Lumbar Epidural Steroid Injection - Interlaminar Approach with Fluoroscopic Guidance  Patient: Christy Carpenter      Date of Birth: [birth year] MRN: [id number] PCP: Rolinda Millman, MD      Visit Date: 03/30/2024   Universal Protocol:     Consent Given By: the patient  Position: PRONE  Additional Comments: Vital signs were monitored before and after the procedure. Patient was prepped and draped in the usual sterile fashion. The correct patient, procedure, and site was verified.   Injection Procedure Details:   Procedure diagnoses: Lumbar radiculopathy [M54.16]   Meds Administered:  Meds ordered this encounter  Medications   methylPREDNISolone  acetate (DEPO-MEDROL ) injection 40 mg     Laterality: Right  Location/Site:  L4-5  Needle: 4.5 in., 20 ga. Tuohy  Needle Placement: Paramedian epidural  Findings:   -Comments: Excellent flow of contrast into the epidural space.  Procedure Details: Using a paramedian approach from the side mentioned above, the region overlying the inferior lamina was localized under fluoroscopic visualization and the soft tissues overlying this structure were infiltrated with 4 ml. of 1% Lidocaine  without Epinephrine. The Tuohy needle was inserted into the epidural space using a paramedian approach.   The epidural space was localized using loss of resistance along with counter oblique bi-planar fluoroscopic views.  After negative aspirate for air, blood, and CSF, a 2 ml. volume of Isovue -250 was injected into the epidural space and the flow of contrast was observed. Radiographs were obtained for documentation purposes.    The injectate was administered into the level noted above.   Additional Comments:  The patient tolerated the procedure well Dressing: 2 x 2 sterile gauze and Band-Aid    Post-procedure details: Patient was observed during the procedure. Post-procedure instructions were reviewed.  Patient left the clinic in stable condition.

## 2024-05-09 ENCOUNTER — Encounter: Payer: Self-pay | Admitting: Family Medicine

## 2024-05-09 DIAGNOSIS — Z8249 Family history of ischemic heart disease and other diseases of the circulatory system: Secondary | ICD-10-CM

## 2024-05-10 ENCOUNTER — Encounter: Payer: Self-pay | Admitting: Family Medicine

## 2024-05-10 ENCOUNTER — Other Ambulatory Visit: Payer: Self-pay | Admitting: Family Medicine

## 2024-05-10 DIAGNOSIS — Z8249 Family history of ischemic heart disease and other diseases of the circulatory system: Secondary | ICD-10-CM

## 2024-05-11 ENCOUNTER — Telehealth: Payer: Self-pay | Admitting: Physical Medicine and Rehabilitation

## 2024-05-11 NOTE — Telephone Encounter (Signed)
Patient called. Returning a call to schedule with [facility].

## 2024-05-15 ENCOUNTER — Ambulatory Visit
Admission: RE | Admit: 2024-05-15 | Discharge: 2024-05-15 | Disposition: A | Discharge status: Home / Self Care | Source: Ambulatory Visit | Attending: Family Medicine | Admitting: Family Medicine

## 2024-05-15 ENCOUNTER — Encounter: Payer: Self-pay | Admitting: Physical Medicine and Rehabilitation

## 2024-05-15 ENCOUNTER — Ambulatory Visit (INDEPENDENT_AMBULATORY_CARE_PROVIDER_SITE_OTHER): Admitting: Physical Medicine and Rehabilitation

## 2024-05-15 DIAGNOSIS — M48061 Spinal stenosis, lumbar region without neurogenic claudication: Secondary | ICD-10-CM | POA: Diagnosis not present

## 2024-05-15 DIAGNOSIS — M5416 Radiculopathy, lumbar region: Secondary | ICD-10-CM | POA: Diagnosis not present

## 2024-05-15 DIAGNOSIS — G8929 Other chronic pain: Secondary | ICD-10-CM

## 2024-05-15 DIAGNOSIS — Z8249 Family history of ischemic heart disease and other diseases of the circulatory system: Secondary | ICD-10-CM

## 2024-05-15 DIAGNOSIS — M5442 Lumbago with sciatica, left side: Secondary | ICD-10-CM

## 2024-05-15 MED ORDER — TRAMADOL HCL 50 MG PO TABS: 50.0000 mg | ORAL_TABLET | Freq: Three times a day (TID) | ORAL | 0 refills | Status: AC | PRN
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
## 2024-05-15 NOTE — Progress Notes (Unsigned)
 Pain Scale   Average Pain 8 Patient        +Driver, -BT, -Dye Allergies.

## 2024-05-17 ENCOUNTER — Telehealth: Payer: Self-pay

## 2024-05-17 ENCOUNTER — Other Ambulatory Visit: Payer: Self-pay | Admitting: Physical Medicine and Rehabilitation

## 2024-05-17 MED ORDER — DIAZEPAM 5 MG PO TABS: ORAL_TABLET | ORAL | 0 refills | Status: AC

## 2024-05-17 NOTE — Telephone Encounter (Signed)
 Pre procedure medication requested

## 2024-06-06 ENCOUNTER — Other Ambulatory Visit: Payer: Self-pay | Admitting: Gastroenterology

## 2024-06-08 ENCOUNTER — Ambulatory Visit (INDEPENDENT_AMBULATORY_CARE_PROVIDER_SITE_OTHER): Admitting: Physical Medicine and Rehabilitation

## 2024-06-08 ENCOUNTER — Other Ambulatory Visit: Payer: Self-pay

## 2024-06-08 VITALS — BP 132/79 | HR 87

## 2024-06-08 DIAGNOSIS — M5416 Radiculopathy, lumbar region: Secondary | ICD-10-CM | POA: Diagnosis not present

## 2024-06-08 MED ORDER — METHYLPREDNISOLONE ACETATE 40 MG/ML IJ SUSP: 40.0000 mg | Freq: Once | INTRAMUSCULAR | Status: DC

## 2024-06-08 MED ORDER — METHYLPREDNISOLONE ACETATE 40 MG/ML IJ SUSP
40.0000 mg | Freq: Once | INTRAMUSCULAR | Status: AC
  Administered 2024-06-08: 40 mg
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
## 2024-06-08 NOTE — Progress Notes (Signed)
 Pain Scale   Average Pain 6 Patient advised she has chronic lower back pain radiating to right leg.          +Driver, -BT, -Dye Allergies.

## 2024-06-12 NOTE — Procedures (Signed)
 Lumbar Epidural Steroid Injection - Interlaminar Approach with Fluoroscopic Guidance  Patient: Christy Carpenter      Date of Birth: 1956-01-25 MRN: [id number] PCP: Rolinda Millman, MD      Visit Date: 06/08/2024   Universal Protocol:     Consent Given By: the patient  Position: PRONE  Additional Comments: Vital signs were monitored before and after the procedure. Patient was prepped and draped in the usual sterile fashion. The correct patient, procedure, and site was verified.   Injection Procedure Details:   Procedure diagnoses: Lumbar radiculopathy [M54.16]   Meds Administered:  Meds ordered this encounter  Medications   methylPREDNISolone  acetate (DEPO-MEDROL ) injection 40 mg   DISCONTD: methylPREDNISolone  acetate (DEPO-MEDROL ) injection 40 mg     Laterality: Left  Location/Site:  L4-5  Needle: 3.5 in., 20 ga. Tuohy  Needle Placement: Paramedian epidural  Findings:   -Comments: Excellent flow of contrast into the epidural space.  Procedure Details: Using a paramedian approach from the side mentioned above, the region overlying the inferior lamina was localized under fluoroscopic visualization and the soft tissues overlying this structure were infiltrated with 4 ml. of 1% Lidocaine  without Epinephrine. The Tuohy needle was inserted into the epidural space using a paramedian approach.   The epidural space was localized using loss of resistance along with counter oblique bi-planar fluoroscopic views.  After negative aspirate for air, blood, and CSF, a 2 ml. volume of Isovue -250 was injected into the epidural space and the flow of contrast was observed. Radiographs were obtained for documentation purposes.    The injectate was administered into the level noted above.   Additional Comments:  The patient tolerated the procedure well Dressing: 2 x 2 sterile gauze and Band-Aid    Post-procedure details: Patient was observed during the procedure. Post-procedure instructions  were reviewed.  Patient left the clinic in stable condition.

## 2024-06-19 ENCOUNTER — Telehealth: Payer: Self-pay | Admitting: Physical Medicine and Rehabilitation

## 2024-06-19 ENCOUNTER — Encounter: Payer: Self-pay | Admitting: Radiology

## 2024-06-19 NOTE — Telephone Encounter (Signed)
 Left message to call to discuss back pain also sent [facility] message

## 2024-06-19 NOTE — Telephone Encounter (Signed)
 Patient called and wanted to let you know about how she was doing in two weeks. She stated that she is still having pain going down her legs and she can't stand but 2 minutes. CB#[phone]

## 2024-06-23 ENCOUNTER — Encounter (HOSPITAL_COMMUNITY): Payer: Self-pay | Admitting: Gastroenterology

## 2024-06-23 NOTE — Progress Notes (Signed)
Attempted to obtain medical history via telephone, unable to reach at this time. Unable to leave voicemail to return pre surgical testing department's phone call,due to mailbox full.  

## 2024-06-30 ENCOUNTER — Ambulatory Visit (HOSPITAL_COMMUNITY)
Admission: RE | Admit: 2024-06-30 | Discharge: 2024-06-30 | Disposition: A | Discharge status: Home / Self Care | Attending: Gastroenterology | Admitting: Gastroenterology

## 2024-06-30 ENCOUNTER — Ambulatory Visit (HOSPITAL_COMMUNITY)

## 2024-06-30 ENCOUNTER — Encounter (HOSPITAL_COMMUNITY): Payer: Self-pay | Admitting: Gastroenterology

## 2024-06-30 ENCOUNTER — Other Ambulatory Visit: Payer: Self-pay

## 2024-06-30 ENCOUNTER — Encounter (HOSPITAL_COMMUNITY)
Admission: RE | Disposition: A | Discharge status: Home / Self Care | Payer: Self-pay | Source: Home / Self Care | Attending: Gastroenterology

## 2024-06-30 DIAGNOSIS — E1140 Type 2 diabetes mellitus with diabetic neuropathy, unspecified: Secondary | ICD-10-CM | POA: Insufficient documentation

## 2024-06-30 DIAGNOSIS — I10 Essential (primary) hypertension: Secondary | ICD-10-CM

## 2024-06-30 DIAGNOSIS — Z860100 Personal history of colon polyps, unspecified: Secondary | ICD-10-CM | POA: Diagnosis not present

## 2024-06-30 DIAGNOSIS — Z1211 Encounter for screening for malignant neoplasm of colon: Secondary | ICD-10-CM | POA: Diagnosis present

## 2024-06-30 DIAGNOSIS — K219 Gastro-esophageal reflux disease without esophagitis: Secondary | ICD-10-CM | POA: Insufficient documentation

## 2024-06-30 DIAGNOSIS — E039 Hypothyroidism, unspecified: Secondary | ICD-10-CM | POA: Diagnosis not present

## 2024-06-30 HISTORY — PX: COLONOSCOPY: SHX5424

## 2024-06-30 LAB — GLUCOSE, CAPILLARY: Glucose-Capillary: 117 mg/dL — ABNORMAL HIGH (ref 70–99)

## 2024-06-30 SURGERY — COLONOSCOPY
Anesthesia: Monitor Anesthesia Care

## 2024-06-30 MED ORDER — PROPOFOL 500 MG/50ML IV EMUL
INTRAVENOUS | Status: DC | PRN
  Administered 2024-06-30: 135 ug/kg/min via INTRAVENOUS
  Administered 2024-06-30: 80 mg via INTRAVENOUS

## 2024-06-30 MED ORDER — SODIUM CHLORIDE 0.9 % IV SOLN: INTRAVENOUS | Status: DC

## 2024-06-30 NOTE — Discharge Instructions (Signed)

## 2024-06-30 NOTE — Anesthesia Postprocedure Evaluation (Signed)
 Anesthesia Post Note  Patient: Christy Carpenter  Procedure(s) Performed: COLONOSCOPY     Patient location during evaluation: PACU Anesthesia Type: MAC Level of consciousness: awake and alert Pain management: pain level controlled Vital Signs Assessment: post-procedure vital signs reviewed and stable Respiratory status: spontaneous breathing, nonlabored ventilation, respiratory function stable and patient connected to nasal cannula oxygen Cardiovascular status: stable and blood pressure returned to baseline Postop Assessment: no apparent nausea or vomiting Anesthetic complications: no   No notable events documented.  Last Vitals:  Vitals:   06/30/24 0920 06/30/24 0930  BP: 119/64 (!) 128/94  Pulse: 82 84  Resp: (!) 24 18  Temp:    SpO2: 98% 100%    Last Pain:  Vitals:   06/30/24 0930  TempSrc:   PainSc: 0-No pain                 Thom JONELLE Peoples

## 2024-06-30 NOTE — Op Note (Signed)
 [facility] Patient Name: Christy Carpenter Procedure Date: 06/30/2024 MRN: [id number] Attending MD: Belvie Just , MD, [id number] Date of Birth: 10-24-1955 CSN: [id number] Age: 68 Admit Type: Outpatient Procedure:                Colonoscopy Indications:              High risk colon cancer surveillance: Personal                            history of colonic polyps Providers:                Belvie Just, MD, Hoy Penner, RN, Felice Sar,                            Technician Referring MD:             Belvie Just, MD Medicines:                Propofol  per Anesthesia Complications:            No immediate complications. Estimated Blood Loss:     Estimated blood loss: none. Procedure:                Pre-Anesthesia Assessment:                           - Prior to the procedure, a History and Physical                            was performed, and patient medications and                            allergies were reviewed. The patient's tolerance of                            previous anesthesia was also reviewed. The risks                            and benefits of the procedure and the sedation                            options and risks were discussed with the patient.                            All questions were answered, and informed consent                            was obtained. Prior Anticoagulants: The patient has                            taken no anticoagulant or antiplatelet agents. ASA                            Grade Assessment: III - A patient with severe  systemic disease. After reviewing the risks and                            benefits, the patient was deemed in satisfactory                            condition to undergo the procedure.                           - Sedation was administered by an anesthesia                            professional. Deep sedation was attained.                           After obtaining informed  consent, the colonoscope                            was passed under direct vision. Throughout the                            procedure, the patient's blood pressure, pulse, and                            oxygen saturations were monitored continuously. The                            CF-HQ190L ([id number] Olympus colonoscope was                            introduced through the anus and advanced to the the                            cecum, identified by appendiceal orifice and                            ileocecal valve. The colonoscopy was performed                            without difficulty. The patient tolerated the                            procedure well. The quality of the bowel                            preparation was evaluated using the BBPS [facility]                            Bowel Preparation Scale) with scores of: Right                            Colon = 3 (entire mucosa seen well with no residual  staining, small fragments of stool or opaque                            liquid), Transverse Colon = 3 (entire mucosa seen                            well with no residual staining, small fragments of                            stool or opaque liquid) and Left Colon = 2 (minor                            amount of residual staining, small fragments of                            stool and/or opaque liquid, but mucosa seen well).                            The total BBPS score equals 8. The quality of the                            bowel preparation was good. The ileocecal valve,                            appendiceal orifice, and rectum were photographed. Scope In: 8:46:25 AM Scope Out: 9:06:15 AM Scope Withdrawal Time: 0 hours 13 minutes 13 seconds  Total Procedure Duration: 0 hours 19 minutes 50 seconds  Findings:      The colon (entire examined portion) appeared normal. Impression:               - The entire examined colon is normal.                            - No specimens collected. Moderate Sedation:      Not Applicable - Patient had care per Anesthesia. Recommendation:           - Repeat colonoscopy in 7 years for surveillance.                            Her prior colonoscopy was positive for an                            adenoma(s). Procedure Code(s):        --- Professional ---                           [id number], Colonoscopy, flexible; diagnostic, including                            collection of specimen(s) by brushing or washing,                            when performed (separate procedure) Diagnosis Code(s):        --- Professional ---  Z86.010, Personal history of colonic polyps CPT copyright 2022 [employer]. All rights reserved. The codes documented in this report are preliminary and upon coder review may  be revised to meet current compliance requirements. Belvie Just, MD Belvie Just, MD 06/30/2024 9:20:25 AM This report has been signed electronically. Number of Addenda: 0

## 2024-06-30 NOTE — Anesthesia Preprocedure Evaluation (Addendum)
 Anesthesia Evaluation  Patient identified by MRN, date of birth, ID band Patient awake    Reviewed: Allergy & Precautions, H&P , NPO status , Patient's Chart, lab work & pertinent test results  History of Anesthesia Complications Negative for: history of anesthetic complications  Airway Mallampati: II  TM Distance: >3 FB Neck ROM: Full    Dental no notable dental hx.    Pulmonary neg pulmonary ROS   Pulmonary exam normal breath sounds clear to auscultation       Cardiovascular hypertension, (-) angina (-) Past MI Normal cardiovascular exam Rhythm:Regular Rate:Normal     Neuro/Psych neg Seizures negative neurological ROS  negative psych ROS   GI/Hepatic Neg liver ROS,GERD  ,,  Endo/Other  diabetes, Type 2Hypothyroidism    Renal/GU negative Renal ROS   Endometrial adenocarcinoma  negative genitourinary   Musculoskeletal negative musculoskeletal ROS (+)    Abdominal   Peds negative pediatric ROS (+)  Hematology negative hematology ROS (+)   Anesthesia Other Findings   Reproductive/Obstetrics negative OB ROS                              Anesthesia Physical Anesthesia Plan  ASA: 3  Anesthesia Plan: MAC   Post-op Pain Management:    Induction: Intravenous  PONV Risk Score and Plan: 2 and Propofol  infusion and Treatment may vary due to age or medical condition  Airway Management Planned: Natural Airway  Additional Equipment:   Intra-op Plan:   Post-operative Plan:   Informed Consent: I have reviewed the patients History and Physical, chart, labs and discussed the procedure including the risks, benefits and alternatives for the proposed anesthesia with the patient or authorized representative who has indicated his/her understanding and acceptance.     Dental advisory given  Plan Discussed with: CRNA  Anesthesia Plan Comments:          Anesthesia Quick  Evaluation

## 2024-06-30 NOTE — Transfer of Care (Signed)
 Immediate Anesthesia Transfer of Care Note  Patient: Christy Carpenter  Procedure(s) Performed: COLONOSCOPY  Patient Location: PACU  Anesthesia Type:MAC  Level of Consciousness: awake, alert , and oriented  Airway & Oxygen Therapy: Patient Spontanous Breathing  Post-op Assessment: Report given to RN and Post -op Vital signs reviewed and stable  Post vital signs: Reviewed and stable  Last Vitals:  Vitals Value Taken Time  BP 119/64 06/30/24 09:20  Temp 36.8 C 06/30/24 09:16  Pulse 82 06/30/24 09:27  Resp 21 06/30/24 09:27  SpO2 100 % 06/30/24 09:27  Vitals shown include unfiled device data.  Last Pain:  Vitals:   06/30/24 0920  TempSrc:   PainSc: 0-No pain         Complications: No notable events documented.

## 2024-06-30 NOTE — H&P (Signed)
 Christy Carpenter HPI:  This 68 year old black female presents to the office for colorectal cancer screening. She has 4 BM's per week. She takes Linzess 290 mcg as needed. She does not take the Linzess on a daily basis because it causes diarrhea. There is no obvious blood or mucus in the stool. She complains of gas pain in the upper back that started 4 days ago. She takes Pantoprazole  or Tums for acid reflux as needed. She has a good appetite and her weight has been stable. She denies having any complaints of abdominal pain, nausea, vomiting, dysphagia or odynophagia. She denies having a family history of colon cancer, celiac sprue or IBD. Her last colonoscopy was done 05/06/22 when tubular adenomas were removed, preparation of colon was extremely poor. She was advised to have a repeat colonoscopy in 6 months but after several attempted reminders and messages patient did not follow up.  Past Medical History:  Diagnosis Date   Bacteremia 06/28/2020   Cancer (HCC)    endometreal ut   Chest pain 08/19/2017   Diabetes mellitus    Edema of both lower extremities    GERD (gastroesophageal reflux disease)    Hypercholesterolemia    Hypertension    Ileus (HCC)    Neuropathy of both feet    Pelvic fluid collection    Plantar fasciitis    Left foot   SIRS due to infectious process with acute organ dysfunction (HCC) 06/24/2020    Past Surgical History:  Procedure Laterality Date   HAND LIGAMENT RECONSTRUCTION     ROBOTIC ASSISTED TOTAL HYSTERECTOMY WITH BILATERAL SALPINGO OOPHERECTOMY N/A 06/20/2020   Procedure: XI ROBOTIC ASSISTED TOTAL HYSTERECTOMY WITH BILATERAL SALPINGO OOPHORECTOMY, RIGHT URETERAL LYSIS;  Surgeon: Eloy Herring, MD;  Location: [facility];  Service: Gynecology;  Laterality: N/A;   SENTINEL NODE BIOPSY N/A 06/20/2020   Procedure: SENTINEL NODE BIOPSY;  Surgeon: Eloy Herring, MD;  Location: [facility];  Service: Gynecology;  Laterality: N/A;    Family History  Problem Relation Age of Onset    Diabetes Mellitus II Mother    Stroke Mother    Hypertension Mother    Kidney disease Mother    Stroke Brother    Diabetes Mellitus II Brother    Hypertension Brother    Cancer Paternal Aunt        ovarian    Social History:  reports that she has never smoked. She has never used smokeless tobacco. She reports that she does not drink alcohol and does not use drugs.  Allergies:  Allergies  Allergen Reactions   Dapagliflozin  Other (See Comments)    Yeast infections   Exenatide Itching    : Not available, Unknown   Januvia  [Sitagliptin ]     Yeast infections    Invokana [Canagliflozin] Other (See Comments)    Yeast infections    Medications: Scheduled: Continuous:  sodium chloride       Results for orders placed or performed during the hospital encounter of 06/30/24 (from the past 24 hours)  Glucose, capillary     Status: Abnormal   Collection Time: 06/30/24  7:59 AM  Result Value Ref Range   Glucose-Capillary 117 (H) 70 - 99 mg/dL     No results found.  ROS:  As stated above in the HPI otherwise negative.  Blood pressure (!) 185/79, pulse 88, temperature (!) 97.5 F (36.4 C), temperature source Temporal, resp. rate 17, height 5' (1.524 m), weight 90.3 kg, SpO2 99%.    PE: Gen: NAD, Alert  and Oriented HEENT:  [place]/AT, EOMI Neck: Supple, no LAD Lungs: CTA Bilaterally CV: RRR without M/G/R ABD: Soft, NTND, +BS Ext: No C/C/E  Assessment/Plan: 1) Personal history of polyps - colonoscopy.  Jhonny Calixto D 06/30/2024, 8:26 AM

## 2024-07-02 ENCOUNTER — Encounter (HOSPITAL_COMMUNITY): Payer: Self-pay | Admitting: Gastroenterology
